# Patient Record
Sex: Male | Born: 1940
Health system: Southern US, Community
[De-identification: ages and names within clinical notes are randomized; demographics above are authoritative.]

## PROBLEM LIST (undated history)

## (undated) DIAGNOSIS — I10 Essential (primary) hypertension: Secondary | ICD-10-CM

## (undated) DIAGNOSIS — Z9889 Other specified postprocedural states: Secondary | ICD-10-CM

## (undated) DIAGNOSIS — IMO0001 Reserved for inherently not codable concepts without codable children: Secondary | ICD-10-CM

## (undated) DIAGNOSIS — E785 Hyperlipidemia, unspecified: Secondary | ICD-10-CM

## (undated) DIAGNOSIS — J449 Chronic obstructive pulmonary disease, unspecified: Secondary | ICD-10-CM

## (undated) HISTORY — DX: Essential (primary) hypertension: I10

## (undated) HISTORY — DX: Other specified postprocedural states: Z98.890

## (undated) HISTORY — DX: Hyperlipidemia, unspecified: E78.5

## (undated) HISTORY — DX: Reserved for inherently not codable concepts without codable children: IMO0001

---

## 2007-10-20 ENCOUNTER — Ambulatory Visit: Payer: Self-pay | Admitting: Family Medicine

## 2007-10-20 ENCOUNTER — Encounter: Admission: RE | Admit: 2007-10-20 | Discharge: 2007-10-20 | Payer: Self-pay | Admitting: Family Medicine

## 2007-10-20 DIAGNOSIS — H9190 Unspecified hearing loss, unspecified ear: Secondary | ICD-10-CM | POA: Insufficient documentation

## 2007-10-20 DIAGNOSIS — J441 Chronic obstructive pulmonary disease with (acute) exacerbation: Secondary | ICD-10-CM

## 2007-10-20 DIAGNOSIS — I10 Essential (primary) hypertension: Secondary | ICD-10-CM

## 2007-10-24 ENCOUNTER — Encounter: Payer: Self-pay | Admitting: Family Medicine

## 2007-10-24 LAB — CONVERTED CEMR LAB
Alkaline Phosphatase: 88 units/L (ref 39–117)
BUN: 13 mg/dL (ref 6–23)
Glucose, Bld: 90 mg/dL (ref 70–99)
HDL goal, serum: 40 mg/dL
HDL: 71 mg/dL (ref >39)
LDL Cholesterol: 119 mg/dL — ABNORMAL HIGH (ref 0–99)
Total Bilirubin: 0.4 mg/dL (ref 0.3–1.2)
Total CHOL/HDL Ratio: 3.1
Triglycerides: 140 mg/dL (ref <150)
VLDL: 28 mg/dL (ref 0–40)

## 2007-11-06 ENCOUNTER — Ambulatory Visit: Payer: Self-pay | Admitting: Family Medicine

## 2007-12-26 ENCOUNTER — Ambulatory Visit: Payer: Self-pay | Admitting: Family Medicine

## 2007-12-26 DIAGNOSIS — F17200 Nicotine dependence, unspecified, uncomplicated: Secondary | ICD-10-CM

## 2008-01-29 ENCOUNTER — Ambulatory Visit: Payer: Self-pay | Admitting: Family Medicine

## 2008-04-11 ENCOUNTER — Ambulatory Visit: Payer: Self-pay | Admitting: Family Medicine

## 2008-05-30 ENCOUNTER — Ambulatory Visit: Payer: Self-pay | Admitting: Family Medicine

## 2008-05-31 ENCOUNTER — Encounter: Payer: Self-pay | Admitting: Family Medicine

## 2008-05-31 LAB — CONVERTED CEMR LAB
AST: 16 units/L (ref 0–37)
BUN: 12 mg/dL (ref 6–23)
Basophils Relative: 1 % (ref 0–1)
Calcium: 8.9 mg/dL (ref 8.4–10.5)
Chloride: 99 meq/L (ref 96–112)
Creatinine, Ser: 0.8 mg/dL (ref 0.40–1.50)
Hemoglobin: 15 g/dL (ref 13.0–17.0)
MCHC: 32.3 g/dL (ref 30.0–36.0)
MCV: 90.8 fL (ref 78.0–100.0)
Monocytes Absolute: 0.8 10*3/uL (ref 0.1–1.0)
Monocytes Relative: 14 % — ABNORMAL HIGH (ref 3–12)
Neutro Abs: 2.7 10*3/uL (ref 1.7–7.7)
RBC: 5.11 M/uL (ref 4.22–5.81)

## 2008-07-04 ENCOUNTER — Ambulatory Visit: Payer: Self-pay | Admitting: Family Medicine

## 2008-08-05 ENCOUNTER — Ambulatory Visit: Payer: Self-pay | Admitting: Family Medicine

## 2008-11-19 ENCOUNTER — Telehealth: Payer: Self-pay | Admitting: Family Medicine

## 2008-12-05 ENCOUNTER — Ambulatory Visit: Payer: Self-pay | Admitting: Family Medicine

## 2008-12-05 DIAGNOSIS — R413 Other amnesia: Secondary | ICD-10-CM | POA: Insufficient documentation

## 2008-12-06 LAB — CONVERTED CEMR LAB
CO2: 24 meq/L (ref 19–32)
Chloride: 104 meq/L (ref 96–112)
Creatinine, Ser: 0.91 mg/dL (ref 0.40–1.50)
HDL: 71 mg/dL (ref >39)
LDL Cholesterol: 114 mg/dL — ABNORMAL HIGH (ref 0–99)
Potassium: 4.8 meq/L (ref 3.5–5.3)
Sodium: 141 meq/L (ref 135–145)
Total CHOL/HDL Ratio: 2.9
VLDL: 20 mg/dL (ref 0–40)

## 2009-01-31 ENCOUNTER — Telehealth (INDEPENDENT_AMBULATORY_CARE_PROVIDER_SITE_OTHER): Payer: Self-pay | Admitting: *Deleted

## 2009-02-18 ENCOUNTER — Telehealth (INDEPENDENT_AMBULATORY_CARE_PROVIDER_SITE_OTHER): Payer: Self-pay | Admitting: *Deleted

## 2009-04-02 ENCOUNTER — Ambulatory Visit: Payer: Self-pay | Admitting: Family Medicine

## 2009-04-08 ENCOUNTER — Encounter: Admission: RE | Admit: 2009-04-08 | Discharge: 2009-04-08 | Payer: Self-pay | Admitting: Family Medicine

## 2009-04-08 ENCOUNTER — Encounter: Payer: Self-pay | Admitting: Family Medicine

## 2009-06-01 ENCOUNTER — Encounter: Payer: Self-pay | Admitting: Family Medicine

## 2009-06-04 ENCOUNTER — Encounter: Payer: Self-pay | Admitting: Family Medicine

## 2009-06-09 ENCOUNTER — Ambulatory Visit: Payer: Self-pay | Admitting: Family Medicine

## 2009-10-09 ENCOUNTER — Encounter: Payer: Self-pay | Admitting: Family Medicine

## 2009-10-23 ENCOUNTER — Ambulatory Visit: Payer: Self-pay | Admitting: Family Medicine

## 2009-12-31 ENCOUNTER — Ambulatory Visit: Payer: Self-pay | Admitting: Family Medicine

## 2010-01-01 LAB — CONVERTED CEMR LAB
ALT: 10 units/L (ref 0–53)
AST: 19 units/L (ref 0–37)
CO2: 27 meq/L (ref 19–32)
Calcium: 9.1 mg/dL (ref 8.4–10.5)
Chloride: 98 meq/L (ref 96–112)
Magnesium: 2.1 mg/dL (ref 1.5–2.5)
Platelets: 159 10*3/uL (ref 150–400)
Potassium: 4.7 meq/L (ref 3.5–5.3)
Sodium: 135 meq/L (ref 135–145)
Total Protein: 6.9 g/dL (ref 6.0–8.3)
WBC: 5.3 10*3/uL (ref 4.0–10.5)

## 2010-03-12 ENCOUNTER — Telehealth (INDEPENDENT_AMBULATORY_CARE_PROVIDER_SITE_OTHER): Payer: Self-pay | Admitting: *Deleted

## 2010-04-15 ENCOUNTER — Ambulatory Visit: Payer: Self-pay | Admitting: Family Medicine

## 2010-04-15 ENCOUNTER — Telehealth (INDEPENDENT_AMBULATORY_CARE_PROVIDER_SITE_OTHER): Payer: Self-pay | Admitting: *Deleted

## 2010-04-15 DIAGNOSIS — E785 Hyperlipidemia, unspecified: Secondary | ICD-10-CM | POA: Insufficient documentation

## 2010-07-15 ENCOUNTER — Ambulatory Visit: Payer: Self-pay | Admitting: Family Medicine

## 2010-07-15 DIAGNOSIS — J309 Allergic rhinitis, unspecified: Secondary | ICD-10-CM | POA: Insufficient documentation

## 2010-07-17 ENCOUNTER — Encounter: Payer: Self-pay | Admitting: Family Medicine

## 2010-07-28 ENCOUNTER — Ambulatory Visit: Payer: Self-pay | Admitting: Family Medicine

## 2010-09-29 NOTE — Assessment & Plan Note (Signed)
Summary: COPD, hypertension   Vital Signs:  Patient profile:   70 year old male Height:      71.5 inches Weight:      136 pounds BMI:     18.77 Pulse rate:   67 / minute BP sitting:   159 / 83  (right arm) Cuff size:   regular  Vitals Entered By: Avon Gully CMA, Duncan Dull) (July 28, 2010 10:35 AM) CC: Hosp f/u   Primary Care Provider:  Linford Arnold, C  CC:  Hosp f/u.  History of Present Illness: is 70 year old male he went to emergency department for shortness of breath.  He was diagnosed with an acute COPD exacerbation.  He was told he likely had early bronchitis.  He was started on Levaquin and a steroid.  His regular COPD medications were continued.  Other medications were adjusted.  His blood pressure with a little elevated there as well.  He says overall he is feeling much better.  He still feels his allergies are a problem.  He is not nearly as shortness of breath.  He wonders if he would be a candidate for lung transplant.  Current Medications (verified): 1)  Albuterol Sulfate (2.5 Mg/68ml) 0.083%  Nebu (Albuterol Sulfate) .... Use in Nebulizer Machine Once A Day 2)  Lisinopril 20 Mg Tabs (Lisinopril) .... Take 1 Tablet By Mouth Two Times A Day 3)  Proair Hfa 108 (90 Base) Mcg/act  Aers (Albuterol Sulfate) .... 2 Puffs Inhaled Two Times A Day As Needed 4)  Advair Diskus 250-50 Mcg/dose Misc (Fluticasone-Salmeterol) .Marland Kitchen.. 1 Puff Inhaled Two Times A Day 5)  Spiriva Handihaler 18 Mcg  Caps (Tiotropium Bromide Monohydrate) .... Inhale One Puff Daily 6)  Metoprolol Tartrate 50 Mg Tabs (Metoprolol Tartrate) .... Take 1 Tablet By Mouth Two Times A Day  Allergies (verified): No Known Drug Allergies  Comments:  Nurse/Medical Assistant: The patient's medications and allergies were reviewed with the patient and were updated in the Medication and Allergy Lists. Avon Gully CMA, Duncan Dull) (July 28, 2010 10:36 AM)  Physical Exam  General:  Well-developed,well-nourished,in no  acute distress; alert,appropriate and cooperative throughout examination Head:  Normocephalic and atraumatic without obvious abnormalities. No apparent alopecia or balding. Eyes:  No corneal or conjunctival inflammation noted. EOMI. Perrla.  Ears:  External ear exam shows no significant lesions or deformities.  Otoscopic examination reveals clear canals, tympanic membranes are intact bilaterally without bulging, retraction, inflammation or discharge. Hearing is grossly normal bilaterally. Nose:  External nasal examination shows no deformity or inflammation.  Mouth:  Oral mucosa and oropharynx without lesions or exudates.  Teeth in good repair. Lungs:  coarse breath sounds diffusely.  No wheezing today. Heart:  Normal rate and regular rhythm. S1 and S2 normal without gallop, murmur, click, rub or other extra sounds. Skin:  no rashes.   Cervical Nodes:  No lymphadenopathy noted Psych:  Cognition and judgment appear intact. Alert and cooperative with normal attention span and concentration. No apparent delusions, illusions, hallucinations   Impression & Recommendations:  Problem # 1:  COPD (ICD-496) I think he is improving from his exacerbation.  His lung exam really is at baseline today.  Continue antibiotics and complete his steroid regimen.  Continue his Advair and sprays as well as his p.r.n. albuterol.  Also continue his allergy medications.  He is due for some blood work but says he is not fasting today and would like to do some other time.I did review his ED visit summary. His updated medication list for this  problem includes:    Albuterol Sulfate (2.5 Mg/43ml) 0.083% Nebu (Albuterol sulfate) ..... Use in nebulizer machine once a day    Proair Hfa 108 (90 Base) Mcg/act Aers (Albuterol sulfate) .Marland Kitchen... 2 puffs inhaled two times a day as needed    Advair Diskus 250-50 Mcg/dose Misc (Fluticasone-salmeterol) .Marland Kitchen... 1 puff inhaled two times a day    Spiriva Handihaler 18 Mcg Caps (Tiotropium bromide  monohydrate) ..... Inhale one puff daily  Problem # 2:  HYPERTENSION, BENIGN (ICD-401.1) his blood pressure is not well-controlled today.  I will a low dose of hydralazine to his current regimen.  Have him follow-up in one month to recheck his blood pressure.  We can likely due his blood work at that time. His updated medication list for this problem includes:    Lisinopril 20 Mg Tabs (Lisinopril) .Marland Kitchen... Take 1 tablet by mouth two times a day    Metoprolol Tartrate 50 Mg Tabs (Metoprolol tartrate) .Marland Kitchen... Take 1 tablet by mouth two times a day    Hydralazine Hcl 25 Mg Tabs (Hydralazine hcl) .Marland Kitchen... Take 1 tablet by mouth once a day  Complete Medication List: 1)  Albuterol Sulfate (2.5 Mg/25ml) 0.083% Nebu (Albuterol sulfate) .... Use in nebulizer machine once a day 2)  Lisinopril 20 Mg Tabs (Lisinopril) .... Take 1 tablet by mouth two times a day 3)  Proair Hfa 108 (90 Base) Mcg/act Aers (Albuterol sulfate) .... 2 puffs inhaled two times a day as needed 4)  Advair Diskus 250-50 Mcg/dose Misc (Fluticasone-salmeterol) .Marland Kitchen.. 1 puff inhaled two times a day 5)  Spiriva Handihaler 18 Mcg Caps (Tiotropium bromide monohydrate) .... Inhale one puff daily 6)  Metoprolol Tartrate 50 Mg Tabs (Metoprolol tartrate) .... Take 1 tablet by mouth two times a day 7)  Hydralazine Hcl 25 Mg Tabs (Hydralazine hcl) .... Take 1 tablet by mouth once a day  Patient Instructions: 1)  Please schedule a follow-up appointment in 1 month for blood pressure.  Prescriptions: HYDRALAZINE HCL 25 MG TABS (HYDRALAZINE HCL) Take 1 tablet by mouth once a day  #30 x 1   Entered and Authorized by:   Nani Gasser MD   Signed by:   Nani Gasser MD on 07/28/2010   Method used:   Electronically to        [facility] # [phone]* (retail)       [address]       [place], [place]  [address]       Ph: [phone]       Fax: [phone]   RxID:   [id number]    Orders Added: 1)  Est. Patient  Level IV [36644]

## 2010-09-29 NOTE — Progress Notes (Signed)
----   Converted from flag ---- ---- 04/15/2010 12:24 PM, Nani Gasser MD wrote: Call pt and let him know he is due for lab. I printed them off. Sorry I din't give them to him this AM. ------------------------------  8/17/111:18 called pt and left a message on vm

## 2010-09-29 NOTE — Assessment & Plan Note (Signed)
Summary: AR, HTN   Vital Signs:  Patient profile:   70 year old male Height:      71.5 inches Weight:      131 pounds Pulse rate:   80 / minute BP sitting:   140 / 82  (right arm) Cuff size:   regular  Vitals Entered By: Avon Gully CMA, Duncan Dull) (July 15, 2010 10:41 AM) CC: f/u BP and pneumo vaccine, runny nose and itchy nose   Primary Care Provider:  Linford Arnold, C  CC:  f/u BP and pneumo vaccine and runny nose and itchy nose.  History of Present Illness: f/u BP and pneumo vaccine, runny nose and itchy nose. Taking an OTC allergy medicine and helps some.  He is not sure of the name. Says it is a very small white pill.  Sxs x 2 weeks.  No fever.     Current Medications (verified): 1)  Albuterol Sulfate (2.5 Mg/85ml) 0.083%  Nebu (Albuterol Sulfate) .... Use in Nebulizer Machine Once A Day 2)  Lisinopril 20 Mg Tabs (Lisinopril) .... Take 1 Tablet By Mouth Two Times A Day 3)  Proair Hfa 108 (90 Base) Mcg/act  Aers (Albuterol Sulfate) .... 2 Puffs Inhaled Two Times A Day As Needed 4)  Advair Diskus 250-50 Mcg/dose Misc (Fluticasone-Salmeterol) .Marland Kitchen.. 1 Puff Inhaled Two Times A Day 5)  Spiriva Handihaler 18 Mcg  Caps (Tiotropium Bromide Monohydrate) .... Inhale One Puff Daily 6)  Metoprolol Tartrate 50 Mg Tabs (Metoprolol Tartrate) .... Take 1 Tablet By Mouth Two Times A Day  Allergies (verified): No Known Drug Allergies  Comments:  Nurse/Medical Assistant: The patient's medications and allergies were reviewed with the patient and were updated in the Medication and Allergy Lists. Avon Gully CMA, Duncan Dull) (July 15, 2010 10:42 AM)  Past History:  Social History: Last updated: 10/20/2007 REtired.  Completed high school.  Uses a wood burning stove.  Current Smoker Alcohol use-no Drug use-no Regular exercise-no  Physical Exam  General:  Well-developed,well-nourished,in no acute distress; alert,appropriate and cooperative throughout examination Lungs:  Normal  respiratory effort, chest expands symmetrically. Corse BS bilar. No wheezing or rhonchi.  Heart:  Normal rate and regular rhythm. S1 and S2 normal without gallop, murmur, click, rub or other extra sounds. Skin:  no rashes.   Cervical Nodes:  No lymphadenopathy noted Psych:  Cognition and judgment appear intact. Alert and cooperative with normal attention span and concentration. No apparent delusions, illusions, hallucinations   Impression & Recommendations:  Problem # 1:  ALLERGIC RHINITIS (ICD-477.9) Continue med from [employer] and given a sample of astepro. Start with once a day. Can inc to two times a day  Call if he has breathing difficuty.  Discussed use of allergy medications and environmental measures.   Problem # 2:  HYPERTENSION, BENIGN (ICD-401.1) AT goal based on his age.  His updated medication list for this problem includes:    Lisinopril 20 Mg Tabs (Lisinopril) .Marland Kitchen... Take 1 tablet by mouth two times a day    Metoprolol Tartrate 50 Mg Tabs (Metoprolol tartrate) .Marland Kitchen... Take 1 tablet by mouth two times a day  BP today: 140/82 Prior BP: 141/73 (04/15/2010)  Prior 10 Yr Risk Heart Disease: 33 % (04/15/2010)  Labs Reviewed: K+: 4.7 (12/31/2009) Creat: : 0.80 (12/31/2009)   Chol: 205 (12/05/2008)   HDL: 71 (12/05/2008)   LDL: 114 (12/05/2008)   TG: 99 (12/05/2008)  Complete Medication List: 1)  Albuterol Sulfate (2.5 Mg/33ml) 0.083% Nebu (Albuterol sulfate) .... Use in nebulizer machine once a day  2)  Lisinopril 20 Mg Tabs (Lisinopril) .... Take 1 tablet by mouth two times a day 3)  Proair Hfa 108 (90 Base) Mcg/act Aers (Albuterol sulfate) .... 2 puffs inhaled two times a day as needed 4)  Advair Diskus 250-50 Mcg/dose Misc (Fluticasone-salmeterol) .Marland Kitchen.. 1 puff inhaled two times a day 5)  Spiriva Handihaler 18 Mcg Caps (Tiotropium bromide monohydrate) .... Inhale one puff daily 6)  Metoprolol Tartrate 50 Mg Tabs (Metoprolol tartrate) .... Take 1 tablet by mouth two times a  day  Other Orders: Pneumococcal Vaccine (60454) Admin 1st Vaccine (09811)  Patient Instructions: 1)  Astepro : 1 spray in each nostril once a day. Keep taking the allergy pill from [facility] too.   2)  If not helping your allergies then let me know.   Contraindications/Deferment of Procedures/Staging:    Test/Procedure: FLU VAX    Reason for deferment: patient declined    Orders Added: 1)  Pneumococcal Vaccine [90732] 2)  Admin 1st Vaccine [90471] 3)  Est. Patient Level III [91478]   Immunizations Administered:  Pneumonia Vaccine:    Vaccine Type: Pneumovax (Medicare)    Site: left deltoid    Mfr: Merck    Dose: 0.5 ml    Route: IM    Given by: Sue Lush McCrimmon CMA, (AAMA)    Exp. Date: 11/09/2011    Lot #: [id number]    VIS given: 08/04/09 version given July 15, 2010.   Immunizations Administered:  Pneumonia Vaccine:    Vaccine Type: Pneumovax (Medicare)    Site: left deltoid    Mfr: Merck    Dose: 0.5 ml    Route: IM    Given by: Sue Lush McCrimmon CMA, (AAMA)    Exp. Date: 11/09/2011    Lot #: [id number]    VIS given: 08/04/09 version given July 15, 2010.  Appended Document: AR, HTN     Contraindications/Deferment of Procedures/Staging:    Test/Procedure: FLU VAX    Reason for deferment: patient declined

## 2010-09-29 NOTE — Letter (Signed)
Summary: [facility]  [facility]   Imported By: Maryln Gottron 08/06/2010 14:57:55  _____________________________________________________________________  External Attachment:    Type:   Image     Comment:   External Document

## 2010-09-29 NOTE — Assessment & Plan Note (Signed)
Summary: FU HTN, COPD   Vital Signs:  Patient profile:   70 year old male Height:      71.5 inches Weight:      130 pounds Pulse rate:   62 / minute BP sitting:   141 / 73  (left arm) Cuff size:   regular  Vitals Entered By: Avon Gully CMA, Duncan Dull) (April 15, 2010 9:55 AM) CC: f/u BP, Hypertension Management   Primary Care Provider:  Linford Arnold, C  CC:  f/u BP and Hypertension Management.  History of Present Illness: Says he thinks Adviar is causing loose stools but not sure. Says he read it on teh side effect profil. Says his COPD has been under control lately. Has had some flares with the heat this summer.  Hypertension History:      He denies headache, chest pain, palpitations, dyspnea with exertion, orthopnea, PND, peripheral edema, visual symptoms, neurologic problems, syncope, and side effects from treatment.  He notes no problems with any antihypertensive medication side effects.        Positive major cardiovascular risk factors include male age 56 years old or older, hyperlipidemia, hypertension, and current tobacco user.  Negative major cardiovascular risk factors include no history of diabetes and negative family history for ischemic heart disease.        Further assessment for target organ damage reveals no history of ASHD, stroke/TIA, or peripheral vascular disease.     Current Medications (verified): 1)  Albuterol Sulfate (2.5 Mg/25ml) 0.083%  Nebu (Albuterol Sulfate) .... Use in Nebulizer Machine Once A Day 2)  Lisinopril 20 Mg Tabs (Lisinopril) .... Take 1 Tablet By Mouth Two Times A Day 3)  Proair Hfa 108 (90 Base) Mcg/act  Aers (Albuterol Sulfate) .... 2 Puffs Inhaled Two Times A Day As Needed 4)  Advair Diskus 250-50 Mcg/dose Misc (Fluticasone-Salmeterol) .Marland Kitchen.. 1 Puff Inhaled Two Times A Day 5)  Spiriva Handihaler 18 Mcg  Caps (Tiotropium Bromide Monohydrate) .... Inhale One Puff Daily 6)  Metoprolol Tartrate 50 Mg Tabs (Metoprolol Tartrate) .... Take 1 Tablet  By Mouth Two Times A Day  Allergies (verified): No Known Drug Allergies  Comments:  Nurse/Medical Assistant: The patient's medications and allergies were reviewed with the patient and were updated in the Medication and Allergy Lists. Avon Gully CMA, Duncan Dull) (April 15, 2010 9:56 AM)  Physical Exam  General:  Well-developed,well-nourished,in no acute distress; alert,appropriate and cooperative throughout examination. Thin frame Lungs:  Normal respiratory effort, chest expands symmetrically. Lungs are clear to auscultation, no crackles or wheezes. Heart:  Normal rate and regular rhythm. S1 and S2 normal without gallop, murmur, click, rub or other extra sounds. Psych:  Cognition and judgment appear intact. Alert and cooperative with normal attention span and concentration. No apparent delusions, illusions, hallucinations   Impression & Recommendations:  Problem # 1:  HYPERTENSION, BENIGN (ICD-401.1)  IMproved ont eh higher dose of metoprolol  This BP is acceptable based on his age.  f/u in 3 months His updated medication list for this problem includes:    Lisinopril 20 Mg Tabs (Lisinopril) .Marland Kitchen... Take 1 tablet by mouth two times a day    Metoprolol Tartrate 50 Mg Tabs (Metoprolol tartrate) .Marland Kitchen... Take 1 tablet by mouth two times a day  BP today: 141/73 Prior BP: 152/69 (12/31/2009)  Prior 10 Yr Risk Heart Disease: 22 % (12/05/2008)  Labs Reviewed: K+: 4.7 (12/31/2009) Creat: : 0.80 (12/31/2009)   Chol: 205 (12/05/2008)   HDL: 71 (12/05/2008)   LDL: 114 (12/05/2008)   TG:  99 (12/05/2008)  Orders: T-Lipid Profile ([address]-60454) T-Comprehensive Metabolic Panel ([address]11-91478)  Problem # 2:  COPD (ICD-496) Sample of symbicort given to use instead of Adviar to see if helps his bowel situation. If not also consider other causes of his diarrhea. f/uin teh fall for flu vaccine.  His updated medication list for this problem includes:    Albuterol Sulfate (2.5 Mg/38ml) 0.083% Nebu  (Albuterol sulfate) ..... Use in nebulizer machine once a day    Proair Hfa 108 (90 Base) Mcg/act Aers (Albuterol sulfate) .Marland Kitchen... 2 puffs inhaled two times a day as needed    Advair Diskus 250-50 Mcg/dose Misc (Fluticasone-salmeterol) .Marland Kitchen... 1 puff inhaled two times a day    Spiriva Handihaler 18 Mcg Caps (Tiotropium bromide monohydrate) ..... Inhale one puff daily  Complete Medication List: 1)  Albuterol Sulfate (2.5 Mg/15ml) 0.083% Nebu (Albuterol sulfate) .... Use in nebulizer machine once a day 2)  Lisinopril 20 Mg Tabs (Lisinopril) .... Take 1 tablet by mouth two times a day 3)  Proair Hfa 108 (90 Base) Mcg/act Aers (Albuterol sulfate) .... 2 puffs inhaled two times a day as needed 4)  Advair Diskus 250-50 Mcg/dose Misc (Fluticasone-salmeterol) .Marland Kitchen.. 1 puff inhaled two times a day 5)  Spiriva Handihaler 18 Mcg Caps (Tiotropium bromide monohydrate) .... Inhale one puff daily 6)  Metoprolol Tartrate 50 Mg Tabs (Metoprolol tartrate) .... Take 1 tablet by mouth two times a day  Hypertension Assessment/Plan:      The patient's hypertensive risk group is category B: At least one risk factor (excluding diabetes) with no target organ damage.  His calculated 10 year risk of coronary heart disease is 33 %.  Today's blood pressure is 141/73.    Patient Instructions: 1)  Stop the the Advair and start the symbicort. 1 puff inhaled two times a day  2)  Return in october or November for the flu shot and to recheck your blood pressure.

## 2010-09-29 NOTE — Assessment & Plan Note (Signed)
Summary: Elevated BP last 3 weeks.    Vital Signs:  Patient profile:   70 year old male Height:      71.5 inches Weight:      137 pounds O2 Sat:      95 % on Room air Pulse rate:   85 / minute BP sitting:   152 / 69  (left arm) Cuff size:   regular  Vitals Entered By: Kathlene November (Dec 31, 2009 10:23 AM)  O2 Flow:  Room air CC: states BP has been elevated last 3 weeks   Primary Care Provider:  Linford Arnold, C  CC:  states BP has been elevated last 3 weeks.  History of Present Illness: states BP has been elevated last 3 weeks. Not sure why. No changes in his medication. Only change in his diet is has been drinking 2 Ensure a day. No recent change in his COPD.  NOrmally BP is well controlled. He wants to know the normal range for BP adn HR for a man his age.    Current Medications (verified): 1)  Albuterol Sulfate (2.5 Mg/3ml) 0.083%  Nebu (Albuterol Sulfate) .... Use in Nebulizer Machine Once A Day 2)  Lisinopril 20 Mg Tabs (Lisinopril) .... Take 1 Tablet By Mouth Two Times A Day 3)  Proair Hfa 108 (90 Base) Mcg/act  Aers (Albuterol Sulfate) .... 2 Puffs Inhaled Two Times A Day As Needed 4)  Advair Diskus 250-50 Mcg/dose Misc (Fluticasone-Salmeterol) .Marland Kitchen.. 1 Puff Inhaled Two Times A Day 5)  Spiriva Handihaler 18 Mcg  Caps (Tiotropium Bromide Monohydrate) .... Inhale One Puff Daily 6)  Metoprolol Tartrate 25 Mg Tabs (Metoprolol Tartrate) .... Take 1 Tablet By Mouth Two Times A Day  Allergies (verified): No Known Drug Allergies  Comments:  Nurse/Medical Assistant: The patient's medications and allergies were reviewed with the patient and were updated in the Medication and Allergy Lists. Kathlene November (Dec 31, 2009 10:24 AM)  Physical Exam  General:  Well-developed,well-nourished,in no acute distress; alert,appropriate and cooperative throughout examination Head:  Normocephalic and atraumatic without obvious abnormalities. No apparent alopecia or balding. Lungs:  Normal respiratory  effort, chest expands symmetrically. Lungs are clear to auscultation, no crackles or wheezes. Heart:  Normal rate and regular rhythm. S1 and S2 normal without gallop, murmur, click, rub or other extra sounds. No carotid or abdominal bruits.  Abdomen:  Bowel sounds positive,abdomen soft and non-tender without masses, organomegaly or hernias noted. Skin:  no rashes.   Psych:  Cognition and judgment appear intact. Alert and cooperative with normal attention span and concentration. No apparent delusions, illusions, hallucinations   Impression & Recommendations:  Problem # 1:  HYPERTENSION, BENIGN (ICD-401.1) After reviewed his chart , I did see that his HCT was stopped in the hospital so this is the likelyh cause of his elevated. It was stopped because he was midly dehydrated.  Will increase his metoprolol since his HR is in the 80-90s most of the time. F/U for BP check in one month.   EKG shows NSR with rate 93 bpm with rightward axis deviation. He denies any CP.   His updated medication list for this problem includes:    Lisinopril 20 Mg Tabs (Lisinopril) .Marland Kitchen... Take 1 tablet by mouth two times a day    Metoprolol Tartrate 50 Mg Tabs (Metoprolol tartrate) .Marland Kitchen... Take 1 tablet by mouth two times a day  Orders: Prescription Created Electronically [id number]) T-Comprehensive Metabolic Panel [phone]) T-TSH [id number]) EKG w/ Interpretation (93000)  Complete Medication List: 1)  Albuterol Sulfate (2.5 Mg/49ml) 0.083% Nebu (Albuterol sulfate) .... Use in nebulizer machine once a day 2)  Lisinopril 20 Mg Tabs (Lisinopril) .... Take 1 tablet by mouth two times a day 3)  Proair Hfa 108 (90 Base) Mcg/act Aers (Albuterol sulfate) .... 2 puffs inhaled two times a day as needed 4)  Advair Diskus 250-50 Mcg/dose Misc (Fluticasone-salmeterol) .Marland Kitchen.. 1 puff inhaled two times a day 5)  Spiriva Handihaler 18 Mcg Caps (Tiotropium bromide monohydrate) .... Inhale one puff daily 6)  Metoprolol Tartrate 50 Mg Tabs  (Metoprolol tartrate) .... Take 1 tablet by mouth two times a day  Other Orders: T-CBC No Diff ([id number]) T-Vitamin B12 ([id number]) T-Magnesium ([id number])  Patient Instructions: 1)  Normal blood pressure for a man your age is 120-140/70-90. 2)  Normal Heart rate is between 60-100 beats per minute. 3)  Normal breathing rate is 12-20 breaths per minutes.  4)  I sent over a new prescription for your metoprolol as I have increased the dose.  5)  Please schedule a follow-up appointment in 1 month to recheck your blood pressure. 6)  You are due for labs work. Has been one year. Please go today for that.   Prescriptions: METOPROLOL TARTRATE 50 MG TABS (METOPROLOL TARTRATE) Take 1 tablet by mouth two times a day  #60 x 4   Entered and Authorized by:   Nani Gasser MD   Signed by:   Nani Gasser MD on 12/31/2009   Method used:   Electronically to        [facility] # [phone]* (retail)       [address]       [place], [place]  [address]       Ph: [phone]       Fax: [phone]   RxID:   [id number]

## 2010-09-29 NOTE — Progress Notes (Signed)
Summary: refill request  Phone Note Refill Request Message from:  Patient on March 12, 2010 9:00 AM  Refills Requested: Medication #1:  PROAIR HFA 108 (90 BASE) MCG/ACT  AERS 2 puffs inhaled two times a day as needed   Dosage confirmed as above?Dosage Confirmed Please call in to [facility] in [facility] [address]  Initial call taken by: Michaelle Copas,  March 12, 2010 9:03 AM    Prescriptions: PROAIR HFA 108 (90 BASE) MCG/ACT  AERS (ALBUTEROL SULFATE) 2 puffs inhaled two times a day as needed  #2 x 2   Entered by:   Payton Spark CMA   Authorized by:   Nani Gasser MD   Signed by:   Payton Spark CMA on 03/12/2010   Method used:   Electronically to        [facility] # [phone]* (retail)       [address]       [place], [place]  [address]       Ph: [phone]       Fax: [phone]   RxID:   [id number]

## 2010-09-29 NOTE — Assessment & Plan Note (Signed)
Summary: FU FROM HOSPITAL   Vital Signs:  Patient profile:   70 year old male Height:      71.5 inches Weight:      137.04 pounds BMI:     18.91 O2 Sat:      100 % on Room air Temp:     97.1 degrees F oral Pulse rate:   94 / minute Pulse rhythm:   regular BP sitting:   129 / 75 Cuff size:   regular  Vitals Entered By: Kern Reap CMA Duncan Dull) (October 23, 2009 10:47 AM)  O2 Flow:  Room air CC: [facility] Is Patient Diabetic? No Pain Assessment Patient in pain? no      Comments patient is taking lisinopril 20 two times a day and has concerns with this rx   Primary Care Provider:  Linford Arnold, C  CC:  [facility].  History of Present Illness: Was in the hosptial for the flu in early FEb for the "flu".  Says he was admitted to [facility] for 3 days.  Says feels much better.  Was given new rx for lisinopril as the HCTZ was stoppped.   BPS have been really good on the increased Lisinopril to two times a day. Has quit smoking since October 2011. Donig well with this. Says he thinks he may live to 90. REviewed [facility] form [facility]> Was admitted for viral gastroenteritis and dehydration. HCTZ stopped secondary to hypnatremi and lisinopril increased.   Allergies (verified): No Known Drug Allergies  Physical Exam  General:  Well-developed,well-nourished,in no acute distress; alert,appropriate and cooperative throughout examination Head:  Normocephalic and atraumatic without obvious abnormalities. No apparent alopecia or balding. Lungs:  Normal respiratory effort, chest expands symmetrically. Lungs are clear to auscultation, no crackles or wheezes. Heart:  Normal rate and regular rhythm. S1 and S2 normal without gallop, murmur, click, rub or other extra sounds. Skin:  no rashes.   Cervical Nodes:  No lymphadenopathy noted Psych:  Cognition and judgment appear intact. Alert and cooperative with normal attention span and concentration. No apparent delusions,  illusions, hallucinations   Impression & Recommendations:  Problem # 1:  HYPERTENSION, BENIGN (ICD-401.1) BP looks great today. Will continue current regime and f/u in 4 months.  He seems to be back to baseline.  His updated medication list for this problem includes:    Lisinopril 20 Mg Tabs (Lisinopril) .Marland Kitchen... Take 1 tablet by mouth two times a day    Metoprolol Tartrate 25 Mg Tabs (Metoprolol tartrate) .Marland Kitchen... Take 1 tablet by mouth two times a day  Problem # 2:  COPD (ICD-496) At baseline and doing well. Samples of spiriva given.  His updated medication list for this problem includes:    Albuterol Sulfate (2.5 Mg/20ml) 0.083% Nebu (Albuterol sulfate) ..... Use in nebulizer machine once a day    Proair Hfa 108 (90 Base) Mcg/act Aers (Albuterol sulfate) .Marland Kitchen... 2 puffs inhaled two times a day as needed    Advair Diskus 250-50 Mcg/dose Misc (Fluticasone-salmeterol) .Marland Kitchen... 1 puff inhaled two times a day    Spiriva Handihaler 18 Mcg Caps (Tiotropium bromide monohydrate) ..... Inhale one puff daily  Complete Medication List: 1)  Albuterol Sulfate (2.5 Mg/48ml) 0.083% Nebu (Albuterol sulfate) .... Use in nebulizer machine once a day 2)  Lisinopril 20 Mg Tabs (Lisinopril) .... Take 1 tablet by mouth two times a day 3)  Proair Hfa 108 (90 Base) Mcg/act Aers (Albuterol sulfate) .... 2 puffs inhaled two times a day as needed 4)  Advair Diskus 250-50 Mcg/dose Misc (Fluticasone-salmeterol) .Marland Kitchen.. 1 puff inhaled two times a day 5)  Spiriva Handihaler 18 Mcg Caps (Tiotropium bromide monohydrate) .... Inhale one puff daily 6)  Metoprolol Tartrate 25 Mg Tabs (Metoprolol tartrate) .... Take 1 tablet by mouth two times a day

## 2010-09-29 NOTE — Letter (Signed)
Summary: [facility]   Imported By: Lanelle Bal 10/28/2009 13:05:05  _____________________________________________________________________  External Attachment:    Type:   Image     Comment:   External Document

## 2010-10-06 ENCOUNTER — Ambulatory Visit: Payer: Self-pay | Admitting: Family Medicine

## 2010-10-08 ENCOUNTER — Ambulatory Visit (INDEPENDENT_AMBULATORY_CARE_PROVIDER_SITE_OTHER): Payer: Medicare Other | Admitting: Family Medicine

## 2010-10-08 ENCOUNTER — Encounter: Payer: Self-pay | Admitting: Family Medicine

## 2010-10-08 DIAGNOSIS — F341 Dysthymic disorder: Secondary | ICD-10-CM

## 2010-10-08 DIAGNOSIS — I10 Essential (primary) hypertension: Secondary | ICD-10-CM

## 2010-10-15 NOTE — Assessment & Plan Note (Signed)
Summary: HTN, depression   Vital Signs:  Patient profile:   70 year old male Height:      71.5 inches Weight:      132 pounds Pulse rate:   68 / minute BP sitting:   151 / 84  (right arm) Cuff size:   regular  Vitals Entered By: Avon Gully CMA, Duncan Dull) (October 08, 2010 4:46 PM) CC: f/u BP   Primary Care Provider:  Linford Arnold, C  CC:  f/u BP.  History of Present Illness: Says he has felt more down and depressed this winter. Says he hopes it will be spring soon so his mood will be better.   Here to f/u BP  Says his COPD is stable. No recent flares.  He is exposed to wook burning stove.   Current Medications (verified): 1)  Albuterol Sulfate (2.5 Mg/84ml) 0.083%  Nebu (Albuterol Sulfate) .... Use in Nebulizer Machine Once A Day 2)  Lisinopril 20 Mg Tabs (Lisinopril) .... Take 1 Tablet By Mouth Two Times A Day 3)  Proair Hfa 108 (90 Base) Mcg/act  Aers (Albuterol Sulfate) .... 2 Puffs Inhaled Two Times A Day As Needed 4)  Advair Diskus 250-50 Mcg/dose Misc (Fluticasone-Salmeterol) .Marland Kitchen.. 1 Puff Inhaled Two Times A Day 5)  Spiriva Handihaler 18 Mcg  Caps (Tiotropium Bromide Monohydrate) .... Inhale One Puff Daily 6)  Metoprolol Tartrate 50 Mg Tabs (Metoprolol Tartrate) .... Take 1 Tablet By Mouth Two Times A Day 7)  Hydralazine Hcl 25 Mg Tabs (Hydralazine Hcl) .... Take 1 Tablet By Mouth Once A Day  Allergies (verified): No Known Drug Allergies  Comments:  Nurse/Medical Assistant: The patient's medications and allergies were reviewed with the patient and were updated in the Medication and Allergy Lists. Avon Gully CMA, Duncan Dull) (October 08, 2010 4:47 PM)  Past History:  Social History: Last updated: 10/20/2007 REtired.  Completed high school.  Uses a wood burning stove.  Current Smoker Alcohol use-no Drug use-no Regular exercise-no   Impression & Recommendations:  Problem # 1:  HYPERTENSION, BENIGN (ICD-401.1) Not quite at goal. Will try inc his  betablocker. Monitor for low HR an increased fatigued. F/u in one month for recheck.  His updated medication list for this problem includes:    Lisinopril 20 Mg Tabs (Lisinopril) .Marland Kitchen... Take 1 tablet by mouth two times a day    Metoprolol Tartrate 100 Mg Tabs (Metoprolol tartrate) .Marland Kitchen... Take 1 tablet by mouth two times a day    Hydralazine Hcl 25 Mg Tabs (Hydralazine hcl) .Marland Kitchen... Take 1 tablet by mouth once a day  Problem # 2:  DEPRESSION, SITUATIONAL, ACUTE (ICD-300.4) Pt prefers not to take medications or get counseling.  Likely has SADD. He wants to see how he feels over the next couple of month.   Complete Medication List: 1)  Albuterol Sulfate (2.5 Mg/58ml) 0.083% Nebu (Albuterol sulfate) .... Use in nebulizer machine once a day 2)  Lisinopril 20 Mg Tabs (Lisinopril) .... Take 1 tablet by mouth two times a day 3)  Proair Hfa 108 (90 Base) Mcg/act Aers (Albuterol sulfate) .... 2 puffs inhaled two times a day as needed 4)  Advair Diskus 250-50 Mcg/dose Misc (Fluticasone-salmeterol) .Marland Kitchen.. 1 puff inhaled two times a day 5)  Spiriva Handihaler 18 Mcg Caps (Tiotropium bromide monohydrate) .... Inhale one puff daily 6)  Metoprolol Tartrate 100 Mg Tabs (Metoprolol tartrate) .... Take 1 tablet by mouth two times a day 7)  Hydralazine Hcl 25 Mg Tabs (Hydralazine hcl) .... Take 1 tablet by  mouth once a day  Patient Instructions: 1)  Please schedule a follow-up appointment in 1 month to recheck your blood pressure.  Prescriptions: ALBUTEROL SULFATE (2.5 MG/3ML) 0.083%  NEBU (ALBUTEROL SULFATE) Use in nebulizer machine once a day  #100 pack x 11   Entered and Authorized by:   Nani Gasser MD   Signed by:   Nani Gasser MD on 10/08/2010   Method used:   Electronically to        [facility] # [phone]* (retail)       [address]       [place], [place]  [address]       Ph: [phone]       Fax: [phone]   RxID:   [id number] METOPROLOL TARTRATE 100 MG  TABS (METOPROLOL TARTRATE) Take 1 tablet by mouth two times a day  #60 x 2   Entered and Authorized by:   Nani Gasser MD   Signed by:   Nani Gasser MD on 10/08/2010   Method used:   Electronically to        [facility] # [phone]* (retail)       [address]       [place], [place]  [address]       Ph: [phone]       Fax: [phone]   RxID:   [id number]    Orders Added: 1)  Est. Patient Level III [30160]

## 2010-11-10 ENCOUNTER — Encounter: Payer: Self-pay | Admitting: Family Medicine

## 2010-11-10 ENCOUNTER — Ambulatory Visit (INDEPENDENT_AMBULATORY_CARE_PROVIDER_SITE_OTHER): Payer: Medicare Other | Admitting: Family Medicine

## 2010-11-10 DIAGNOSIS — I10 Essential (primary) hypertension: Secondary | ICD-10-CM

## 2010-11-17 NOTE — Assessment & Plan Note (Signed)
Summary: f/u HTN   Vital Signs:  Patient profile:   70 year old male Height:      71.5 inches Weight:      134 pounds Pulse rate:   58 / minute BP sitting:   136 / 84  (right arm) Cuff size:   regular  Vitals Entered By: Avon Gully CMA, Duncan Dull) (November 10, 2010 11:21 AM) CC: f/u BP   Primary Care Provider:  Linford Arnold, C  CC:  f/u BP.  History of Present Illness: COPD is stable. No recent exacerbations.  Just start the metoprolol 100mg  tab about 4 days ago.  No SE. tplerating well.   Current Medications (verified): 1)  Albuterol Sulfate (2.5 Mg/7ml) 0.083%  Nebu (Albuterol Sulfate) .... Use in Nebulizer Machine Once A Day 2)  Lisinopril 20 Mg Tabs (Lisinopril) .... Take 1 Tablet By Mouth Two Times A Day 3)  Proair Hfa 108 (90 Base) Mcg/act  Aers (Albuterol Sulfate) .... 2 Puffs Inhaled Two Times A Day As Needed 4)  Advair Diskus 250-50 Mcg/dose Misc (Fluticasone-Salmeterol) .Marland Kitchen.. 1 Puff Inhaled Two Times A Day 5)  Spiriva Handihaler 18 Mcg  Caps (Tiotropium Bromide Monohydrate) .... Inhale One Puff Daily 6)  Metoprolol Tartrate 50 Mg Tabs (Metoprolol Tartrate) .... Take One Tablet By Mouth Two Times A Day 7)  Hydralazine Hcl 25 Mg Tabs (Hydralazine Hcl) .... Take 1 Tablet By Mouth Once A Day  Allergies (verified): No Known Drug Allergies  Comments:  Nurse/Medical Assistant: The patient's medications and allergies were reviewed with the patient and were updated in the Medication and Allergy Lists. Avon Gully CMA, Duncan Dull) (November 10, 2010 11:22 AM)  Physical Exam  General:  Well-developed,well-nourished,in no acute distress; alert,appropriate and cooperative throughout examination Lungs:  Poor air movement. NO wheezing.  Heart:  Normal rate and regular rhythm. S1 and S2 normal without gallop, murmur, click, rub or other extra sounds.   Impression & Recommendations:  Problem # 1:  HYPERTENSION, BENIGN (ICD-401.1) Improved today. just started the  higher dose 4  days and tolerating well. His COPD is stable and this has not caused any exacerbations. If has problmes consider changing to a Calcium channel blockier.  His updated medication list for this problem includes:    Lisinopril 20 Mg Tabs (Lisinopril) .Marland Kitchen... Take 1 tablet by mouth two times a day    Metoprolol Tartrate 100 Mg Tabs (Metoprolol tartrate) .Marland Kitchen... Take 1 tablet by mouth two times a day    Hydralazine Hcl 25 Mg Tabs (Hydralazine hcl) .Marland Kitchen... Take 1 tablet by mouth once a day  Complete Medication List: 1)  Albuterol Sulfate (2.5 Mg/70ml) 0.083% Nebu (Albuterol sulfate) .... Use in nebulizer machine once a day 2)  Lisinopril 20 Mg Tabs (Lisinopril) .... Take 1 tablet by mouth two times a day 3)  Proair Hfa 108 (90 Base) Mcg/act Aers (Albuterol sulfate) .... 2 puffs inhaled two times a day as needed 4)  Advair Diskus 250-50 Mcg/dose Misc (Fluticasone-salmeterol) .Marland Kitchen.. 1 puff inhaled two times a day 5)  Spiriva Handihaler 18 Mcg Caps (Tiotropium bromide monohydrate) .... Inhale one puff daily 6)  Metoprolol Tartrate 100 Mg Tabs (Metoprolol tartrate) .... Take 1 tablet by mouth two times a day 7)  Hydralazine Hcl 25 Mg Tabs (Hydralazine hcl) .... Take 1 tablet by mouth once a day  Patient Instructions: 1)  REstart your allergy medication.   2)  Blood puressure looks great.  3)  Follow up in 3 months.    Orders Added: 1)  Est.  Patient Level II [78469]

## 2010-11-29 ENCOUNTER — Other Ambulatory Visit: Payer: Self-pay | Admitting: Family Medicine

## 2011-01-28 ENCOUNTER — Other Ambulatory Visit: Payer: Self-pay | Admitting: Family Medicine

## 2011-02-26 ENCOUNTER — Other Ambulatory Visit: Payer: Self-pay | Admitting: Family Medicine

## 2011-03-31 ENCOUNTER — Other Ambulatory Visit: Payer: Self-pay | Admitting: Family Medicine

## 2011-04-21 ENCOUNTER — Encounter: Payer: Self-pay | Admitting: Family Medicine

## 2011-04-27 ENCOUNTER — Ambulatory Visit (INDEPENDENT_AMBULATORY_CARE_PROVIDER_SITE_OTHER): Payer: Medicare Other | Admitting: Family Medicine

## 2011-04-27 ENCOUNTER — Encounter: Payer: Self-pay | Admitting: Family Medicine

## 2011-04-27 DIAGNOSIS — J449 Chronic obstructive pulmonary disease, unspecified: Secondary | ICD-10-CM

## 2011-04-27 DIAGNOSIS — I10 Essential (primary) hypertension: Secondary | ICD-10-CM

## 2011-04-27 MED ORDER — ALBUTEROL SULFATE (2.5 MG/3ML) 0.083% IN NEBU: 2.5000 mg | INHALATION_SOLUTION | Freq: Four times a day (QID) | RESPIRATORY_TRACT | Status: DC | PRN

## 2011-04-27 MED ORDER — TIOTROPIUM BROMIDE MONOHYDRATE 18 MCG IN CAPS: 18.0000 ug | ORAL_CAPSULE | Freq: Every day | RESPIRATORY_TRACT | Status: DC

## 2011-04-27 MED ORDER — LISINOPRIL 20 MG PO TABS: 20.0000 mg | ORAL_TABLET | Freq: Every day | ORAL | Status: DC

## 2011-04-27 MED ORDER — HYDRALAZINE HCL 25 MG PO TABS: 25.0000 mg | ORAL_TABLET | Freq: Every day | ORAL | Status: DC

## 2011-04-27 MED ORDER — ALBUTEROL SULFATE HFA 108 (90 BASE) MCG/ACT IN AERS: 2.0000 | INHALATION_SPRAY | Freq: Four times a day (QID) | RESPIRATORY_TRACT | Status: DC | PRN

## 2011-04-27 MED ORDER — METOPROLOL TARTRATE 100 MG PO TABS: 100.0000 mg | ORAL_TABLET | Freq: Two times a day (BID) | ORAL | Status: DC

## 2011-04-27 MED ORDER — FLUTICASONE-SALMETEROL 250-50 MCG/DOSE IN AEPB: 1.0000 | INHALATION_SPRAY | Freq: Every day | RESPIRATORY_TRACT | Status: DC

## 2011-04-27 NOTE — Progress Notes (Signed)
  Subjective:    Patient ID: Jared Lee, male    DOB: 1941/04/14, 70 y.o.   MRN: [id number]  HPI Here for f/u for BP. Has a home cuff and has been checking it. Has been getting under 140 most days. Just a couple of high BP's thought. No SP or SOB.  He is compliant with his medications. He denies any side effects.  COPD - Stable. Struggling some with the hot days this summer. He wants to know if would qualifty for oxygen.  He says he takes his meds regularly. He was in the hospt in May for his COPD exacerbation at [facility].    Review of Systems     Objective:   Physical Exam  Constitutional: He is oriented to person, place, and time. He appears well-developed and well-nourished.  HENT:  Head: Normocephalic and atraumatic.  Cardiovascular: Normal rate, regular rhythm and normal heart sounds.        No carotid bruits.   Pulmonary/Chest: Effort normal.       Prolonged expiration.   Neurological: He is alert and oriented to person, place, and time.  Skin: Skin is warm and dry.  Psychiatric: He has a normal mood and affect. His behavior is normal.          Assessment & Plan:

## 2011-04-27 NOTE — Assessment & Plan Note (Signed)
Overall he's been doing well since May. Then on how well he does this when her we could consider adding Daliresp, but experienced may be a factor. I did refill his medications today. We did have him walk and he only dropped to 90% on room air while walking. This is not a candidate for oxygen therapy.

## 2011-04-27 NOTE — Assessment & Plan Note (Signed)
His blood pressure is elevated today but it seems like his blood pressures have been well-controlled at home. We will continue his current regimen. Followup in 6 months. Medication refills percent to his pharmacy. Note he did decline a flu shot today.

## 2011-09-21 DIAGNOSIS — R0602 Shortness of breath: Secondary | ICD-10-CM | POA: Diagnosis not present

## 2011-09-21 DIAGNOSIS — J449 Chronic obstructive pulmonary disease, unspecified: Secondary | ICD-10-CM | POA: Diagnosis not present

## 2011-09-21 DIAGNOSIS — Z79899 Other long term (current) drug therapy: Secondary | ICD-10-CM | POA: Diagnosis not present

## 2011-09-21 DIAGNOSIS — R0609 Other forms of dyspnea: Secondary | ICD-10-CM | POA: Diagnosis not present

## 2011-09-21 DIAGNOSIS — R05 Cough: Secondary | ICD-10-CM | POA: Diagnosis not present

## 2011-09-21 DIAGNOSIS — J209 Acute bronchitis, unspecified: Secondary | ICD-10-CM | POA: Diagnosis not present

## 2011-09-21 DIAGNOSIS — R0989 Other specified symptoms and signs involving the circulatory and respiratory systems: Secondary | ICD-10-CM | POA: Diagnosis not present

## 2011-09-21 DIAGNOSIS — D696 Thrombocytopenia, unspecified: Secondary | ICD-10-CM | POA: Diagnosis not present

## 2011-09-21 DIAGNOSIS — J40 Bronchitis, not specified as acute or chronic: Secondary | ICD-10-CM | POA: Diagnosis not present

## 2011-10-11 ENCOUNTER — Ambulatory Visit (INDEPENDENT_AMBULATORY_CARE_PROVIDER_SITE_OTHER): Payer: Medicare Other | Admitting: Family Medicine

## 2011-10-11 ENCOUNTER — Encounter: Payer: Self-pay | Admitting: Family Medicine

## 2011-10-11 VITALS — BP 140/79 | HR 64 | Temp 97.9°F | Wt 136.0 lb

## 2011-10-11 DIAGNOSIS — H903 Sensorineural hearing loss, bilateral: Secondary | ICD-10-CM | POA: Insufficient documentation

## 2011-10-11 DIAGNOSIS — R7309 Other abnormal glucose: Secondary | ICD-10-CM | POA: Diagnosis not present

## 2011-10-11 DIAGNOSIS — I10 Essential (primary) hypertension: Secondary | ICD-10-CM

## 2011-10-11 DIAGNOSIS — J449 Chronic obstructive pulmonary disease, unspecified: Secondary | ICD-10-CM

## 2011-10-11 DIAGNOSIS — Z125 Encounter for screening for malignant neoplasm of prostate: Secondary | ICD-10-CM | POA: Diagnosis not present

## 2011-10-11 DIAGNOSIS — R636 Underweight: Secondary | ICD-10-CM | POA: Insufficient documentation

## 2011-10-11 DIAGNOSIS — H9190 Unspecified hearing loss, unspecified ear: Secondary | ICD-10-CM | POA: Insufficient documentation

## 2011-10-11 MED ORDER — ALBUTEROL SULFATE (2.5 MG/3ML) 0.083% IN NEBU
2.5000 mg | INHALATION_SOLUTION | Freq: Four times a day (QID) | RESPIRATORY_TRACT | Status: DC | PRN
  Administered 2011-10-11: 2.5 mg via RESPIRATORY_TRACT

## 2011-10-11 NOTE — Patient Instructions (Signed)
Please drop off your stool cards once you get the samples  We will call you with your lab results. If you don't here from [place] in about a week then please give [place] a call at [phone].  Smoking Cessation, Tips for Success YOU CAN QUIT SMOKING If you are ready to quit smoking, congratulations! You have chosen to help yourself be healthier. Cigarettes bring nicotine, tar, carbon monoxide, and other irritants into your body. Your lungs, heart, and blood vessels will be able to work better without these poisons. There are many different ways to quit smoking. Nicotine gum, nicotine patches, a nicotine inhaler, or nicotine nasal spray can help with physical craving. Hypnosis, support groups, and medicines help break the habit of smoking. Here are some tips to help you quit for good.  Throw away all cigarettes.     Clean and remove all ashtrays from your home, work, and car.     On a card, write down your reasons for quitting. Carry the card with you and read it when you get the urge to smoke.     Cleanse your body of nicotine. Drink enough water and fluids to keep your urine clear or pale yellow. Do this after quitting to flush the nicotine from your body.     Learn to predict your moods. Do not let a bad situation be your excuse to have a cigarette. Some situations in your life might tempt you into wanting a cigarette.     Never have "just one" cigarette. It leads to wanting another and another. Remind yourself of your decision to quit.     Change habits associated with smoking. If you smoked while driving or when feeling stressed, try other activities to replace smoking. Stand up when drinking your coffee. Brush your teeth after eating. Sit in a different chair when you read the paper. Avoid alcohol while trying to quit, and try to drink fewer caffeinated beverages. Alcohol and caffeine may urge you to smoke.     Avoid foods and drinks that can trigger a desire to smoke, such as sugary or spicy foods  and alcohol.     Ask people who smoke not to smoke around you.     Have something planned to do right after eating or having a cup of coffee. Take a walk or exercise to perk you up. This will help to keep you from overeating.     Try a relaxation exercise to calm you down and decrease your stress. Remember, you may be tense and nervous for the first 2 weeks after you quit, but this will pass.     Find new activities to keep your hands busy. Play with a pen, coin, or rubber band. Doodle or draw things on paper.     Brush your teeth right after eating. This will help cut down on the craving for the taste of tobacco after meals. You can try mouthwash, too.     Use oral substitutes, such as lemon drops, carrots, a cinnamon stick, or chewing gum, in place of cigarettes. Keep them handy so they are available when you have the urge to smoke.     When you have the urge to smoke, try deep breathing.     Designate your home as a nonsmoking area.     If you are a heavy smoker, ask your caregiver about a prescription for nicotine chewing gum. It can ease your withdrawal from nicotine.     Reward yourself. Set aside the  cigarette money you save and buy yourself something nice.     Look for support from others. Join a support group or smoking cessation program. Ask someone at home or at work to help you with your plan to quit smoking.     Always ask yourself, "Do I need this cigarette or is this just a reflex?" Tell yourself, "Today, I choose not to smoke," or "I do not want to smoke." You are reminding yourself of your decision to quit, even if you do smoke a cigarette.  HOW WILL I FEEL WHEN I QUIT SMOKING?  The benefits of not smoking start within days of quitting.     You may have symptoms of withdrawal because your body is used to nicotine (the addictive substance in cigarettes). You may crave cigarettes, be irritable, feel very hungry, cough often, get headaches, or have difficulty concentrating.      The withdrawal symptoms are only temporary. They are strongest when you first quit but will go away within 10 to 14 days.     When withdrawal symptoms occur, stay in control. Think about your reasons for quitting. Remind yourself that these are signs that your body is healing and getting used to being without cigarettes.     Remember that withdrawal symptoms are easier to treat than the major diseases that smoking can cause.     Even after the withdrawal is over, expect periodic urges to smoke. However, these cravings are generally short-lived and will go away whether you smoke or not. Do not smoke!     If you relapse and smoke again, do not lose hope. Most smokers quit 3 times before they are successful.     If you relapse, do not give up! Plan ahead and think about what you will do the next time you get the urge to smoke.  LIFE AS A NONSMOKER: MAKE IT FOR A MONTH, MAKE IT FOR LIFE Day 1: Hang this page where you will see it every day. Day 2: Get rid of all ashtrays, matches, and lighters. Day 3: Drink water. Breathe deeply between sips. Day 4: Avoid places with smoke-filled air, such as bars, clubs, or the smoking section of restaurants. Day 5: Keep track of how much money you save by not smoking. Day 6: Avoid boredom. Keep a good book with you or go to the movies. Day 7: Reward yourself! One week without smoking! Day 8: Make a dental appointment to get your teeth cleaned. Day 9: Decide how you will turn down a cigarette before it is offered to you. Day 10: Review your reasons for quitting. Day 11: Distract yourself. Stay active to keep your mind off smoking and to relieve tension. Take a walk, exercise, read a book, do a crossword puzzle, or try a new hobby. Day 12: Exercise. Get off the bus before your stop or use stairs instead of escalators. Day 13: Call on friends for support and encouragement. Day 14: Reward yourself! Two weeks without smoking! Day 15: Practice deep breathing  exercises. Day 16: Bet a friend that you can stay a nonsmoker. Day 17: Ask to sit in nonsmoking sections of restaurants. Day 18: Hang up "No Smoking" signs. Day 19: Think of yourself as a nonsmoker. Day 20: Each morning, tell yourself you will not smoke. Day 21: Reward yourself! Three weeks without smoking! Day 22: Think of smoking in negative ways. Remember how it stains your teeth, gives you bad breath, and leaves you short of breath. Day 23:  Eat a nutritious breakfast. Day 24:Do not relive your days as a smoker. Day 25: Hold a pencil in your hand when talking on the telephone. Day 26: Tell all your friends you do not smoke. Day 27: Think about how much better food tastes. Day 28: Remember, one cigarette is one too many. Day 29: Take up a hobby that will keep your hands busy. Day 30: Congratulations! One month without smoking! Give yourself a big reward. Your caregiver can direct you to community resources or hospitals for support, which may include:  Group support.     Education.    Hypnosis.    Subliminal therapy.  Document Released: 05/14/2004 Document Revised: 04/28/2011 Document Reviewed: 06/02/2009 [facility] Patient Information 2012 [place], [place].

## 2011-10-11 NOTE — Progress Notes (Signed)
  Subjective:    Patient ID: Jared Lee, male    DOB: 07/15/1941, 71 y.o.   MRN: [id number]  Hypertension This is a chronic problem. The current episode started more than 1 year ago. The problem is unchanged. The problem is controlled. Associated symptoms include shortness of breath. Pertinent negatives include no chest pain. There are no associated agents to hypertension. Risk factors for coronary artery disease include no known risk factors. Past treatments include ACE inhibitors. There are no compliance problems.     COPD - Went to ED 1/22 for COPD exacerbation and bronchitis. Had URI sxs and a fever.  Says having hard time breathing today. Was treated with zpack and prednisone. URI sxs and fever have resolved.  Says this type of weather tends to aggrevated his sxs.    Review of Systems  Respiratory: Positive for shortness of breath.   Cardiovascular: Negative for chest pain.       Objective:   Physical Exam  Constitutional: He is oriented to person, place, and time. He appears well-developed and well-nourished.  HENT:  Head: Normocephalic and atraumatic.  Cardiovascular: Normal rate, regular rhythm and normal heart sounds.   Pulmonary/Chest: Effort normal.       Dec BS bilaterally. No wheezing.   Neurological: He is alert and oriented to person, place, and time.  Skin: Skin is warm and dry.  Psychiatric: He has a normal mood and affect. His behavior is normal.          Assessment & Plan:  HTN - Borderline. I would like to increase his ACE but I really don't have an UTD renal function on him. Will get labs and have him f/u in 1 mo. Labs lip given for CMP, lipids.    COPD - Recent exacerbation. Didn't require an O/N stay. NEB given here today.  Encouraged smoking cessation. WAlk test today only dropped to 90%.  Call if getting more SOB and consider repeat course of steorids.   Colon Ca screening. Given stool cards today.

## 2011-10-12 LAB — COMPLETE METABOLIC PANEL WITH GFR
Albumin: 4.3 g/dL (ref 3.5–5.2)
Alkaline Phosphatase: 73 U/L (ref 39–117)
BUN: 11 mg/dL (ref 6–23)
CO2: 29 mEq/L (ref 19–32)
GFR, Est African American: >89 mL/min
GFR, Est Non African American: >89 mL/min
Glucose, Bld: 101 mg/dL — ABNORMAL HIGH (ref 70–99)
Potassium: 4.8 mEq/L (ref 3.5–5.3)
Total Bilirubin: 0.5 mg/dL (ref 0.3–1.2)

## 2011-10-12 LAB — LIPID PANEL
Cholesterol: 215 mg/dL — ABNORMAL HIGH (ref 0–200)
Total CHOL/HDL Ratio: 3.7 Ratio

## 2011-10-12 LAB — HEMOGLOBIN A1C: Hgb A1c MFr Bld: 6.2 % — ABNORMAL HIGH (ref <5.7)

## 2011-10-12 LAB — PSA: PSA: 1 ng/mL (ref <=4.00)

## 2011-10-15 DIAGNOSIS — J438 Other emphysema: Secondary | ICD-10-CM | POA: Diagnosis not present

## 2011-10-15 DIAGNOSIS — I10 Essential (primary) hypertension: Secondary | ICD-10-CM | POA: Diagnosis not present

## 2011-10-15 DIAGNOSIS — E785 Hyperlipidemia, unspecified: Secondary | ICD-10-CM | POA: Diagnosis not present

## 2011-10-15 DIAGNOSIS — J449 Chronic obstructive pulmonary disease, unspecified: Secondary | ICD-10-CM | POA: Diagnosis not present

## 2011-10-15 DIAGNOSIS — J982 Interstitial emphysema: Secondary | ICD-10-CM | POA: Diagnosis not present

## 2011-10-15 DIAGNOSIS — R0602 Shortness of breath: Secondary | ICD-10-CM | POA: Diagnosis not present

## 2011-10-15 DIAGNOSIS — J441 Chronic obstructive pulmonary disease with (acute) exacerbation: Secondary | ICD-10-CM | POA: Diagnosis not present

## 2011-10-16 ENCOUNTER — Other Ambulatory Visit: Payer: Self-pay | Admitting: Family Medicine

## 2011-10-16 DIAGNOSIS — J441 Chronic obstructive pulmonary disease with (acute) exacerbation: Secondary | ICD-10-CM | POA: Diagnosis not present

## 2011-10-16 DIAGNOSIS — E785 Hyperlipidemia, unspecified: Secondary | ICD-10-CM | POA: Diagnosis not present

## 2011-10-16 DIAGNOSIS — I10 Essential (primary) hypertension: Secondary | ICD-10-CM | POA: Diagnosis not present

## 2011-10-16 MED ORDER — PRAVASTATIN SODIUM 20 MG PO TABS: 20.0000 mg | ORAL_TABLET | Freq: Every day | ORAL | Status: DC

## 2011-10-17 DIAGNOSIS — J4489 Other specified chronic obstructive pulmonary disease: Secondary | ICD-10-CM | POA: Diagnosis not present

## 2011-10-17 DIAGNOSIS — J449 Chronic obstructive pulmonary disease, unspecified: Secondary | ICD-10-CM | POA: Diagnosis not present

## 2011-10-17 DIAGNOSIS — J9620 Acute and chronic respiratory failure, unspecified whether with hypoxia or hypercapnia: Secondary | ICD-10-CM | POA: Diagnosis present

## 2011-10-17 DIAGNOSIS — J984 Other disorders of lung: Secondary | ICD-10-CM | POA: Diagnosis not present

## 2011-10-17 DIAGNOSIS — E871 Hypo-osmolality and hyponatremia: Secondary | ICD-10-CM | POA: Diagnosis present

## 2011-10-17 DIAGNOSIS — J9819 Other pulmonary collapse: Secondary | ICD-10-CM | POA: Diagnosis not present

## 2011-10-17 DIAGNOSIS — E785 Hyperlipidemia, unspecified: Secondary | ICD-10-CM | POA: Diagnosis not present

## 2011-10-17 DIAGNOSIS — R112 Nausea with vomiting, unspecified: Secondary | ICD-10-CM | POA: Diagnosis not present

## 2011-10-17 DIAGNOSIS — R918 Other nonspecific abnormal finding of lung field: Secondary | ICD-10-CM | POA: Diagnosis not present

## 2011-10-17 DIAGNOSIS — F17200 Nicotine dependence, unspecified, uncomplicated: Secondary | ICD-10-CM | POA: Diagnosis present

## 2011-10-17 DIAGNOSIS — I10 Essential (primary) hypertension: Secondary | ICD-10-CM | POA: Diagnosis not present

## 2011-10-17 DIAGNOSIS — R05 Cough: Secondary | ICD-10-CM | POA: Diagnosis not present

## 2011-10-17 DIAGNOSIS — R0602 Shortness of breath: Secondary | ICD-10-CM | POA: Diagnosis not present

## 2011-10-17 DIAGNOSIS — J440 Chronic obstructive pulmonary disease with acute lower respiratory infection: Secondary | ICD-10-CM | POA: Diagnosis not present

## 2011-10-17 DIAGNOSIS — J438 Other emphysema: Secondary | ICD-10-CM | POA: Diagnosis not present

## 2011-10-17 DIAGNOSIS — J441 Chronic obstructive pulmonary disease with (acute) exacerbation: Secondary | ICD-10-CM | POA: Diagnosis not present

## 2011-10-17 DIAGNOSIS — R059 Cough, unspecified: Secondary | ICD-10-CM | POA: Diagnosis not present

## 2011-10-17 DIAGNOSIS — Z79899 Other long term (current) drug therapy: Secondary | ICD-10-CM | POA: Diagnosis not present

## 2011-10-17 DIAGNOSIS — R197 Diarrhea, unspecified: Secondary | ICD-10-CM | POA: Diagnosis not present

## 2011-10-17 DIAGNOSIS — R0789 Other chest pain: Secondary | ICD-10-CM | POA: Diagnosis present

## 2011-10-20 ENCOUNTER — Other Ambulatory Visit: Payer: Self-pay | Admitting: *Deleted

## 2011-10-20 DIAGNOSIS — J441 Chronic obstructive pulmonary disease with (acute) exacerbation: Secondary | ICD-10-CM | POA: Diagnosis not present

## 2011-10-20 DIAGNOSIS — A499 Bacterial infection, unspecified: Secondary | ICD-10-CM | POA: Diagnosis not present

## 2011-10-20 DIAGNOSIS — Z9981 Dependence on supplemental oxygen: Secondary | ICD-10-CM | POA: Diagnosis not present

## 2011-10-20 DIAGNOSIS — N390 Urinary tract infection, site not specified: Secondary | ICD-10-CM | POA: Diagnosis not present

## 2011-10-20 DIAGNOSIS — I10 Essential (primary) hypertension: Secondary | ICD-10-CM | POA: Diagnosis not present

## 2011-10-20 MED ORDER — PRAVASTATIN SODIUM 20 MG PO TABS: 20.0000 mg | ORAL_TABLET | Freq: Every day | ORAL | Status: DC

## 2011-10-21 DIAGNOSIS — I10 Essential (primary) hypertension: Secondary | ICD-10-CM | POA: Diagnosis not present

## 2011-10-21 DIAGNOSIS — A499 Bacterial infection, unspecified: Secondary | ICD-10-CM | POA: Diagnosis not present

## 2011-10-21 DIAGNOSIS — Z9981 Dependence on supplemental oxygen: Secondary | ICD-10-CM | POA: Diagnosis not present

## 2011-10-21 DIAGNOSIS — J441 Chronic obstructive pulmonary disease with (acute) exacerbation: Secondary | ICD-10-CM | POA: Diagnosis not present

## 2011-10-21 DIAGNOSIS — N390 Urinary tract infection, site not specified: Secondary | ICD-10-CM | POA: Diagnosis not present

## 2011-10-22 DIAGNOSIS — Z9981 Dependence on supplemental oxygen: Secondary | ICD-10-CM | POA: Diagnosis not present

## 2011-10-22 DIAGNOSIS — J441 Chronic obstructive pulmonary disease with (acute) exacerbation: Secondary | ICD-10-CM | POA: Diagnosis not present

## 2011-10-22 DIAGNOSIS — A499 Bacterial infection, unspecified: Secondary | ICD-10-CM | POA: Diagnosis not present

## 2011-10-22 DIAGNOSIS — I10 Essential (primary) hypertension: Secondary | ICD-10-CM | POA: Diagnosis not present

## 2011-10-22 DIAGNOSIS — N390 Urinary tract infection, site not specified: Secondary | ICD-10-CM | POA: Diagnosis not present

## 2011-10-25 DIAGNOSIS — J441 Chronic obstructive pulmonary disease with (acute) exacerbation: Secondary | ICD-10-CM | POA: Diagnosis not present

## 2011-10-25 DIAGNOSIS — I10 Essential (primary) hypertension: Secondary | ICD-10-CM | POA: Diagnosis not present

## 2011-10-25 DIAGNOSIS — A499 Bacterial infection, unspecified: Secondary | ICD-10-CM | POA: Diagnosis not present

## 2011-10-25 DIAGNOSIS — N390 Urinary tract infection, site not specified: Secondary | ICD-10-CM | POA: Diagnosis not present

## 2011-10-25 DIAGNOSIS — Z9981 Dependence on supplemental oxygen: Secondary | ICD-10-CM | POA: Diagnosis not present

## 2011-10-26 DIAGNOSIS — Z9981 Dependence on supplemental oxygen: Secondary | ICD-10-CM | POA: Diagnosis not present

## 2011-10-26 DIAGNOSIS — J441 Chronic obstructive pulmonary disease with (acute) exacerbation: Secondary | ICD-10-CM | POA: Diagnosis not present

## 2011-10-26 DIAGNOSIS — I10 Essential (primary) hypertension: Secondary | ICD-10-CM | POA: Diagnosis not present

## 2011-10-26 DIAGNOSIS — A499 Bacterial infection, unspecified: Secondary | ICD-10-CM | POA: Diagnosis not present

## 2011-10-26 DIAGNOSIS — N390 Urinary tract infection, site not specified: Secondary | ICD-10-CM | POA: Diagnosis not present

## 2011-10-29 DIAGNOSIS — I10 Essential (primary) hypertension: Secondary | ICD-10-CM | POA: Diagnosis not present

## 2011-10-29 DIAGNOSIS — J441 Chronic obstructive pulmonary disease with (acute) exacerbation: Secondary | ICD-10-CM | POA: Diagnosis not present

## 2011-10-29 DIAGNOSIS — Z9981 Dependence on supplemental oxygen: Secondary | ICD-10-CM | POA: Diagnosis not present

## 2011-10-29 DIAGNOSIS — A499 Bacterial infection, unspecified: Secondary | ICD-10-CM | POA: Diagnosis not present

## 2011-10-29 DIAGNOSIS — N390 Urinary tract infection, site not specified: Secondary | ICD-10-CM | POA: Diagnosis not present

## 2011-11-01 ENCOUNTER — Encounter: Payer: Self-pay | Admitting: *Deleted

## 2011-11-01 ENCOUNTER — Ambulatory Visit (INDEPENDENT_AMBULATORY_CARE_PROVIDER_SITE_OTHER): Payer: Medicare Other | Admitting: Family Medicine

## 2011-11-01 ENCOUNTER — Encounter: Payer: Self-pay | Admitting: Family Medicine

## 2011-11-01 DIAGNOSIS — J441 Chronic obstructive pulmonary disease with (acute) exacerbation: Secondary | ICD-10-CM | POA: Diagnosis not present

## 2011-11-01 DIAGNOSIS — I10 Essential (primary) hypertension: Secondary | ICD-10-CM | POA: Diagnosis not present

## 2011-11-01 MED ORDER — LISINOPRIL 40 MG PO TABS: 40.0000 mg | ORAL_TABLET | Freq: Every day | ORAL | Status: DC

## 2011-11-01 NOTE — Progress Notes (Signed)
  Subjective:    Patient ID: Jared Lee, male    DOB: 1941/08/16, 71 y.o.   MRN: [id number]  HPI [facility]  For COPD exacerbation. Out for about 2 weeks.  He is now on 2 liters of oxygen.  Completed his antibiotics. Cough is much better.  No fever.  Still some SOB. Says he feels much better on oxygen therapy. He is currently getting his oxygen and supplies through [facility].  Will get records to review.   Review of Systems     Objective:   Physical Exam  Constitutional: He is oriented to person, place, and time. He appears well-developed and well-nourished.  HENT:  Head: Normocephalic and atraumatic.  Right Ear: External ear normal.  Left Ear: External ear normal.  Nose: Nose normal.  Mouth/Throat: Oropharynx is clear and moist.       TMs and canals are clear.   Eyes: Conjunctivae and EOM are normal. Pupils are equal, round, and reactive to light.  Neck: Neck supple. No thyromegaly present.  Cardiovascular: Normal rate and normal heart sounds.   Pulmonary/Chest: Effort normal and breath sounds normal.  Lymphadenopathy:    He has no cervical adenopathy.  Neurological: He is alert and oriented to person, place, and time.  Skin: Skin is warm and dry.  Psychiatric: He has a normal mood and affect.          Assessment & Plan:  COPD Exacerbation  - Back to baseline. He has completed his antibiotics and his steroids. He says he feels like he is doing much better on the oxygen therapy. We have tried doing some office in the past and he was just borderline. I'll see him back in 6 weeks to evaluate how he's doing.  Hypertension-his blood pressure is up a little today. We will increase his lisinopril to 40 mg. Followup in 6 weeks.

## 2011-11-02 ENCOUNTER — Ambulatory Visit (INDEPENDENT_AMBULATORY_CARE_PROVIDER_SITE_OTHER): Payer: Medicare Other | Admitting: Family Medicine

## 2011-11-02 ENCOUNTER — Encounter: Payer: Self-pay | Admitting: Family Medicine

## 2011-11-02 VITALS — BP 159/80 | HR 97 | Ht 71.5 in | Wt 141.0 lb

## 2011-11-02 DIAGNOSIS — Z7409 Other reduced mobility: Secondary | ICD-10-CM

## 2011-11-02 DIAGNOSIS — Z1331 Encounter for screening for depression: Secondary | ICD-10-CM

## 2011-11-02 DIAGNOSIS — R29898 Other symptoms and signs involving the musculoskeletal system: Secondary | ICD-10-CM

## 2011-11-02 DIAGNOSIS — Z9181 History of falling: Secondary | ICD-10-CM | POA: Diagnosis not present

## 2011-11-02 DIAGNOSIS — J449 Chronic obstructive pulmonary disease, unspecified: Secondary | ICD-10-CM

## 2011-11-02 NOTE — Progress Notes (Signed)
Subjective:    Patient ID: Jared Lee, male    DOB: [birth year], 71 y.o.   MRN: [id number]  HPI Evaluation for Power WheelChair.  He has hx of Tob abuse, COPD, underweight.  He describes feeling SOB and fatigued after about 50-60 feet.  Doesn't fix  His own meals. Feeds himself OK. No problems with showers or bathroom.  Grooming is OK. He does get short of breath sometimes walking from room to room.   He uses a can at home, straight cane. Drives sometimes.   89% on room air.   He is recently on oxygen after having a recent COPD exacerbation, resulting in hospitalization.  Here today we did a walk test and he Desat to 88% with walking here in the office today.  Would have difficulty with manual wheelchiar for movement of the handle bar and better ofr home.He denies any extremity weakness       Review of Systems  BP 159/80  Pulse 97  Ht 5' 11.5" (1.816 m)  Wt 141 lb (63.957 kg)  BMI 19.39 kg/m2  SpO2 92%    No Known Allergies  No past medical history on file.  No past surgical history on file.  History   Social History  . Marital Status: Single    Spouse Name: N/A    Number of Children: N/A  . Years of Education: N/A   Occupational History  . Not on file.   Social History Main Topics  . Smoking status: Current Everyday Smoker -- 0.3 packs/day    Types: Cigarettes  . Smokeless tobacco: Not on file  . Alcohol Use: No  . Drug Use: No  . Sexually Active: Not on file   Other Topics Concern  . Not on file   Social History Narrative  . No narrative on file    Family History  Problem Relation Age of Onset  . Hypertension Mother   . Heart disease Father   . Hypertension Father     Current outpatient prescriptions:albuterol (PROAIR HFA) 108 (90 BASE) MCG/ACT inhaler, Inhale 2 puffs into the lungs every 6 (six) hours as needed for wheezing., Disp: 18 g, Rfl: 5;  albuterol (PROVENTIL) (2.5 MG/3ML) 0.083% nebulizer solution, Take 2.5 mg by nebulization every 6 (six) hours as  needed., Disp: 75 mL, Rfl: 5;  Fluticasone-Salmeterol (ADVAIR DISKUS) 250-50 MCG/DOSE AEPB, Inhale 1 puff into the lungs daily., Disp: 60 each, Rfl: 11 hydrALAZINE (APRESOLINE) 25 MG tablet, Take 1 tablet (25 mg total) by mouth daily., Disp: 30 tablet, Rfl: 11;  lisinopril (PRINIVIL,ZESTRIL) 40 MG tablet, Take 1 tablet (40 mg total) by mouth daily., Disp: 30 tablet, Rfl: 11;  metoprolol (LOPRESSOR) 100 MG tablet, Take 1 tablet (100 mg total) by mouth 2 (two) times daily., Disp: 60 tablet, Rfl: 11 pravastatin (PRAVACHOL) 20 MG tablet, Take 1 tablet (20 mg total) by mouth daily., Disp: 30 tablet, Rfl: 11;  tiotropium (SPIRIVA HANDIHALER) 18 MCG inhalation capsule, Place 1 capsule (18 mcg total) into inhaler and inhale daily., Disp: 30 capsule, Rfl: 11 Current facility-administered medications:albuterol (PROVENTIL) (2.5 MG/3ML) 0.083% nebulizer solution 2.5 mg, 2.5 mg, Nebulization, Q6H PRN, Nani Gasser, MD, 2.5 mg at 10/11/11 1324      Objective:   Physical Exam  Constitutional: He is oriented to person, place, and time. He appears well-developed and well-nourished.       Thin appearing male who is underweight.  HENT:  Head: Normocephalic and atraumatic.  Cardiovascular: Normal rate, regular rhythm and normal heart sounds.  Pulmonary/Chest: Effort normal.       Coarse BS bilaterally   Musculoskeletal: He exhibits no edema.       Neck with NROM, Shoulder, hips, and knees andn ankles with NROM. Strength in is enlarged in T. his is symmetric and 5 over 5 bilaterally. Hand grip is normal and symmetric. He does get more short of breath with trying to do the resistance tests.  Neurological: He is alert and oriented to person, place, and time. He has normal reflexes. He displays normal reflexes. Coordination normal.       Normal gait. He is able to stand on his tiptoes.  Skin: Skin is warm and dry.  Psychiatric: He has a normal mood and affect. His behavior is normal.          Assessment &  Plan:  Power Wheelchair Assessment:  Patient does have a diagnosis of severe COPD requiring oxygen. He does get extremely short of breath with activities. He actually got short of breath during the resistance tests on his exam today. He also desaturates to 88% while walking. He is currently on 2 L of oxygen daily. On exam he does have normal strength his upper and lower extremities but fatigues easily. I do think he would be a good candidate for a power wheelchair. With his shortness of breath and easy fatigability he would have difficulty propelling a manual wheelchair in addition to using the T-bar to control a scooter. Also because of accessibility in his home he would be better suited to have a power wheelchair which has better manuverability. Patient is mentally sound and safe to drive a power wheelchair. 30 min spent face to face for mobility evaluation. Form completed and returned to [facility].   Fall assessment score of 2 based on age.    Depression Screen - PHQ- 9 score 1. Neg for depression

## 2011-11-08 ENCOUNTER — Ambulatory Visit: Payer: Medicare Other | Admitting: Family Medicine

## 2011-11-27 ENCOUNTER — Other Ambulatory Visit: Payer: Self-pay | Admitting: Family Medicine

## 2011-11-30 ENCOUNTER — Other Ambulatory Visit: Payer: Self-pay | Admitting: *Deleted

## 2011-11-30 MED ORDER — TIOTROPIUM BROMIDE MONOHYDRATE 18 MCG IN CAPS: 18.0000 ug | ORAL_CAPSULE | Freq: Every day | RESPIRATORY_TRACT | Status: DC

## 2011-12-09 DIAGNOSIS — N390 Urinary tract infection, site not specified: Secondary | ICD-10-CM | POA: Diagnosis not present

## 2011-12-09 DIAGNOSIS — J441 Chronic obstructive pulmonary disease with (acute) exacerbation: Secondary | ICD-10-CM

## 2011-12-09 DIAGNOSIS — I10 Essential (primary) hypertension: Secondary | ICD-10-CM | POA: Diagnosis not present

## 2011-12-09 DIAGNOSIS — A499 Bacterial infection, unspecified: Secondary | ICD-10-CM | POA: Diagnosis not present

## 2011-12-09 DIAGNOSIS — Z9981 Dependence on supplemental oxygen: Secondary | ICD-10-CM

## 2011-12-10 ENCOUNTER — Encounter: Payer: Self-pay | Admitting: *Deleted

## 2011-12-13 ENCOUNTER — Ambulatory Visit (INDEPENDENT_AMBULATORY_CARE_PROVIDER_SITE_OTHER): Payer: Medicare Other | Admitting: Family Medicine

## 2011-12-13 ENCOUNTER — Encounter: Payer: Self-pay | Admitting: Family Medicine

## 2011-12-13 VITALS — BP 133/75 | HR 68 | Wt 134.0 lb

## 2011-12-13 DIAGNOSIS — I10 Essential (primary) hypertension: Secondary | ICD-10-CM | POA: Diagnosis not present

## 2011-12-13 DIAGNOSIS — J449 Chronic obstructive pulmonary disease, unspecified: Secondary | ICD-10-CM | POA: Diagnosis not present

## 2011-12-13 DIAGNOSIS — Z9181 History of falling: Secondary | ICD-10-CM

## 2011-12-13 MED ORDER — ROFLUMILAST 500 MCG PO TABS: 500.0000 ug | ORAL_TABLET | Freq: Every day | ORAL | Status: DC

## 2011-12-13 NOTE — Progress Notes (Signed)
  Subjective:    Patient ID: Jared Lee, male    DOB: November 30, 1940, 71 y.o.   MRN: [id number]  HPI COPD followup-overall he is doing well. He has not been hospitalized recently. He quit smoking 60 days ago. He has been using his inhalers consistently. He said he does not need any refills right now.  He is not wearing his oxygen today. He says he left the home.  Hypertension-no chest pain or short of breath. He is taking his medications regularly.   Review of Systems     Objective:   Physical Exam  Constitutional: He is oriented to person, place, and time. He appears well-developed and well-nourished.  HENT:  Head: Normocephalic and atraumatic.  Cardiovascular: Normal rate, regular rhythm and normal heart sounds.   Pulmonary/Chest: Effort normal and breath sounds normal.       Coarse BS bilaterally  Neurological: He is alert and oriented to person, place, and time.  Skin: Skin is warm and dry.  Psychiatric: He has a normal mood and affect. His behavior is normal.          Assessment & Plan:  COPD-he has had multiple exacerbations in the last year. I would like for him to try Daliresp. I gave him one month's worth of samples and gave him a coupon part for 1 three-month period that we can try for at least 2 months. The entire process appears insurance to see if it covered or not. Therefore this would keep him out of the hospital. F/U in 2 monts. Also congratulated him on smoking cessation.  Hypertension-well controlled today. At goal.  Colon cancer screening-unfortunately he did drop off some stool cards previously that they were done incorrectly. We gave him a new set of cards and explained to him today how to use them.  Fall assessment-score of 2. Low risk for falls.

## 2011-12-28 ENCOUNTER — Telehealth: Payer: Self-pay | Admitting: *Deleted

## 2011-12-28 NOTE — Telephone Encounter (Signed)
Pt dropped off stool cards however they still were not done right; pt also left a note stating daliresp didn't work for him. He felt sick on the medication and had diarrhea and a HA

## 2011-12-28 NOTE — Telephone Encounter (Signed)
OK. Will add to intolerance list.

## 2011-12-30 ENCOUNTER — Other Ambulatory Visit: Payer: Self-pay | Admitting: Family Medicine

## 2012-01-26 ENCOUNTER — Other Ambulatory Visit: Payer: Self-pay | Admitting: Family Medicine

## 2012-01-31 DIAGNOSIS — R197 Diarrhea, unspecified: Secondary | ICD-10-CM | POA: Diagnosis not present

## 2012-01-31 DIAGNOSIS — J441 Chronic obstructive pulmonary disease with (acute) exacerbation: Secondary | ICD-10-CM | POA: Diagnosis not present

## 2012-01-31 DIAGNOSIS — E785 Hyperlipidemia, unspecified: Secondary | ICD-10-CM | POA: Diagnosis not present

## 2012-01-31 DIAGNOSIS — K5730 Diverticulosis of large intestine without perforation or abscess without bleeding: Secondary | ICD-10-CM | POA: Diagnosis present

## 2012-01-31 DIAGNOSIS — F17200 Nicotine dependence, unspecified, uncomplicated: Secondary | ICD-10-CM | POA: Diagnosis present

## 2012-01-31 DIAGNOSIS — K2990 Gastroduodenitis, unspecified, without bleeding: Secondary | ICD-10-CM | POA: Diagnosis present

## 2012-01-31 DIAGNOSIS — J4489 Other specified chronic obstructive pulmonary disease: Secondary | ICD-10-CM | POA: Diagnosis not present

## 2012-01-31 DIAGNOSIS — R933 Abnormal findings on diagnostic imaging of other parts of digestive tract: Secondary | ICD-10-CM | POA: Diagnosis not present

## 2012-01-31 DIAGNOSIS — D371 Neoplasm of uncertain behavior of stomach: Secondary | ICD-10-CM | POA: Diagnosis not present

## 2012-01-31 DIAGNOSIS — D378 Neoplasm of uncertain behavior of other specified digestive organs: Secondary | ICD-10-CM | POA: Diagnosis not present

## 2012-01-31 DIAGNOSIS — K921 Melena: Secondary | ICD-10-CM | POA: Diagnosis not present

## 2012-01-31 DIAGNOSIS — K5010 Crohn's disease of large intestine without complications: Secondary | ICD-10-CM | POA: Diagnosis not present

## 2012-01-31 DIAGNOSIS — Z9981 Dependence on supplemental oxygen: Secondary | ICD-10-CM | POA: Diagnosis not present

## 2012-01-31 DIAGNOSIS — J449 Chronic obstructive pulmonary disease, unspecified: Secondary | ICD-10-CM | POA: Diagnosis not present

## 2012-01-31 DIAGNOSIS — R109 Unspecified abdominal pain: Secondary | ICD-10-CM | POA: Diagnosis not present

## 2012-01-31 DIAGNOSIS — D126 Benign neoplasm of colon, unspecified: Secondary | ICD-10-CM | POA: Diagnosis not present

## 2012-01-31 DIAGNOSIS — D509 Iron deficiency anemia, unspecified: Secondary | ICD-10-CM | POA: Diagnosis present

## 2012-01-31 DIAGNOSIS — R1033 Periumbilical pain: Secondary | ICD-10-CM | POA: Diagnosis not present

## 2012-01-31 DIAGNOSIS — Z79899 Other long term (current) drug therapy: Secondary | ICD-10-CM | POA: Diagnosis not present

## 2012-01-31 DIAGNOSIS — K920 Hematemesis: Secondary | ICD-10-CM | POA: Diagnosis not present

## 2012-01-31 DIAGNOSIS — K922 Gastrointestinal hemorrhage, unspecified: Secondary | ICD-10-CM | POA: Diagnosis not present

## 2012-01-31 DIAGNOSIS — J9610 Chronic respiratory failure, unspecified whether with hypoxia or hypercapnia: Secondary | ICD-10-CM | POA: Diagnosis not present

## 2012-01-31 DIAGNOSIS — I10 Essential (primary) hypertension: Secondary | ICD-10-CM | POA: Diagnosis not present

## 2012-01-31 DIAGNOSIS — D500 Iron deficiency anemia secondary to blood loss (chronic): Secondary | ICD-10-CM | POA: Diagnosis not present

## 2012-01-31 DIAGNOSIS — K319 Disease of stomach and duodenum, unspecified: Secondary | ICD-10-CM | POA: Diagnosis not present

## 2012-02-02 LAB — LIPID PANEL
Cholesterol: 152 mg/dL (ref 0–200)
HDL: 72 mg/dL — AB (ref 35–70)
Triglycerides: 48 mg/dL (ref 40–160)

## 2012-02-03 DIAGNOSIS — Z9889 Other specified postprocedural states: Secondary | ICD-10-CM

## 2012-02-03 HISTORY — DX: Other specified postprocedural states: Z98.890

## 2012-02-14 ENCOUNTER — Encounter: Payer: Self-pay | Admitting: Family Medicine

## 2012-02-14 ENCOUNTER — Ambulatory Visit (INDEPENDENT_AMBULATORY_CARE_PROVIDER_SITE_OTHER): Payer: Medicare Other | Admitting: Family Medicine

## 2012-02-14 VITALS — BP 128/78 | HR 61 | Ht 71.5 in | Wt 134.0 lb

## 2012-02-14 DIAGNOSIS — K649 Unspecified hemorrhoids: Secondary | ICD-10-CM | POA: Diagnosis not present

## 2012-02-14 DIAGNOSIS — J449 Chronic obstructive pulmonary disease, unspecified: Secondary | ICD-10-CM | POA: Diagnosis not present

## 2012-02-14 MED ORDER — AMBULATORY NON FORMULARY MEDICATION: Status: DC

## 2012-02-14 NOTE — Progress Notes (Signed)
  Subjective:    Patient ID: Jared Lee, male    DOB: [birth year], 71 y.o.   MRN: [id number]  HPI Was having blood in his stool.  Went to ED told had hemorrhoids. He was admitted overnight to [facility]. He has not had any more blood in the stool.  Stopped the dalirespt bc of HA, diarrhea ansd stomach cramps.    COPD - Needs a new nebulizer. One has if 4 years od and not working well. He is wearing his protable oxygen today.  He hasn't had any recent flares.  He says he plans on going to [facility] or due to consider getting a possible lung transplant. He is no longer smoking.   Review of Systems     Objective:   Physical Exam  Constitutional: He is oriented to person, place, and time. He appears well-developed and well-nourished.  HENT:  Head: Normocephalic and atraumatic.  Cardiovascular: Normal rate, regular rhythm and normal heart sounds.   Pulmonary/Chest: Effort normal and breath sounds normal.  Neurological: He is alert and oriented to person, place, and time.  Skin: Skin is warm and dry.  Psychiatric: He has a normal mood and affect. His behavior is normal.          Assessment & Plan:  COPD- Added daliresp to intolerance list.  Says needs a new rx for nebulizer sent to [facility]. No longer smoking.  He is interested in a lung transplant.  I asked him if he needs referral but he said he would try to arrange this himself. I did give him some samples of Spiriva today.  Hemorrhoids-evidently the blood in the stools resolved. I would like to call at regional get a copy of his admission for further review.  I did receive his notes from [facility]. He was discharged home on Advair Diskus, Levaquin and prednisone. This was in addition to his Spiriva. Thus he was treated for COPD exacerbation in addition to hematochezia they did perform an EGD. The EGD was benign. They did do some biopsies. They also performed a colonoscopy. Some polyps were removed and  sent for pathology. His final report concluded mildly nodular gastritis with colonic diverticulitis. Internal hemorrhoids. He was started on protonic 40 mg.

## 2012-02-16 ENCOUNTER — Encounter: Payer: Self-pay | Admitting: *Deleted

## 2012-02-18 ENCOUNTER — Emergency Department (INDEPENDENT_AMBULATORY_CARE_PROVIDER_SITE_OTHER)
Admission: EM | Admit: 2012-02-18 | Discharge: 2012-02-18 | Disposition: A | Discharge status: Home / Self Care | Payer: Medicare Other | Source: Home / Self Care | Attending: Family Medicine | Admitting: Family Medicine

## 2012-02-18 ENCOUNTER — Encounter: Payer: Self-pay | Admitting: *Deleted

## 2012-02-18 DIAGNOSIS — S30860A Insect bite (nonvenomous) of lower back and pelvis, initial encounter: Secondary | ICD-10-CM | POA: Diagnosis not present

## 2012-02-18 DIAGNOSIS — S30861A Insect bite (nonvenomous) of abdominal wall, initial encounter: Secondary | ICD-10-CM

## 2012-02-18 DIAGNOSIS — W57XXXA Bitten or stung by nonvenomous insect and other nonvenomous arthropods, initial encounter: Secondary | ICD-10-CM | POA: Diagnosis not present

## 2012-02-18 HISTORY — DX: Chronic obstructive pulmonary disease, unspecified: J44.9

## 2012-02-18 MED ORDER — DOXYCYCLINE HYCLATE 100 MG PO TABS: 100.0000 mg | ORAL_TABLET | Freq: Two times a day (BID) | ORAL | Status: AC

## 2012-02-18 NOTE — Discharge Instructions (Signed)
Wood Tick Bite Ticks are insects that attach themselves to the skin. Most tick bites are harmless, but sometimes ticks carry diseases that can make a person quite ill. The chance of getting ill depends on:  The kind of tick that bites you.   Time of year.   How long the tick is attached.   Geographic location.  Wood ticks are also called dog ticks. They are generally black. They can have white markings. They live in shrubs and grassy areas. They are larger than deer ticks. Wood ticks are about the size of a watermelon seed. They have a hard body. The most common places for ticks to attach themselves are the scalp, neck, armpits, waist, and groin. Wood ticks may stay attached for up to 2 weeks. TICKS MUST BE REMOVED AS SOON AS POSSIBLE TO HELP PREVENT DISEASES CAUSED BY TICK BITES.  TO REMOVE A TICK: 1. If available, put on latex gloves before trying to remove a tick.  2. Grasp the tick as close to the skin as possible, with curved forceps, fine tweezers or a special tick removal tool.  3. Pull gently with steady pressure until the tick lets go. Do not twist the tick or jerk it suddenly. This may break off the tick's head or mouth parts.  4. Do not crush the tick's body. This could force disease-carrying fluids from the tick into your body.  5. After the tick is removed, wash the bite area and your hands with soap and water or other disinfectant.  6. Apply a small amount of antiseptic cream or ointment to the bite site.  7. Wash and disinfect any instruments that were used.  8. Save the tick in a jar or plastic bag for later identification. Preserve the tick with a bit of alcohol or put it in the freezer.  9. Do not apply a hot match, petroleum jelly, or fingernail polish to the tick. This does not work and may increase the chances of disease from the tick bite.  YOU MAY NEED TO SEE YOUR CAREGIVER FOR A TETANUS SHOT NOW IF:  You have no idea when you had the last one.   You have never had a  tetanus shot before.  If you need a tetanus shot, and you decide not to get one, there is a rare chance of getting tetanus. Sickness from tetanus can be serious. If you get a tetanus shot, your arm may swell, get red and warm to the touch at the shot site. This is common and not a problem. PREVENTION  Wear protective clothing. Long sleeves and pants are best.   Wear white clothes to see ticks more easily   Tuck your pant legs into your socks.   If walking on trail, stay in the middle of the trail to avoid brushing against bushes.   Put insect repellent on all exposed skin and along boot tops, pant legs and sleeve cuffs   Check clothing, hair and skin repeatedly and before coming inside.   Brush off any ticks that are not attached.  SEEK MEDICAL CARE IF:   You cannot remove a tick or part of the tick that is left in the skin.   Unexplained fever.   Redness and swelling in the area of the tick bite.   Tender, swollen lymph glands.   Diarrhea.   Weight loss.   Cough.   Fatigue.   Muscle, joint or bone pain.   Belly pain.   Headache.   Rash.    SEEK IMMEDIATE MEDICAL CARE IF:   You develop an oral temperature above 102 F (38.9 C).   You are having trouble walking or moving your legs.   Numbness in the legs.   Shortness of breath.   Confusion.   Repeated vomiting.  Document Released: 08/13/2000 Document Revised: 08/05/2011 Document Reviewed: 07/22/2008 [facility] Patient Information 2012 [place], [place].  Lyme Disease You may have been bitten by a tick and are to watch for the development of Lyme Disease. Lyme Disease is an infection that is caused by a bacteria The bacteria causing this disease is named Borreilia burgdorferi. If a tick is infected with this bacteria and then bites you, then Lyme Disease may occur. These ticks are carried by deer and rodents such as rabbits and mice and infest grassy as well as forested areas. Fortunately most tick bites do not  cause Lyme Disease.  Lyme Disease is easier to prevent than to treat. First, covering your legs with clothing when walking in areas where ticks are possibly abundant will prevent their attachment because ticks tend to stay within inches of the ground. Second, using insecticides containing DEET can be applied on skin or clothing. Last, because it takes about 12 to 24 hours for the tick to transmit the disease after attachment to the human host, you should inspect your body for ticks twice a day when you are in areas where Lyme Disease is common. You must look thoroughly when searching for ticks. The Ixodes tick that carries Lyme Disease is very small. It is around the size of a sesame seed (picture of tick is not actual size). Removal is best done by grasping the tick by the head and pulling it out. Do not to squeeze the body of the tick. This could inject the infecting bacteria into the bite site. Wash the area of the bite with an antiseptic solution after removal.  Lyme Disease is a disease that may affect many body systems. Because of the small size of the biting tick, most people do not notice being bitten. The first sign of an infection is usually a round red rash that extends out from the center of the tick bite. The center of the lesion may be blood colored (hemorrhagic) or have tiny blisters (vesicular). Most lesions have bright red outer borders and partial central clearing. This rash may extend out many inches in diameter, and multiple lesions may be present. Other symptoms such as fatigue, headaches, chills and fever, general achiness and swelling of lymph glands may also occur. If this first stage of the disease is left untreated, these symptoms may gradually resolve by themselves, or progressive symptoms may occur because of spread of infection to other areas of the body.  Follow up with your caregiver to have testing and treatment if you have a tick bite and you develop any of the above complaints.  Your caregiver may recommend preventative (prophylactic) medications which kill bacteria (antibiotics). Once a diagnosis of Lyme Disease is made, antibiotic treatment is highly likely to cure the disease. Effective treatment of late stage Lyme Disease may require longer courses of antibiotic therapy.  MAKE SURE YOU:   Understand these instructions.   Will watch your condition.   Will get help right away if you are not doing well or get worse.  Document Released: 11/22/2000 Document Revised: 08/05/2011 Document Reviewed: 01/24/2009 [facility] Patient Information 2012 [place], [place].Lyme Disease You may have been bitten by a tick and are to watch for the development of Lyme  Disease. Lyme Disease is an infection that is caused by a bacteria The bacteria causing this disease is named Borreilia burgdorferi. If a tick is infected with this bacteria and then bites you, then Lyme Disease may occur. These ticks are carried by deer and rodents such as rabbits and mice and infest grassy as well as forested areas. Fortunately most tick bites do not cause Lyme Disease.  Lyme Disease is easier to prevent than to treat. First, covering your legs with clothing when walking in areas where ticks are possibly abundant will prevent their attachment because ticks tend to stay within inches of the ground. Second, using insecticides containing DEET can be applied on skin or clothing. Last, because it takes about 12 to 24 hours for the tick to transmit the disease after attachment to the human host, you should inspect your body for ticks twice a day when you are in areas where Lyme Disease is common. You must look thoroughly when searching for ticks. The Ixodes tick that carries Lyme Disease is very small. It is around the size of a sesame seed (picture of tick is not actual size). Removal is best done by grasping the tick by the head and pulling it out. Do not to squeeze the body of the tick. This could inject the infecting  bacteria into the bite site. Wash the area of the bite with an antiseptic solution after removal.  Lyme Disease is a disease that may affect many body systems. Because of the small size of the biting tick, most people do not notice being bitten. The first sign of an infection is usually a round red rash that extends out from the center of the tick bite. The center of the lesion may be blood colored (hemorrhagic) or have tiny blisters (vesicular). Most lesions have bright red outer borders and partial central clearing. This rash may extend out many inches in diameter, and multiple lesions may be present. Other symptoms such as fatigue, headaches, chills and fever, general achiness and swelling of lymph glands may also occur. If this first stage of the disease is left untreated, these symptoms may gradually resolve by themselves, or progressive symptoms may occur because of spread of infection to other areas of the body.  Follow up with your caregiver to have testing and treatment if you have a tick bite and you develop any of the above complaints. Your caregiver may recommend preventative (prophylactic) medications which kill bacteria (antibiotics). Once a diagnosis of Lyme Disease is made, antibiotic treatment is highly likely to cure the disease. Effective treatment of late stage Lyme Disease may require longer courses of antibiotic therapy.  MAKE SURE YOU:   Understand these instructions.   Will watch your condition.   Will get help right away if you are not doing well or get worse.  Document Released: 11/22/2000 Document Revised: 08/05/2011 Document Reviewed: 01/24/2009 [facility] Patient Information 2012 [place], [place].

## 2012-02-18 NOTE — ED Provider Notes (Signed)
History     CSN: [id number]  Arrival date & time 02/18/12  1238   First MD Initiated Contact with Patient 02/18/12 1303      Chief Complaint  Patient presents with  . Insect Bite    tick     HPI Comments: Patient discovered an embedded tick on his right lower abdomen today.  The tick was about 3/16 inch diameter and engorged.  He does not know how long the tick had been embedded.  He had felt well prior to discovering the tick, but had some mild anxiety symptoms afterwards, now resolved.  He has not observed a rash, and presently has no other symptoms.  The history is provided by the patient.    Past Medical History  Diagnosis Date  . Hypertension   . H/O colonoscopy 02/03/12    EGD as well. Mildy nodular gastritis.    Marland Kitchen COPD (chronic obstructive pulmonary disease)     History reviewed. No pertinent past surgical history.  Family History  Problem Relation Age of Onset  . Hypertension Mother   . Heart disease Father   . Hypertension Father     History  Substance Use Topics  . Smoking status: Former Smoker    Types: Cigarettes    Quit date: 08/31/2011  . Smokeless tobacco: Not on file  . Alcohol Use: No      Review of Systems  Constitutional: Negative.   HENT: Negative.   Eyes: Negative.   Respiratory: Negative.   Cardiovascular: Negative.   Gastrointestinal: Negative.   Genitourinary: Negative.   Musculoskeletal: Negative.   Skin: Negative for rash.       Tick bite abdomen  Neurological: Negative for headaches.    Allergies  Daliresp  Home Medications   Current Outpatient Rx  Name Route Sig Dispense Refill  . ALBUTEROL SULFATE (2.5 MG/3ML) 0.083% IN NEBU  USE ONE VIAL IN NEBULIZER EVERY 6 HOURS AS NEEDED 75 mL 4  . AMBULATORY NON FORMULARY MEDICATION  Medication Name: Nebulizer machine. Dx COPD. [facility]. 1 vial 0  . DOXYCYCLINE HYCLATE 100 MG PO TABS Oral Take 1 tablet (100 mg total) by mouth 2 (two) times daily. 20 tablet 0  .  FLUTICASONE-SALMETEROL 250-50 MCG/DOSE IN AEPB Inhalation Inhale 1 puff into the lungs daily. 60 each 11  . HYDRALAZINE HCL 25 MG PO TABS Oral Take 1 tablet (25 mg total) by mouth daily. 30 tablet 11  . LISINOPRIL 40 MG PO TABS Oral Take 1 tablet (40 mg total) by mouth daily. 30 tablet 11  . METOPROLOL TARTRATE 100 MG PO TABS Oral Take 1 tablet (100 mg total) by mouth 2 (two) times daily. 60 tablet 11  . PANTOPRAZOLE SODIUM 40 MG PO TBEC Oral Take 40 mg by mouth daily.    Marland Kitchen PRAVASTATIN SODIUM 20 MG PO TABS Oral Take 1 tablet (20 mg total) by mouth daily. 30 tablet 11  . PROAIR HFA 108 (90 BASE) MCG/ACT IN AERS  INHALE TWO PUFFS EVERY 6 HOURS AS NEEDED FOR WHEEZING 18 g 4  . TIOTROPIUM BROMIDE MONOHYDRATE 18 MCG IN CAPS Inhalation Place 1 capsule (18 mcg total) into inhaler and inhale daily. 30 capsule 3    BP 110/67  Pulse 60  Temp 97.7 F (36.5 C) (Oral)  Resp 20  Wt 135 lb (61.236 kg)  SpO2 93%  Physical Exam Nursing notes and Vital Signs reviewed. Appearance:  Patient appears stated age, and in no acute distress.  He has poor hearing. Eyes:  Pupils are equal, round, and reactive to light and accomodation.  Extraocular movement is intact.  Conjunctivae are not inflamed   Pharynx:  Normal Neck:  Supple.  No adenopathy Lungs:  Clear to auscultation.  Breath sounds are equal.  Heart:  Regular rate and rhythm without murmurs, rubs, or gallops.  Abdomen:  Nontender without masses or hepatosplenomegaly.  Bowel sounds are present.  No CVA or flank tenderness.  Extremities:  No edema.  Skin:  No rash noted; on the right lower evidence is evidence of a recent tick bite without evidence of infection  ED Course  Procedures  none   Labs Reviewed  ROCKY MTN SPOTTED FVR AB, IGM-BLOOD pending      1. Tick bite of abdominal wall       MDM  Since tick was engorged, and patient not sure how long it had been embedded, will begin doxycycline.         Lattie Haw, MD 02/20/12  1248

## 2012-02-18 NOTE — ED Notes (Signed)
Patient was bitten by a tick this am on his lower abdomen. Earlier today he had sweats and c/o dizziness, both have resolved. He removed the tick himself.

## 2012-02-29 DIAGNOSIS — R0609 Other forms of dyspnea: Secondary | ICD-10-CM | POA: Diagnosis not present

## 2012-02-29 DIAGNOSIS — Z79899 Other long term (current) drug therapy: Secondary | ICD-10-CM | POA: Diagnosis not present

## 2012-02-29 DIAGNOSIS — J449 Chronic obstructive pulmonary disease, unspecified: Secondary | ICD-10-CM | POA: Diagnosis not present

## 2012-02-29 DIAGNOSIS — R0602 Shortness of breath: Secondary | ICD-10-CM | POA: Diagnosis not present

## 2012-02-29 DIAGNOSIS — Z9981 Dependence on supplemental oxygen: Secondary | ICD-10-CM | POA: Diagnosis not present

## 2012-02-29 DIAGNOSIS — J209 Acute bronchitis, unspecified: Secondary | ICD-10-CM | POA: Diagnosis not present

## 2012-02-29 DIAGNOSIS — J989 Respiratory disorder, unspecified: Secondary | ICD-10-CM | POA: Diagnosis not present

## 2012-02-29 DIAGNOSIS — I10 Essential (primary) hypertension: Secondary | ICD-10-CM | POA: Diagnosis not present

## 2012-02-29 DIAGNOSIS — R0989 Other specified symptoms and signs involving the circulatory and respiratory systems: Secondary | ICD-10-CM | POA: Diagnosis not present

## 2012-03-13 ENCOUNTER — Other Ambulatory Visit: Payer: Self-pay | Admitting: Physician Assistant

## 2012-04-11 DIAGNOSIS — R0789 Other chest pain: Secondary | ICD-10-CM | POA: Diagnosis present

## 2012-04-11 DIAGNOSIS — E785 Hyperlipidemia, unspecified: Secondary | ICD-10-CM | POA: Diagnosis not present

## 2012-04-11 DIAGNOSIS — I10 Essential (primary) hypertension: Secondary | ICD-10-CM | POA: Diagnosis not present

## 2012-04-11 DIAGNOSIS — Z7982 Long term (current) use of aspirin: Secondary | ICD-10-CM | POA: Diagnosis not present

## 2012-04-11 DIAGNOSIS — R9439 Abnormal result of other cardiovascular function study: Secondary | ICD-10-CM | POA: Diagnosis present

## 2012-04-11 DIAGNOSIS — R05 Cough: Secondary | ICD-10-CM | POA: Diagnosis not present

## 2012-04-11 DIAGNOSIS — M129 Arthropathy, unspecified: Secondary | ICD-10-CM | POA: Diagnosis present

## 2012-04-11 DIAGNOSIS — H9190 Unspecified hearing loss, unspecified ear: Secondary | ICD-10-CM | POA: Diagnosis present

## 2012-04-11 DIAGNOSIS — R0602 Shortness of breath: Secondary | ICD-10-CM | POA: Diagnosis not present

## 2012-04-11 DIAGNOSIS — J9610 Chronic respiratory failure, unspecified whether with hypoxia or hypercapnia: Secondary | ICD-10-CM | POA: Diagnosis not present

## 2012-04-11 DIAGNOSIS — K219 Gastro-esophageal reflux disease without esophagitis: Secondary | ICD-10-CM | POA: Diagnosis present

## 2012-04-11 DIAGNOSIS — I369 Nonrheumatic tricuspid valve disorder, unspecified: Secondary | ICD-10-CM | POA: Diagnosis not present

## 2012-04-11 DIAGNOSIS — F17200 Nicotine dependence, unspecified, uncomplicated: Secondary | ICD-10-CM | POA: Diagnosis present

## 2012-04-11 DIAGNOSIS — J441 Chronic obstructive pulmonary disease with (acute) exacerbation: Secondary | ICD-10-CM | POA: Diagnosis not present

## 2012-04-11 DIAGNOSIS — R9430 Abnormal result of cardiovascular function study, unspecified: Secondary | ICD-10-CM | POA: Diagnosis not present

## 2012-04-11 DIAGNOSIS — R079 Chest pain, unspecified: Secondary | ICD-10-CM | POA: Diagnosis not present

## 2012-04-12 LAB — HEPATIC FUNCTION PANEL
AST: 19 U/L (ref 14–40)
Alkaline Phosphatase: 72 U/L (ref 25–125)
Bilirubin, Total: 0.3 mg/dL

## 2012-04-12 LAB — BASIC METABOLIC PANEL
BUN: 10 mg/dL (ref 4–21)
Sodium: 139 mmol/L (ref 137–147)

## 2012-04-12 LAB — CBC AND DIFFERENTIAL: HCT: 43 % (ref 41–53)

## 2012-04-14 DIAGNOSIS — IMO0001 Reserved for inherently not codable concepts without codable children: Secondary | ICD-10-CM

## 2012-04-14 HISTORY — DX: Reserved for inherently not codable concepts without codable children: IMO0001

## 2012-04-19 ENCOUNTER — Ambulatory Visit: Payer: Medicare Other | Admitting: Family Medicine

## 2012-04-24 ENCOUNTER — Ambulatory Visit (INDEPENDENT_AMBULATORY_CARE_PROVIDER_SITE_OTHER): Payer: Medicare Other | Admitting: Family Medicine

## 2012-04-24 ENCOUNTER — Encounter: Payer: Self-pay | Admitting: Family Medicine

## 2012-04-24 VITALS — BP 110/60 | HR 64 | Wt 135.0 lb

## 2012-04-24 DIAGNOSIS — J449 Chronic obstructive pulmonary disease, unspecified: Secondary | ICD-10-CM | POA: Diagnosis not present

## 2012-04-24 DIAGNOSIS — J4489 Other specified chronic obstructive pulmonary disease: Secondary | ICD-10-CM

## 2012-04-24 DIAGNOSIS — R0789 Other chest pain: Secondary | ICD-10-CM | POA: Diagnosis not present

## 2012-04-24 NOTE — Patient Instructions (Addendum)
Make sure to keep her followup with cardiology. You can use the Symbicort 160/4.5,  2 puffs twice a day instead of Advair for a few days to see if you like it better.

## 2012-04-24 NOTE — Progress Notes (Addendum)
  Subjective:    Patient ID: Jared Lee, male    DOB: 1941-04-01, 71 y.o.   MRN: [id number]  HPI Was admitted to [facility] for CP and SOB.    Jared Lee to the ED and told his heart was beating too fast. No pain. No MI.  He is wearing his oxygen for his COPD.  He saw symbicort on TV and wants to know if it would help him.  He is currently on cefdinir abx as well. He was admitted for 4 days.     Review of Systems     Objective:   Physical Exam  Constitutional: He is oriented to person, place, and time. He appears well-developed and well-nourished.  HENT:  Head: Normocephalic and atraumatic.  Right Ear: External ear normal.  Left Ear: External ear normal.  Nose: Nose normal.  Mouth/Throat: Oropharynx is clear and moist.       TMs and canals are clear.   Eyes: Conjunctivae and EOM are normal. Pupils are equal, round, and reactive to light.  Neck: Neck supple. No thyromegaly present.  Cardiovascular: Normal rate and normal heart sounds.   Pulmonary/Chest: Effort normal and breath sounds normal.  Lymphadenopathy:    He has no cervical adenopathy.  Neurological: He is alert and oriented to person, place, and time.  Skin: Skin is warm and dry.  Psychiatric: He has a normal mood and affect.          Assessment & Plan:  Atypical Chest Pain - Discussed that he is doing better. Will need d/c summary from [facility]. Though I explained to him that these are fairly equivalent drugs. He can finish the Cefdinir antibiotic as well. Keep appt with cardiology.    COPD - Uncontrolled.  Continue home  Oxygen 3 liters.  I. did give him a sample of Symbicort 160/4.5 to try in place of his Advair for a few days to see if he likes it better. Though I explained to him that these are fairly equivalent drugs. F/U in 2 months.   Addendum : I finally received d/c paperwork from hosptial on 04/26/12. He was admitted for Acute COPD exacerbation and chest pain. Had a neg stress test.   Admiteed on 8/14 and d/c home on 04/15/12. D/c home on Omnicef.  Note, had allergy to levaquin while in hospital.

## 2012-04-25 ENCOUNTER — Encounter: Payer: Self-pay | Admitting: Family Medicine

## 2012-04-26 ENCOUNTER — Encounter: Payer: Self-pay | Admitting: Family Medicine

## 2012-04-29 ENCOUNTER — Other Ambulatory Visit: Payer: Self-pay | Admitting: Family Medicine

## 2012-05-07 ENCOUNTER — Other Ambulatory Visit: Payer: Self-pay | Admitting: Family Medicine

## 2012-05-08 ENCOUNTER — Other Ambulatory Visit: Payer: Self-pay | Admitting: Family Medicine

## 2012-05-08 MED ORDER — ALBUTEROL SULFATE (2.5 MG/3ML) 0.083% IN NEBU: 2.5000 mg | INHALATION_SOLUTION | Freq: Four times a day (QID) | RESPIRATORY_TRACT | Status: DC | PRN

## 2012-05-12 ENCOUNTER — Other Ambulatory Visit: Payer: Self-pay | Admitting: *Deleted

## 2012-05-12 MED ORDER — ALBUTEROL SULFATE (2.5 MG/3ML) 0.083% IN NEBU: 2.5000 mg | INHALATION_SOLUTION | Freq: Four times a day (QID) | RESPIRATORY_TRACT | Status: DC | PRN

## 2012-05-16 ENCOUNTER — Other Ambulatory Visit: Payer: Self-pay | Admitting: *Deleted

## 2012-05-16 MED ORDER — METOPROLOL TARTRATE 100 MG PO TABS: 100.0000 mg | ORAL_TABLET | Freq: Two times a day (BID) | ORAL | Status: DC

## 2012-05-18 DIAGNOSIS — R0789 Other chest pain: Secondary | ICD-10-CM | POA: Diagnosis not present

## 2012-05-18 DIAGNOSIS — J449 Chronic obstructive pulmonary disease, unspecified: Secondary | ICD-10-CM | POA: Diagnosis not present

## 2012-05-18 DIAGNOSIS — I10 Essential (primary) hypertension: Secondary | ICD-10-CM | POA: Diagnosis not present

## 2012-05-21 DIAGNOSIS — E785 Hyperlipidemia, unspecified: Secondary | ICD-10-CM | POA: Diagnosis not present

## 2012-05-21 DIAGNOSIS — J441 Chronic obstructive pulmonary disease with (acute) exacerbation: Secondary | ICD-10-CM | POA: Diagnosis not present

## 2012-05-21 DIAGNOSIS — Z79899 Other long term (current) drug therapy: Secondary | ICD-10-CM | POA: Diagnosis not present

## 2012-05-21 DIAGNOSIS — Z9981 Dependence on supplemental oxygen: Secondary | ICD-10-CM | POA: Diagnosis not present

## 2012-05-21 DIAGNOSIS — K219 Gastro-esophageal reflux disease without esophagitis: Secondary | ICD-10-CM | POA: Diagnosis not present

## 2012-05-21 DIAGNOSIS — J449 Chronic obstructive pulmonary disease, unspecified: Secondary | ICD-10-CM | POA: Diagnosis not present

## 2012-05-21 DIAGNOSIS — R05 Cough: Secondary | ICD-10-CM | POA: Diagnosis not present

## 2012-05-21 DIAGNOSIS — R0602 Shortness of breath: Secondary | ICD-10-CM | POA: Diagnosis not present

## 2012-05-21 DIAGNOSIS — H9190 Unspecified hearing loss, unspecified ear: Secondary | ICD-10-CM | POA: Diagnosis not present

## 2012-05-21 DIAGNOSIS — M129 Arthropathy, unspecified: Secondary | ICD-10-CM | POA: Diagnosis not present

## 2012-05-21 DIAGNOSIS — I10 Essential (primary) hypertension: Secondary | ICD-10-CM | POA: Diagnosis not present

## 2012-05-21 DIAGNOSIS — Z23 Encounter for immunization: Secondary | ICD-10-CM | POA: Diagnosis not present

## 2012-05-22 ENCOUNTER — Encounter: Payer: Self-pay | Admitting: *Deleted

## 2012-05-22 DIAGNOSIS — I10 Essential (primary) hypertension: Secondary | ICD-10-CM | POA: Diagnosis not present

## 2012-05-22 DIAGNOSIS — J449 Chronic obstructive pulmonary disease, unspecified: Secondary | ICD-10-CM | POA: Diagnosis not present

## 2012-05-22 DIAGNOSIS — M129 Arthropathy, unspecified: Secondary | ICD-10-CM | POA: Diagnosis not present

## 2012-05-22 DIAGNOSIS — J441 Chronic obstructive pulmonary disease with (acute) exacerbation: Secondary | ICD-10-CM | POA: Diagnosis not present

## 2012-05-22 DIAGNOSIS — E785 Hyperlipidemia, unspecified: Secondary | ICD-10-CM | POA: Diagnosis not present

## 2012-05-23 DIAGNOSIS — M129 Arthropathy, unspecified: Secondary | ICD-10-CM | POA: Diagnosis not present

## 2012-05-23 DIAGNOSIS — I10 Essential (primary) hypertension: Secondary | ICD-10-CM | POA: Diagnosis not present

## 2012-05-23 DIAGNOSIS — J441 Chronic obstructive pulmonary disease with (acute) exacerbation: Secondary | ICD-10-CM | POA: Diagnosis not present

## 2012-05-23 DIAGNOSIS — E785 Hyperlipidemia, unspecified: Secondary | ICD-10-CM | POA: Diagnosis not present

## 2012-05-26 ENCOUNTER — Ambulatory Visit (INDEPENDENT_AMBULATORY_CARE_PROVIDER_SITE_OTHER): Payer: Medicare Other | Admitting: Family Medicine

## 2012-05-26 ENCOUNTER — Encounter: Payer: Self-pay | Admitting: Family Medicine

## 2012-05-26 VITALS — BP 131/77 | HR 90 | Wt 135.0 lb

## 2012-05-26 DIAGNOSIS — J441 Chronic obstructive pulmonary disease with (acute) exacerbation: Secondary | ICD-10-CM

## 2012-05-26 MED ORDER — LISINOPRIL 40 MG PO TABS: 40.0000 mg | ORAL_TABLET | Freq: Every day | ORAL | Status: DC

## 2012-05-26 NOTE — Progress Notes (Signed)
  Subjective:    Patient ID: Jared Lee, male    DOB: 04-01-1941, 71 y.o.   MRN: [id number]  HPI [facility] to [facility].  Went to ED on Sunday and released on Tuesday.  Is on an antibiotic. Feels better.  Sounds like he may have had   COPD exacerbation.  He is interested in a lung transplant. Says he feels better when it is cool outside.  Got his flu shot there.    Review of Systems     Objective:   Physical Exam  Constitutional: He is oriented to person, place, and time. He appears well-developed and well-nourished.  HENT:  Head: Normocephalic and atraumatic.  Cardiovascular: Normal rate, regular rhythm and normal heart sounds.   Pulmonary/Chest: Effort normal. No respiratory distress. He has no wheezes.       Poor air movment.   Neurological: He is alert and oriented to person, place, and time.  Skin: Skin is warm and dry.  Psychiatric: He has a normal mood and affect. His behavior is normal.          Assessment & Plan:  COPD - much improved since his hospitalization earlier this week. With frequenct exacerbation. Will refer to [facility] for further evaluation.  The heat makes him worse.  I think he would be a great candidate for pulm rehab. Complete the antibiotic.  On 3 L.  He is back to baseline.  Awaiting records from [facility].

## 2012-06-13 ENCOUNTER — Ambulatory Visit (INDEPENDENT_AMBULATORY_CARE_PROVIDER_SITE_OTHER): Payer: Medicare Other | Admitting: Pulmonary Disease

## 2012-06-13 ENCOUNTER — Encounter: Payer: Self-pay | Admitting: Pulmonary Disease

## 2012-06-13 ENCOUNTER — Telehealth: Payer: Self-pay | Admitting: *Deleted

## 2012-06-13 VITALS — BP 114/66 | HR 76 | Temp 97.8°F | Ht 71.0 in | Wt 144.0 lb

## 2012-06-13 DIAGNOSIS — J449 Chronic obstructive pulmonary disease, unspecified: Secondary | ICD-10-CM | POA: Diagnosis not present

## 2012-06-13 NOTE — Progress Notes (Signed)
Subjective:    Patient ID: Jared Lee, male    DOB: April 03, 1941, 71 y.o.   MRN: [id number]  HPI 71 y.o smoker quit 2/13 referred for evaluation of COPD With frequent exacerbation. He is very hard of hearing & accompanied by his caregiver/stepson today. He was hospitalised in 8/13 , then again in 9/13 to [facility] for COPD exacerbations. Treated with steroids & antibiotics. Of note, he had a low risk stress test with a fixed inferior defect. CT angiogram was neg for PE in 6/13. ABG was 7.40/42/86 on 3 L [place] with Co2 of 30 on BMET. He has been maintained on home O2 [facility] ) since feb'12. He is compliant with this during sleep & prn daytime & this helps.  He is much improved since his  hospitalization .  He is able to walk about 1/4 mile daily. Has cough with clear sputum esp am, but denies wheezing. CXR 8/13 is reported as hyperinflated,  clear without infx.  He wonders if he is a candidate for lung transplant. He smoked > 45 Pyrs before quitting in feb '13   Spirometry showed FEv1 27% (0.94), FVC 41%  Of note, daliresp caused headache & diarrhea, levaquin is listed as allergy.  Past Medical History  Diagnosis Date  . Hypertension   . H/O colonoscopy 02/03/12    EGD as well. Mildy nodular gastritis.    Marland Kitchen COPD (chronic obstructive pulmonary disease)   . Normal cardiac stress test 04/14/12    [facility]  . Hyperlipemia    No past surgical history on file.  Allergies  Allergen Reactions  . Daliresp (Roflumilast) Other (See Comments)    HA, diarrhea   . Levaquin (Levofloxacin)    History   Social History  . Marital Status: Single    Spouse Name: N/A    Number of Children: N/A  . Years of Education: N/A   Occupational History  . Not on file.   Social History Main Topics  . Smoking status: Former Smoker -- 1.0 packs/day for 66 years    Types: Cigarettes    Quit date: 08/31/2011  . Smokeless tobacco: Never Used  . Alcohol Use: No  . Drug Use: No  . Sexually Active: Not on  file   Other Topics Concern  . Not on file   Social History Narrative  . No narrative on file     Review of Systems  Constitutional: Negative for fever and unexpected weight change.  HENT: Positive for sinus pressure. Negative for ear pain, nosebleeds, congestion, sore throat, rhinorrhea, sneezing, trouble swallowing, dental problem and postnasal drip.   Eyes: Negative for redness and itching.  Respiratory: Positive for cough and shortness of breath. Negative for chest tightness and wheezing.   Cardiovascular: Positive for leg swelling. Negative for palpitations.  Gastrointestinal: Negative for nausea and vomiting.  Genitourinary: Negative for dysuria.  Musculoskeletal: Negative for joint swelling.  Skin: Negative for rash.  Neurological: Negative for headaches.  Hematological: Bruises/bleeds easily.  Psychiatric/Behavioral: Negative for dysphoric mood. The patient is not nervous/anxious.        Objective:   Physical Exam  Gen. Pleasant, thin man, in no distress, normal affect ENT - no lesions, no post nasal drip Neck: No JVD, no thyromegaly, no carotid bruits Lungs: no use of accessory muscles, no dullness to percussion, clear without rales or rhonchi  Cardiovascular: Rhythm regular, heart sounds  normal, no murmurs or gallops, no peripheral edema Abdomen: soft and non-tender, no hepatosplenomegaly, BS normal. Musculoskeletal: No  deformities, no cyanosis or clubbing Neuro:  alert, non focal       Assessment & Plan:

## 2012-06-13 NOTE — Assessment & Plan Note (Signed)
Gold D fev1 27% 10/13   Stay on advair & spiriva He would benefit from a lung rehab (exercise) program but I could not talk him into it. We discussed early signs of a COPD flare - such as wheezing or yellow-green phlegm - Call us early if this happens,maybe we can give you meds to prevent a hospital visit Not a candidate for lung transplant

## 2012-06-13 NOTE — Telephone Encounter (Signed)
Dr. Vassie Loll let pt go.  He needs a f/u visit scheduled with TP in 4 wks in [place].  LMOMTCB to schedule this.

## 2012-06-13 NOTE — Patient Instructions (Addendum)
Stay on advair & spiriva You will benefit from a lung rehab (exercise) program We discussed early signs of a COPD flare - such as wheezing or yellow-green phlegm - Call us early if this happens,maybe we can give you meds to prevent a hospital visit

## 2012-06-14 NOTE — Telephone Encounter (Signed)
Appt. Made w/ TP on 11/19 @ 2:00pm

## 2012-06-14 NOTE — Telephone Encounter (Signed)
[email or web address]  

## 2012-06-16 ENCOUNTER — Other Ambulatory Visit: Payer: Self-pay | Admitting: *Deleted

## 2012-06-16 ENCOUNTER — Other Ambulatory Visit: Payer: Self-pay | Admitting: Family Medicine

## 2012-06-16 MED ORDER — ALBUTEROL SULFATE HFA 108 (90 BASE) MCG/ACT IN AERS: 2.0000 | INHALATION_SPRAY | Freq: Four times a day (QID) | RESPIRATORY_TRACT | Status: DC | PRN

## 2012-06-19 ENCOUNTER — Other Ambulatory Visit: Payer: Self-pay | Admitting: *Deleted

## 2012-06-20 ENCOUNTER — Other Ambulatory Visit: Payer: Self-pay | Admitting: *Deleted

## 2012-06-20 MED ORDER — OMEPRAZOLE 40 MG PO CPDR: 40.0000 mg | DELAYED_RELEASE_CAPSULE | Freq: Every day | ORAL | Status: DC

## 2012-06-21 ENCOUNTER — Telehealth: Payer: Self-pay | Admitting: *Deleted

## 2012-06-22 ENCOUNTER — Other Ambulatory Visit: Payer: Self-pay | Admitting: *Deleted

## 2012-07-08 DIAGNOSIS — R1084 Generalized abdominal pain: Secondary | ICD-10-CM | POA: Diagnosis not present

## 2012-07-08 DIAGNOSIS — R079 Chest pain, unspecified: Secondary | ICD-10-CM | POA: Diagnosis not present

## 2012-07-08 DIAGNOSIS — R112 Nausea with vomiting, unspecified: Secondary | ICD-10-CM | POA: Diagnosis not present

## 2012-07-08 DIAGNOSIS — Z87891 Personal history of nicotine dependence: Secondary | ICD-10-CM | POA: Diagnosis not present

## 2012-07-08 DIAGNOSIS — Z7982 Long term (current) use of aspirin: Secondary | ICD-10-CM | POA: Diagnosis not present

## 2012-07-08 DIAGNOSIS — Z9981 Dependence on supplemental oxygen: Secondary | ICD-10-CM | POA: Diagnosis not present

## 2012-07-08 DIAGNOSIS — I10 Essential (primary) hypertension: Secondary | ICD-10-CM | POA: Diagnosis not present

## 2012-07-08 DIAGNOSIS — M1990 Unspecified osteoarthritis, unspecified site: Secondary | ICD-10-CM | POA: Diagnosis not present

## 2012-07-08 DIAGNOSIS — R0989 Other specified symptoms and signs involving the circulatory and respiratory systems: Secondary | ICD-10-CM | POA: Diagnosis not present

## 2012-07-08 DIAGNOSIS — M159 Polyosteoarthritis, unspecified: Secondary | ICD-10-CM | POA: Diagnosis not present

## 2012-07-08 DIAGNOSIS — K219 Gastro-esophageal reflux disease without esophagitis: Secondary | ICD-10-CM | POA: Diagnosis not present

## 2012-07-08 DIAGNOSIS — J441 Chronic obstructive pulmonary disease with (acute) exacerbation: Secondary | ICD-10-CM | POA: Diagnosis not present

## 2012-07-08 DIAGNOSIS — E785 Hyperlipidemia, unspecified: Secondary | ICD-10-CM | POA: Diagnosis present

## 2012-07-08 DIAGNOSIS — R0609 Other forms of dyspnea: Secondary | ICD-10-CM | POA: Diagnosis not present

## 2012-07-08 DIAGNOSIS — H9190 Unspecified hearing loss, unspecified ear: Secondary | ICD-10-CM | POA: Diagnosis not present

## 2012-07-08 DIAGNOSIS — Z79899 Other long term (current) drug therapy: Secondary | ICD-10-CM | POA: Diagnosis not present

## 2012-07-12 NOTE — Telephone Encounter (Signed)
Error  See other note

## 2012-07-14 ENCOUNTER — Other Ambulatory Visit: Payer: Self-pay | Admitting: *Deleted

## 2012-07-14 MED ORDER — TIOTROPIUM BROMIDE MONOHYDRATE 18 MCG IN CAPS: 18.0000 ug | ORAL_CAPSULE | Freq: Every day | RESPIRATORY_TRACT | Status: DC

## 2012-07-14 MED ORDER — FLUTICASONE-SALMETEROL 250-50 MCG/DOSE IN AEPB: 1.0000 | INHALATION_SPRAY | Freq: Two times a day (BID) | RESPIRATORY_TRACT | Status: DC

## 2012-07-17 ENCOUNTER — Ambulatory Visit (INDEPENDENT_AMBULATORY_CARE_PROVIDER_SITE_OTHER): Payer: Medicare Other | Admitting: Family Medicine

## 2012-07-17 ENCOUNTER — Encounter: Payer: Self-pay | Admitting: Family Medicine

## 2012-07-17 VITALS — BP 115/64 | HR 76 | Ht 71.0 in | Wt 144.0 lb

## 2012-07-17 DIAGNOSIS — J441 Chronic obstructive pulmonary disease with (acute) exacerbation: Secondary | ICD-10-CM | POA: Diagnosis not present

## 2012-07-17 NOTE — Progress Notes (Addendum)
  Subjective:    Patient ID: Jared Lee, male    DOB: July 21, 1941, 71 y.o.   MRN: [id number]  HPI Recenlty admitted to [facility] for COPD exacerbation on 07/08/12 by Dr. Erlene Senters.   f he was given IV steroids and IV ceftriaxone. Here for f/u of his COPD.  Says does better in th ewinter. No fever. Says sputum production 8is better. He is too old for a lung transplant.  He is on 3 liters of oxygen.  He is taking his advair and spiriva regularly.     Review of Systems     Objective:   Physical Exam  Constitutional: He is oriented to person, place, and time. He appears well-developed and well-nourished.  HENT:  Head: Normocephalic and atraumatic.  Cardiovascular: Normal rate, regular rhythm and normal heart sounds.   Pulmonary/Chest: Effort normal and breath sounds normal.       Diffuse coarse BS  Neurological: He is alert and oriented to person, place, and time.  Skin: Skin is warm and dry.  Psychiatric: He has a normal mood and affect. His behavior is normal.          Assessment & Plan:  COPD exacerbation - he's doing as best as he can have current regimen. Continue Spiriva and Advair. Complete the rest of the steroid taper. He has about 2 days left. Due home oxygen level of 3 L. His medications haven't changed normal times and I think for now this is one of his best regimens. Unfortunately he could not take Daliresp. Continue using flutter valve to help move the mucous.. have low threshold to refill his steroids if needed.  I explained to him that sometimes we need to wean steroids slowly so that he is less likely to have a repeat exacerbation of his COPD.

## 2012-07-18 ENCOUNTER — Ambulatory Visit: Payer: Medicare Other | Admitting: Adult Health

## 2012-08-18 ENCOUNTER — Telehealth: Payer: Self-pay | Admitting: *Deleted

## 2012-08-18 NOTE — Telephone Encounter (Signed)
Megan with [facility] calls and states this patients stepson Jose told her he has an upcoming appt with you and she wanted you to document about his O2 for recertification

## 2012-08-21 ENCOUNTER — Telehealth: Payer: Self-pay | Admitting: Family Medicine

## 2012-08-21 NOTE — Telephone Encounter (Signed)
[facility] called and wanted me to remind you to document this patient's oxygen use when he comes back to see you on 09-18-12 so they can get a recirt for Medicare. Thanks, [employer]

## 2012-08-25 ENCOUNTER — Ambulatory Visit: Payer: Medicare Other | Admitting: Family Medicine

## 2012-08-26 DIAGNOSIS — R059 Cough, unspecified: Secondary | ICD-10-CM | POA: Diagnosis not present

## 2012-08-26 DIAGNOSIS — J3489 Other specified disorders of nose and nasal sinuses: Secondary | ICD-10-CM | POA: Diagnosis not present

## 2012-08-26 DIAGNOSIS — R0602 Shortness of breath: Secondary | ICD-10-CM | POA: Diagnosis not present

## 2012-08-26 DIAGNOSIS — R062 Wheezing: Secondary | ICD-10-CM | POA: Diagnosis not present

## 2012-08-26 DIAGNOSIS — J4489 Other specified chronic obstructive pulmonary disease: Secondary | ICD-10-CM | POA: Diagnosis not present

## 2012-08-26 DIAGNOSIS — R0609 Other forms of dyspnea: Secondary | ICD-10-CM | POA: Diagnosis not present

## 2012-08-26 DIAGNOSIS — J449 Chronic obstructive pulmonary disease, unspecified: Secondary | ICD-10-CM | POA: Diagnosis not present

## 2012-08-26 DIAGNOSIS — R05 Cough: Secondary | ICD-10-CM | POA: Diagnosis not present

## 2012-09-12 ENCOUNTER — Other Ambulatory Visit: Payer: Self-pay | Admitting: Family Medicine

## 2012-09-13 ENCOUNTER — Other Ambulatory Visit: Payer: Self-pay | Admitting: *Deleted

## 2012-09-13 MED ORDER — ALBUTEROL SULFATE (2.5 MG/3ML) 0.083% IN NEBU: 2.5000 mg | INHALATION_SOLUTION | Freq: Four times a day (QID) | RESPIRATORY_TRACT | Status: DC | PRN

## 2012-09-18 ENCOUNTER — Ambulatory Visit (INDEPENDENT_AMBULATORY_CARE_PROVIDER_SITE_OTHER): Payer: Medicare Other | Admitting: Family Medicine

## 2012-09-18 ENCOUNTER — Other Ambulatory Visit: Payer: Self-pay

## 2012-09-18 ENCOUNTER — Encounter: Payer: Self-pay | Admitting: Family Medicine

## 2012-09-18 VITALS — BP 143/76 | HR 76 | Resp 18 | Wt 148.0 lb

## 2012-09-18 DIAGNOSIS — J441 Chronic obstructive pulmonary disease with (acute) exacerbation: Secondary | ICD-10-CM | POA: Diagnosis not present

## 2012-09-18 DIAGNOSIS — J449 Chronic obstructive pulmonary disease, unspecified: Secondary | ICD-10-CM

## 2012-09-18 MED ORDER — ALBUTEROL SULFATE HFA 108 (90 BASE) MCG/ACT IN AERS: 2.0000 | INHALATION_SPRAY | Freq: Four times a day (QID) | RESPIRATORY_TRACT | Status: DC | PRN

## 2012-09-18 MED ORDER — METHYLPREDNISOLONE SODIUM SUCC 125 MG IJ SOLR
125.0000 mg | Freq: Once | INTRAMUSCULAR | Status: AC
  Administered 2012-09-18: 125 mg via INTRAMUSCULAR

## 2012-09-18 MED ORDER — ALBUTEROL SULFATE (5 MG/ML) 0.5% IN NEBU
2.5000 mg | INHALATION_SOLUTION | Freq: Once | RESPIRATORY_TRACT | Status: AC
  Administered 2012-09-18: 2.5 mg via RESPIRATORY_TRACT

## 2012-09-18 MED ORDER — IPRATROPIUM BROMIDE 0.02 % IN SOLN
0.5000 mg | Freq: Once | RESPIRATORY_TRACT | Status: AC
  Administered 2012-09-18: 0.25 mg via RESPIRATORY_TRACT

## 2012-09-18 NOTE — Progress Notes (Signed)
  Subjective:    Patient ID: Jared Lee, male    DOB: 1941/02/02, 72 y.o.   MRN: [id number]  HPI COPD - Using his oxygen 18-20 hours a day. He is home bound.  He is benefiting from his oxygen.  He does have a portable system and is using it in his home and when he leaves for doctors appointments, pharmacy trips, shopping, etc.  Usually has it on 2.5 liters.  Using his breathing tx eveyr 5 hours. No fever or URI sxs. No change in cough.  No change in sputum production. He has noticed he's had a little bit more difficulty with breathing over the cold winter. His been a couple months it's he was in the hospital for his COPD. He did wear his oxygen into the office today it has it set at 2.5 L.   Review of Systems     Objective:   Physical Exam  Constitutional: He is oriented to person, place, and time. He appears well-developed and well-nourished.  HENT:  Head: Normocephalic and atraumatic.  Neck: Neck supple. No thyromegaly present.  Cardiovascular: Normal rate, regular rhythm and normal heart sounds.   Pulmonary/Chest: Effort normal. No respiratory distress. He has wheezes.       He has some wheezing on exam today. No crackles. Mild rhonchi. He seems to be working a little harder to breathe in usual.  Lymphadenopathy:    He has no cervical adenopathy.  Neurological: He is alert and oriented to person, place, and time.  Skin: Skin is warm and dry.  Psychiatric: He has a normal mood and affect. His behavior is normal.          Assessment & Plan:  Severe COPD-he's really on maximal regimen for all of his medications. He is doing well on his home oxygen. He does use a portal oxygen and in his home. He has it set at 2.5 L and is benefiting greatly as he does have severe COPD and is oxygen dependent. Followup in one month. Sent refill on his proair which is him is that of.  COPD exacerbation - given nebulizer treatment of albuterol and Atrovent here in the office. Patient tolerated it well.  He felt he could breathe better afterwards. Also given 125 mg of Solu-Medrol. He is to call in one week to let me know if he feels he is getting worse again. I did not put him on antibiotics because he denied any change in cough or sputum production. Certainly if he gets more short of breath overnight or on the weekend and presented to the urgent care or emergency room.

## 2012-09-30 DIAGNOSIS — J4489 Other specified chronic obstructive pulmonary disease: Secondary | ICD-10-CM | POA: Diagnosis not present

## 2012-09-30 DIAGNOSIS — Z881 Allergy status to other antibiotic agents status: Secondary | ICD-10-CM | POA: Diagnosis not present

## 2012-09-30 DIAGNOSIS — R0789 Other chest pain: Secondary | ICD-10-CM | POA: Diagnosis not present

## 2012-09-30 DIAGNOSIS — R0609 Other forms of dyspnea: Secondary | ICD-10-CM | POA: Diagnosis not present

## 2012-09-30 DIAGNOSIS — Z9981 Dependence on supplemental oxygen: Secondary | ICD-10-CM | POA: Diagnosis not present

## 2012-09-30 DIAGNOSIS — J449 Chronic obstructive pulmonary disease, unspecified: Secondary | ICD-10-CM | POA: Diagnosis not present

## 2012-09-30 DIAGNOSIS — E785 Hyperlipidemia, unspecified: Secondary | ICD-10-CM | POA: Diagnosis not present

## 2012-09-30 DIAGNOSIS — Z888 Allergy status to other drugs, medicaments and biological substances status: Secondary | ICD-10-CM | POA: Diagnosis not present

## 2012-09-30 DIAGNOSIS — Z87891 Personal history of nicotine dependence: Secondary | ICD-10-CM | POA: Diagnosis not present

## 2012-09-30 DIAGNOSIS — R0902 Hypoxemia: Secondary | ICD-10-CM | POA: Diagnosis not present

## 2012-09-30 DIAGNOSIS — J441 Chronic obstructive pulmonary disease with (acute) exacerbation: Secondary | ICD-10-CM | POA: Diagnosis not present

## 2012-09-30 DIAGNOSIS — K219 Gastro-esophageal reflux disease without esophagitis: Secondary | ICD-10-CM | POA: Diagnosis not present

## 2012-09-30 DIAGNOSIS — I10 Essential (primary) hypertension: Secondary | ICD-10-CM | POA: Diagnosis not present

## 2012-09-30 DIAGNOSIS — R0989 Other specified symptoms and signs involving the circulatory and respiratory systems: Secondary | ICD-10-CM | POA: Diagnosis not present

## 2012-10-10 ENCOUNTER — Ambulatory Visit: Payer: Medicare Other | Admitting: Family Medicine

## 2012-10-10 DIAGNOSIS — I10 Essential (primary) hypertension: Secondary | ICD-10-CM | POA: Diagnosis not present

## 2012-10-10 DIAGNOSIS — R0602 Shortness of breath: Secondary | ICD-10-CM | POA: Diagnosis not present

## 2012-10-10 DIAGNOSIS — J449 Chronic obstructive pulmonary disease, unspecified: Secondary | ICD-10-CM | POA: Diagnosis not present

## 2012-10-10 DIAGNOSIS — Z13820 Encounter for screening for osteoporosis: Secondary | ICD-10-CM | POA: Diagnosis not present

## 2012-10-10 DIAGNOSIS — Z79899 Other long term (current) drug therapy: Secondary | ICD-10-CM | POA: Diagnosis not present

## 2012-10-15 ENCOUNTER — Other Ambulatory Visit: Payer: Self-pay | Admitting: Family Medicine

## 2012-10-16 ENCOUNTER — Encounter: Payer: Self-pay | Admitting: Family Medicine

## 2012-10-16 ENCOUNTER — Ambulatory Visit (INDEPENDENT_AMBULATORY_CARE_PROVIDER_SITE_OTHER): Payer: Medicare Other | Admitting: Family Medicine

## 2012-10-16 VITALS — BP 131/65 | HR 75 | Wt 145.0 lb

## 2012-10-16 DIAGNOSIS — J441 Chronic obstructive pulmonary disease with (acute) exacerbation: Secondary | ICD-10-CM

## 2012-10-16 NOTE — Progress Notes (Signed)
  Subjective:    Patient ID: Jared Lee, male    DOB: 19-Jun-1941, 72 y.o.   MRN: [id number]  HPI He was seen for COPD exacerbation at [facility] for 09/30/12.  D/C on 10/04/11.  He is feeing better. He is on antibiotics.  Using neb every 5-6 hours.  He is on prednisone again as well. Thinks a cold triggered his sxs.  On oxygen 2L. Had fever initially but that has resolved.  He was d/c home on zpack.  Had a normal CXR.   Hosp records reviewed from [facility]   Review of Systems     Objective:   Physical Exam  Constitutional: He is oriented to person, place, and time. He appears well-developed and well-nourished.  HENT:  Head: Normocephalic and atraumatic.  Right Ear: External ear normal.  Left Ear: External ear normal.  Nose: Nose normal.  Mouth/Throat: Oropharynx is clear and moist.  TMs and canals are clear.   Eyes: Conjunctivae and EOM are normal. Pupils are equal, round, and reactive to light.  Neck: Neck supple. No thyromegaly present.  Cardiovascular: Normal rate, regular rhythm and normal heart sounds.   Pulmonary/Chest: Effort normal and breath sounds normal.  Prolonged expiration.   Lymphadenopathy:    He has no cervical adenopathy.  Neurological: He is alert and oriented to person, place, and time.  Skin: Skin is warm and dry.  Psychiatric: He has a normal mood and affect. His behavior is normal.          Assessment & Plan:  COPD exacerbation- BAck to baseline.  He is doing nebs every 5-6 hours.  No fevers.  Sputum production is minimal.  CXR was normal. Lung exam is at baseline today with no crackles or rhonchi. He told me he is on [facility] but d/c summary said Advair. As long as he is on one in addition to his spiriva.  F/U in 6-8 weeks.

## 2012-10-18 ENCOUNTER — Telehealth: Payer: Self-pay | Admitting: Family Medicine

## 2012-10-18 ENCOUNTER — Other Ambulatory Visit: Payer: Self-pay | Admitting: Family Medicine

## 2012-10-18 NOTE — Telephone Encounter (Signed)
Please call patient: I got a notification from his pharmacy that he has failed Prilosec which is omeprazole and interpersonal. Both of these medications basically do the same thing. I recommend that he take 1 in stay was just that one. The omeprazole would probably be less expensive overall.

## 2012-10-18 NOTE — Telephone Encounter (Signed)
Left detailed message.   

## 2012-11-03 DIAGNOSIS — Z9981 Dependence on supplemental oxygen: Secondary | ICD-10-CM | POA: Diagnosis not present

## 2012-11-03 DIAGNOSIS — R059 Cough, unspecified: Secondary | ICD-10-CM | POA: Diagnosis not present

## 2012-11-03 DIAGNOSIS — J4489 Other specified chronic obstructive pulmonary disease: Secondary | ICD-10-CM | POA: Diagnosis not present

## 2012-11-03 DIAGNOSIS — R05 Cough: Secondary | ICD-10-CM | POA: Diagnosis not present

## 2012-11-03 DIAGNOSIS — J209 Acute bronchitis, unspecified: Secondary | ICD-10-CM | POA: Diagnosis not present

## 2012-11-03 DIAGNOSIS — Z881 Allergy status to other antibiotic agents status: Secondary | ICD-10-CM | POA: Diagnosis not present

## 2012-11-03 DIAGNOSIS — R0609 Other forms of dyspnea: Secondary | ICD-10-CM | POA: Diagnosis not present

## 2012-11-03 DIAGNOSIS — J440 Chronic obstructive pulmonary disease with acute lower respiratory infection: Secondary | ICD-10-CM | POA: Diagnosis not present

## 2012-11-03 DIAGNOSIS — K219 Gastro-esophageal reflux disease without esophagitis: Secondary | ICD-10-CM | POA: Diagnosis not present

## 2012-11-03 DIAGNOSIS — J9610 Chronic respiratory failure, unspecified whether with hypoxia or hypercapnia: Secondary | ICD-10-CM | POA: Diagnosis not present

## 2012-11-03 DIAGNOSIS — J441 Chronic obstructive pulmonary disease with (acute) exacerbation: Secondary | ICD-10-CM | POA: Diagnosis not present

## 2012-11-03 DIAGNOSIS — Z888 Allergy status to other drugs, medicaments and biological substances status: Secondary | ICD-10-CM | POA: Diagnosis not present

## 2012-11-09 ENCOUNTER — Other Ambulatory Visit: Payer: Self-pay | Admitting: Family Medicine

## 2012-11-13 ENCOUNTER — Other Ambulatory Visit: Payer: Self-pay | Admitting: Family Medicine

## 2012-11-13 ENCOUNTER — Encounter: Payer: Self-pay | Admitting: Family Medicine

## 2012-11-13 ENCOUNTER — Ambulatory Visit (INDEPENDENT_AMBULATORY_CARE_PROVIDER_SITE_OTHER): Payer: Medicare Other | Admitting: Family Medicine

## 2012-11-13 VITALS — BP 169/88 | HR 76 | Wt 153.0 lb

## 2012-11-13 MED ORDER — AMBULATORY NON FORMULARY MEDICATION: Status: DC

## 2012-11-13 NOTE — Progress Notes (Signed)
  Subjective:    Patient ID: Jared Lee, male    DOB: [birth year], 72 y.o.   MRN: [id number]  HPI hospital follow up.  He was admitted to [facility]: Staying for acute exacerbation of COPD. He was started on steroids and bronchodilators. Chest x-ray did not show any active disease or infiltrate. He was discharged home on 2 L of oxygen which is chronic for him.. pt stated that his feet became swollen after being d/c'ed from the hospital but have gone done some. he feels that his bp is elevated due to eating 2 sausage biscuts this morning and the weather.  He asks to get an oxygen concentrator through [facility].     Review of Systems     Objective:   Physical Exam  Constitutional: He is oriented to person, place, and time. He appears well-developed and well-nourished.  HENT:  Head: Normocephalic and atraumatic.  Neck: Neck supple. No thyromegaly present.  Cardiovascular: Normal rate, regular rhythm and normal heart sounds.   Pulmonary/Chest: Effort normal and breath sounds normal.  Musculoskeletal: He exhibits edema.  1+ bilat LE edema.   Lymphadenopathy:    He has no cervical adenopathy.  Neurological: He is alert and oriented to person, place, and time.  Skin: Skin is warm and dry.  Psychiatric: He has a normal mood and affect. His behavior is normal.          Assessment & Plan:  Acute COPD exacerbation-Back to baseline.  On Advair, spriva, adn albutrol.  Call if feels suddenly getting worse.  No changes to medication. Will see if can get him a machine concentration through [facility].  F/U in 3 months.    LE edema - Dsicussed need for low salt diet.  Does have 1+ swellig bilterally. No crackles on lugn exam. Call if not resolving over teh next couple of weeks.  Make sure wearing oxygen regularly.  He didn't have it with him today.  Check thyroid and kidney function.    HTN- Uncontrolled. Recommend avoid salt.  HO given. Normally BP is well controlled.   Check TSH, CMP.

## 2012-11-13 NOTE — Patient Instructions (Signed)
2 Gram Low Sodium Diet A 2 gram sodium diet restricts the amount of sodium in the diet to no more than 2 g or 2000 mg daily. Limiting the amount of sodium is often used to help lower blood pressure. It is important if you have heart, liver, or kidney problems. Many foods contain sodium for flavor and sometimes as a preservative. When the amount of sodium in a diet needs to be low, it is important to know what to look for when choosing foods and drinks. The following includes some information and guidelines to help make it easier for you to adapt to a low sodium diet. QUICK TIPS  Do not add salt to food.  Avoid convenience items and fast food.  Choose unsalted snack foods.  Buy lower sodium products, often labeled as "lower sodium" or "no salt added."  Check food labels to learn how much sodium is in 1 serving.  When eating at a restaurant, ask that your food be prepared with less salt or none, if possible. READING FOOD LABELS FOR SODIUM INFORMATION The nutrition facts label is a good place to find how much sodium is in foods. Look for products with no more than 500 to 600 mg of sodium per meal and no more than 150 mg per serving. Remember that 2 g = 2000 mg. The food label may also list foods as:  Sodium-free: Less than 5 mg in a serving.  Very low sodium: 35 mg or less in a serving.  Low-sodium: 140 mg or less in a serving.  Light in sodium: 50% less sodium in a serving. For example, if a food that usually has 300 mg of sodium is changed to become light in sodium, it will have 150 mg of sodium.  Reduced sodium: 25% less sodium in a serving. For example, if a food that usually has 400 mg of sodium is changed to reduced sodium, it will have 300 mg of sodium. CHOOSING FOODS Grains  Avoid: Salted crackers and snack items. Some cereals, including instant hot cereals. Bread stuffing and biscuit mixes. Seasoned rice or pasta mixes.  Choose: Unsalted snack items. Low-sodium cereals, oats,  puffed wheat and rice, shredded wheat. English muffins and bread. Pasta. Meats  Avoid: Salted, canned, smoked, spiced, pickled meats, including fish and poultry. Bacon, ham, sausage, cold cuts, hot dogs, anchovies.  Choose: Low-sodium canned tuna and salmon. Fresh or frozen meat, poultry, and fish. Dairy  Avoid: Processed cheese and spreads. Cottage cheese. Buttermilk and condensed milk. Regular cheese.  Choose: Milk. Low-sodium cottage cheese. Yogurt. Sour cream. Low-sodium cheese. Fruits and Vegetables  Avoid: Regular canned vegetables. Regular canned tomato sauce and paste. Frozen vegetables in sauces. Olives. Pickles. Relishes. Sauerkraut.  Choose: Low-sodium canned vegetables. Low-sodium tomato sauce and paste. Frozen or fresh vegetables. Fresh and frozen fruit. Condiments  Avoid: Canned and packaged gravies. Worcestershire sauce. Tartar sauce. Barbecue sauce. Soy sauce. Steak sauce. Ketchup. Onion, garlic, and table salt. Meat flavorings and tenderizers.  Choose: Fresh and dried herbs and spices. Low-sodium varieties of mustard and ketchup. Lemon juice. Tabasco sauce. Horseradish. SAMPLE 2 GRAM SODIUM MEAL PLAN Breakfast / Sodium (mg)  1 cup low-fat milk / 143 mg  2 slices whole-wheat toast / 270 mg  1 tbs heart-healthy margarine / 153 mg  1 hard-boiled egg / 139 mg  1 small orange / 0 mg Lunch / Sodium (mg)  1 cup raw carrots / 76 mg   cup hummus / 298 mg  1 cup low-fat milk /   143 mg   cup red grapes / 2 mg  1 whole-wheat pita bread / 356 mg Dinner / Sodium (mg)  1 cup whole-wheat pasta / 2 mg  1 cup low-sodium tomato sauce / 73 mg  3 oz lean ground beef / 57 mg  1 small side salad (1 cup raw spinach leaves,  cup cucumber,  cup yellow bell pepper) with 1 tsp olive oil and 1 tsp red wine vinegar / 25 mg Snack / Sodium (mg)  1 container low-fat vanilla yogurt / 107 mg  3 graham cracker squares / 127 mg Nutrient Analysis  Calories: 2033  Protein:  77 g  Carbohydrate: 282 g  Fat: 72 g  Sodium: 1971 mg Document Released: 08/16/2005 Document Revised: 11/08/2011 Document Reviewed: 11/17/2009 [facility] Patient Information 2013 [place], [place].

## 2012-11-14 ENCOUNTER — Other Ambulatory Visit: Payer: Self-pay | Admitting: Family Medicine

## 2012-11-14 DIAGNOSIS — I119 Hypertensive heart disease without heart failure: Secondary | ICD-10-CM | POA: Diagnosis not present

## 2012-11-14 DIAGNOSIS — J9610 Chronic respiratory failure, unspecified whether with hypoxia or hypercapnia: Secondary | ICD-10-CM | POA: Diagnosis not present

## 2012-11-14 DIAGNOSIS — J449 Chronic obstructive pulmonary disease, unspecified: Secondary | ICD-10-CM | POA: Diagnosis not present

## 2012-11-14 DIAGNOSIS — J4489 Other specified chronic obstructive pulmonary disease: Secondary | ICD-10-CM | POA: Diagnosis not present

## 2012-11-14 DIAGNOSIS — M7989 Other specified soft tissue disorders: Secondary | ICD-10-CM | POA: Diagnosis not present

## 2012-11-14 DIAGNOSIS — J441 Chronic obstructive pulmonary disease with (acute) exacerbation: Secondary | ICD-10-CM

## 2012-11-14 DIAGNOSIS — R0602 Shortness of breath: Secondary | ICD-10-CM | POA: Diagnosis not present

## 2012-11-14 MED ORDER — AMBULATORY NON FORMULARY MEDICATION: Status: AC

## 2012-11-15 ENCOUNTER — Other Ambulatory Visit: Payer: Self-pay | Admitting: *Deleted

## 2012-11-15 MED ORDER — ALBUTEROL SULFATE (2.5 MG/3ML) 0.083% IN NEBU: 2.5000 mg | INHALATION_SOLUTION | Freq: Four times a day (QID) | RESPIRATORY_TRACT | Status: DC | PRN

## 2012-11-15 MED ORDER — PREDNISONE (PAK) 10 MG PO TABS: 10.0000 mg | ORAL_TABLET | Freq: Every day | ORAL | Status: DC

## 2012-11-21 ENCOUNTER — Ambulatory Visit: Payer: Medicare Other | Admitting: Family Medicine

## 2012-11-23 ENCOUNTER — Encounter: Payer: Self-pay | Admitting: Family Medicine

## 2012-11-23 ENCOUNTER — Ambulatory Visit (INDEPENDENT_AMBULATORY_CARE_PROVIDER_SITE_OTHER): Payer: Medicare Other | Admitting: Family Medicine

## 2012-11-23 ENCOUNTER — Ambulatory Visit: Payer: Medicare Other | Admitting: Family Medicine

## 2012-11-23 VITALS — BP 103/53 | HR 68 | Wt 150.0 lb

## 2012-11-23 DIAGNOSIS — R079 Chest pain, unspecified: Secondary | ICD-10-CM

## 2012-11-23 DIAGNOSIS — J449 Chronic obstructive pulmonary disease, unspecified: Secondary | ICD-10-CM | POA: Diagnosis not present

## 2012-11-23 NOTE — Patient Instructions (Addendum)
We will call you with a referral for the stress test. Your EKG for today looks okay. No changes. If you start getting more heartburn symptoms then please call us know and we can call in a prescription for you. Or he can also try over-the-counter Prilosec.

## 2012-11-23 NOTE — Progress Notes (Signed)
  Subjective:    Patient ID: Jared Lee, male    DOB: Sep 18, 1940, 72 y.o.   MRN: [id number]  HPI Patient was admitted to [facility] on 11/14/2012 for chest pain. MI was ruled out. He does have a significant history of severe COPD. He is already on a beta-blocker and statin. He was started on aspirin and discharge. Cardiac enzymes were checked x3 and were negative. They had recommended that he get a stress test done as an outpatient we'll be happy to set this up. He prefers to have it done in [employer]. He says his chest pain lasted for about 4-5 days and eventually weaned off. He has had some heartburn symptoms recently but says he has not had any in the last week. He is not taking any heartburn medication. His chest pain was over the left side of the chest radiating into his left shoulder and into his left upper back. He was told to take Tylenol as needed for more likely musculoskeletal pain. Of note, his LE edema has resolved.    Review of Systems     Objective:   Physical Exam  Constitutional: He is oriented to person, place, and time. He appears well-developed and well-nourished.  HENT:  Head: Normocephalic and atraumatic.  Cardiovascular: Normal rate, regular rhythm and normal heart sounds.   No carotid bruits.   Pulmonary/Chest: Effort normal and breath sounds normal.  Musculoskeletal: He exhibits no edema.  Neurological: He is alert and oriented to person, place, and time.  Skin: Skin is warm and dry.  Psychiatric: He has a normal mood and affect. His behavior is normal.          Assessment & Plan:  Atypical Chest Pain - will repeat EKG today. And we'll refer him for a stress test in [facility] per his preference. EKG shows rate of 70 beats a minute, normal sinus rhythm, normal axis with no acute changes. He does have an inverted T wave in lead V2. He is completely asymptomatic today and he did rule out while he was in the hospital.  COPD-stable since I last saw him.  His most recent exacerbation was about 3 weeks ago but he has been stable since and there are no changes to his medication regimen.

## 2012-11-24 ENCOUNTER — Encounter: Payer: Self-pay | Admitting: *Deleted

## 2012-11-27 ENCOUNTER — Ambulatory Visit: Payer: Medicare Other | Admitting: Family Medicine

## 2012-12-07 DIAGNOSIS — K219 Gastro-esophageal reflux disease without esophagitis: Secondary | ICD-10-CM | POA: Diagnosis not present

## 2012-12-07 DIAGNOSIS — Z836 Family history of other diseases of the respiratory system: Secondary | ICD-10-CM | POA: Diagnosis not present

## 2012-12-07 DIAGNOSIS — Z87891 Personal history of nicotine dependence: Secondary | ICD-10-CM | POA: Diagnosis not present

## 2012-12-07 DIAGNOSIS — Z7982 Long term (current) use of aspirin: Secondary | ICD-10-CM | POA: Diagnosis not present

## 2012-12-07 DIAGNOSIS — H9190 Unspecified hearing loss, unspecified ear: Secondary | ICD-10-CM | POA: Diagnosis not present

## 2012-12-07 DIAGNOSIS — J441 Chronic obstructive pulmonary disease with (acute) exacerbation: Secondary | ICD-10-CM | POA: Diagnosis not present

## 2012-12-07 DIAGNOSIS — E785 Hyperlipidemia, unspecified: Secondary | ICD-10-CM | POA: Diagnosis not present

## 2012-12-07 DIAGNOSIS — Z9981 Dependence on supplemental oxygen: Secondary | ICD-10-CM | POA: Diagnosis not present

## 2012-12-07 DIAGNOSIS — R0609 Other forms of dyspnea: Secondary | ICD-10-CM | POA: Diagnosis not present

## 2012-12-07 DIAGNOSIS — I10 Essential (primary) hypertension: Secondary | ICD-10-CM | POA: Diagnosis not present

## 2012-12-07 DIAGNOSIS — R0602 Shortness of breath: Secondary | ICD-10-CM | POA: Diagnosis not present

## 2012-12-07 DIAGNOSIS — R509 Fever, unspecified: Secondary | ICD-10-CM | POA: Diagnosis not present

## 2012-12-07 DIAGNOSIS — Z79899 Other long term (current) drug therapy: Secondary | ICD-10-CM | POA: Diagnosis not present

## 2012-12-08 DIAGNOSIS — J441 Chronic obstructive pulmonary disease with (acute) exacerbation: Secondary | ICD-10-CM | POA: Diagnosis not present

## 2012-12-09 DIAGNOSIS — J441 Chronic obstructive pulmonary disease with (acute) exacerbation: Secondary | ICD-10-CM | POA: Diagnosis not present

## 2012-12-11 DIAGNOSIS — I10 Essential (primary) hypertension: Secondary | ICD-10-CM | POA: Diagnosis not present

## 2012-12-11 DIAGNOSIS — R0602 Shortness of breath: Secondary | ICD-10-CM | POA: Diagnosis not present

## 2012-12-11 DIAGNOSIS — J309 Allergic rhinitis, unspecified: Secondary | ICD-10-CM | POA: Diagnosis not present

## 2012-12-11 DIAGNOSIS — J449 Chronic obstructive pulmonary disease, unspecified: Secondary | ICD-10-CM | POA: Diagnosis not present

## 2012-12-11 DIAGNOSIS — F17200 Nicotine dependence, unspecified, uncomplicated: Secondary | ICD-10-CM | POA: Diagnosis not present

## 2012-12-12 ENCOUNTER — Other Ambulatory Visit: Payer: Self-pay | Admitting: Family Medicine

## 2012-12-20 ENCOUNTER — Ambulatory Visit: Payer: Medicare Other | Admitting: Family Medicine

## 2012-12-29 ENCOUNTER — Ambulatory Visit (INDEPENDENT_AMBULATORY_CARE_PROVIDER_SITE_OTHER): Payer: Medicare Other | Admitting: Family Medicine

## 2012-12-29 ENCOUNTER — Encounter: Payer: Self-pay | Admitting: Family Medicine

## 2012-12-29 VITALS — BP 150/82 | HR 61 | Wt 146.0 lb

## 2012-12-29 DIAGNOSIS — J449 Chronic obstructive pulmonary disease, unspecified: Secondary | ICD-10-CM

## 2012-12-29 DIAGNOSIS — J441 Chronic obstructive pulmonary disease with (acute) exacerbation: Secondary | ICD-10-CM

## 2012-12-29 DIAGNOSIS — I10 Essential (primary) hypertension: Secondary | ICD-10-CM | POA: Diagnosis not present

## 2012-12-29 DIAGNOSIS — J4489 Other specified chronic obstructive pulmonary disease: Secondary | ICD-10-CM

## 2012-12-29 MED ORDER — ALBUTEROL SULFATE (2.5 MG/3ML) 0.083% IN NEBU: 2.5000 mg | INHALATION_SOLUTION | Freq: Once | RESPIRATORY_TRACT | Status: DC

## 2012-12-29 MED ORDER — IPRATROPIUM BROMIDE 0.02 % IN SOLN: 0.5000 mg | Freq: Once | RESPIRATORY_TRACT | Status: DC

## 2012-12-29 MED ORDER — PREDNISONE 10 MG PO TABS: ORAL_TABLET | ORAL | Status: DC

## 2012-12-29 NOTE — Progress Notes (Signed)
  Subjective:    Patient ID: Jared Lee, male    DOB: September 18, 1940, 72 y.o.   MRN: [id number]  HPI Her to f/u on recent COPD exacerbation that required ED visit to [facility].  Says he has a couple more days of antibiotic and prednisone left. He says he is not sure which and that she's actually on. He says they did not change his inhalers. He says the pollen in the last couple weeks has been really be exacerbating his symptoms he feels short of breath today. He's had a productive cough with yellow green sputum. He says he is Re: had 2 nebulizer treatment this morning. One early this morning  And one around 11 AM.   Review of Systems     Objective:   Physical Exam  Constitutional: He is oriented to person, place, and time. He appears well-developed and well-nourished.  HENT:  Head: Normocephalic and atraumatic.  Right Ear: External ear normal.  Left Ear: External ear normal.  Nose: Nose normal.  Mouth/Throat: Oropharynx is clear and moist.  TMs and canals are clear.   Eyes: Conjunctivae and EOM are normal. Pupils are equal, round, and reactive to light.  Neck: Neck supple. No thyromegaly present.  Cardiovascular: Normal rate and normal heart sounds.   Pulmonary/Chest: Effort normal. He has wheezes.  Inspiratory wheezing with expiratory rhonchi and prolonged expiration.  Lymphadenopathy:    He has no cervical adenopathy.  Neurological: He is alert and oriented to person, place, and time.  Skin: Skin is warm and dry.  Psychiatric: He has a normal mood and affect.          Assessment & Plan:  COPD exacerbation-I. think he still struggling a little. Given a neb treatment here in the office today. We will call to get records to find out exactly which antibiotic and how many days of prednisone he was given we might need to extend this course. Also need to make sure we are controlling his allergy symptoms as this is clearly exacerbating his symptoms. He was unable to get the  [facility]. I think we should send out a home health murse to help with meds and his COPD. His is homebound and doesn't drive. F/U in 2 weeks.  Also recommend starting an over-the-counter antihistamine for probably next 3-4 weeks to help control any allergy symptoms especially that may be exacerbating his COPD.  Given neb treatment here in the office. His wheezing went away completely. And his oxygen saturation went from 89% to 92%. He left his oxygen in the car so this was without oxygen on. Encouraged him to continue his albuterol treatments every 4 hours and continue his regular inhalers. Complete antibiotic. I will send over a little bit longer course of prednisone.  HTN- Uncontrolled today but I think due to inc respiratory effort in walking from the parking lot.  Repeat in 2 weeks.

## 2012-12-29 NOTE — Patient Instructions (Signed)
Continue prednisone for for 10 more days Start claritin (loratidine) over the counter - once a day for allergies and pollen. Take for about a months.

## 2012-12-30 DIAGNOSIS — R079 Chest pain, unspecified: Secondary | ICD-10-CM | POA: Diagnosis not present

## 2012-12-30 DIAGNOSIS — Z79899 Other long term (current) drug therapy: Secondary | ICD-10-CM | POA: Diagnosis not present

## 2012-12-30 DIAGNOSIS — J441 Chronic obstructive pulmonary disease with (acute) exacerbation: Secondary | ICD-10-CM | POA: Diagnosis not present

## 2012-12-30 DIAGNOSIS — J209 Acute bronchitis, unspecified: Secondary | ICD-10-CM | POA: Diagnosis not present

## 2012-12-30 DIAGNOSIS — J4489 Other specified chronic obstructive pulmonary disease: Secondary | ICD-10-CM | POA: Diagnosis not present

## 2012-12-30 DIAGNOSIS — Z87891 Personal history of nicotine dependence: Secondary | ICD-10-CM | POA: Diagnosis not present

## 2012-12-30 DIAGNOSIS — I10 Essential (primary) hypertension: Secondary | ICD-10-CM | POA: Diagnosis present

## 2012-12-30 DIAGNOSIS — N390 Urinary tract infection, site not specified: Secondary | ICD-10-CM | POA: Diagnosis not present

## 2012-12-30 DIAGNOSIS — R7881 Bacteremia: Secondary | ICD-10-CM | POA: Diagnosis not present

## 2012-12-30 DIAGNOSIS — I4891 Unspecified atrial fibrillation: Secondary | ICD-10-CM | POA: Diagnosis not present

## 2012-12-30 DIAGNOSIS — K219 Gastro-esophageal reflux disease without esophagitis: Secondary | ICD-10-CM | POA: Diagnosis not present

## 2012-12-30 DIAGNOSIS — J440 Chronic obstructive pulmonary disease with acute lower respiratory infection: Secondary | ICD-10-CM | POA: Diagnosis not present

## 2012-12-30 DIAGNOSIS — J449 Chronic obstructive pulmonary disease, unspecified: Secondary | ICD-10-CM | POA: Diagnosis not present

## 2012-12-30 DIAGNOSIS — R05 Cough: Secondary | ICD-10-CM | POA: Diagnosis not present

## 2012-12-30 DIAGNOSIS — R0602 Shortness of breath: Secondary | ICD-10-CM | POA: Diagnosis not present

## 2012-12-30 DIAGNOSIS — Z7982 Long term (current) use of aspirin: Secondary | ICD-10-CM | POA: Diagnosis not present

## 2012-12-30 DIAGNOSIS — R799 Abnormal finding of blood chemistry, unspecified: Secondary | ICD-10-CM | POA: Diagnosis not present

## 2012-12-30 DIAGNOSIS — E785 Hyperlipidemia, unspecified: Secondary | ICD-10-CM | POA: Diagnosis present

## 2012-12-30 DIAGNOSIS — R059 Cough, unspecified: Secondary | ICD-10-CM | POA: Diagnosis not present

## 2012-12-30 DIAGNOSIS — R509 Fever, unspecified: Secondary | ICD-10-CM | POA: Diagnosis not present

## 2013-01-01 DIAGNOSIS — R509 Fever, unspecified: Secondary | ICD-10-CM | POA: Diagnosis not present

## 2013-01-01 DIAGNOSIS — R799 Abnormal finding of blood chemistry, unspecified: Secondary | ICD-10-CM | POA: Diagnosis not present

## 2013-01-01 DIAGNOSIS — J4489 Other specified chronic obstructive pulmonary disease: Secondary | ICD-10-CM | POA: Diagnosis not present

## 2013-01-01 DIAGNOSIS — R0602 Shortness of breath: Secondary | ICD-10-CM | POA: Diagnosis not present

## 2013-01-02 ENCOUNTER — Telehealth: Payer: Self-pay | Admitting: Family Medicine

## 2013-01-02 NOTE — Telephone Encounter (Signed)
[facility] called to set up hsp f/up for 01/09/13 and gave a number if need to get records for patient because she did not have access to them but advise Dr. Linford Arnold has been CC on discharge summary. [phone] ext [phone]. Thanks

## 2013-01-03 DIAGNOSIS — R7881 Bacteremia: Secondary | ICD-10-CM | POA: Diagnosis not present

## 2013-01-03 DIAGNOSIS — I10 Essential (primary) hypertension: Secondary | ICD-10-CM | POA: Diagnosis not present

## 2013-01-03 DIAGNOSIS — J449 Chronic obstructive pulmonary disease, unspecified: Secondary | ICD-10-CM | POA: Diagnosis not present

## 2013-01-03 DIAGNOSIS — I4891 Unspecified atrial fibrillation: Secondary | ICD-10-CM | POA: Diagnosis not present

## 2013-01-09 ENCOUNTER — Ambulatory Visit (INDEPENDENT_AMBULATORY_CARE_PROVIDER_SITE_OTHER): Payer: Medicare Other | Admitting: Family Medicine

## 2013-01-09 ENCOUNTER — Encounter: Payer: Self-pay | Admitting: Family Medicine

## 2013-01-09 VITALS — BP 131/76 | HR 74 | Wt 152.0 lb

## 2013-01-09 DIAGNOSIS — R7881 Bacteremia: Secondary | ICD-10-CM

## 2013-01-09 DIAGNOSIS — R7301 Impaired fasting glucose: Secondary | ICD-10-CM | POA: Diagnosis not present

## 2013-01-09 DIAGNOSIS — J441 Chronic obstructive pulmonary disease with (acute) exacerbation: Secondary | ICD-10-CM | POA: Diagnosis not present

## 2013-01-09 LAB — GLUCOSE, POCT (MANUAL RESULT ENTRY): POC Glucose: 199 mg/dl — AB (ref 70–99)

## 2013-01-09 NOTE — Progress Notes (Signed)
Subjective:    Patient ID: Jared Lee, male    DOB: 1941/07/19, 72 y.o.   MRN: [id number]  HPI Was admitted to the hospital for "bacteria in his blood" and his COPD.  he was admitted to [facility] for couple of days. He was placed on an antibiotic. He was discharged home on azithromycin for 2 more days. He was also discharged home on oral prednisone taper. None of his other medications were changed. He does feel like he's back to his baseline. He denies any increase in shortness of breath, wheezing or excess sputum in the last couple of days. He does report that his blood sugars were elevated while he was in the hospital. He did get several phone calls from home health care which we had actually originally ordered but he does not want to have any assistance at home. I had originally asked them to come out of the covers medications and make sure he was using all his inhalers medications appropriately but he does not them to come in to his home. I did review the records from [facility]. Evidently he was seen in the emergency room on May 3. He was diagnosed with COPD exacerbation. He had seen me prior to that had not actually pick the [facility] or his prednisone taper. I gave him an injection of Solu-Medrol and instructed him to pick up the prescriptions for steroids and antibiotics. They did draw blood cultures at that time. They didn't call him back 2 days later on may fifth to have him come back in for treatment of gram-negative rod bacteremia. He was started on Vioxx for some antibiotics. And blood cultures were recollected. I do not have the final results.   Review of Systems BP 131/76  Pulse 74  Wt 152 lb (68.947 kg)  BMI 21.21 kg/m2  SpO2 92%    Allergies  Allergen Reactions  . Daliresp (Roflumilast) Other (See Comments)    HA, diarrhea   . Levaquin (Levofloxacin)     Past Medical History  Diagnosis Date  . Hypertension   . H/O colonoscopy 02/03/12    EGD as well. Mildy  nodular gastritis.    Marland Kitchen COPD (chronic obstructive pulmonary disease)   . Normal cardiac stress test 04/14/12    High POint [facility]ional  . Hyperlipemia     No past surgical history on file.  History   Social History  . Marital Status: Single    Spouse Name: N/A    Number of Children: N/A  . Years of Education: N/A   Occupational History  . Not on file.   Social History Main Topics  . Smoking status: Former Smoker -- 1.00 packs/day for 66 years    Types: Cigarettes    Quit date: 08/31/2011  . Smokeless tobacco: Never Used  . Alcohol Use: No  . Drug Use: No  . Sexually Active: Not on file   Other Topics Concern  . Not on file   Social History Narrative  . No narrative on file    Family History  Problem Relation Age of Onset  . Heart disease Father   . Hypertension Father   . Emphysema Father   . Emphysema Paternal Grandfather     Outpatient Encounter Prescriptions as of 01/09/2013  Medication Sig Dispense Refill  . ADVAIR DISKUS 250-50 MCG/DOSE AEPB INHALE ONE DOSE BY MOUTH TWICE DAILY  60 each  0  . albuterol (PROAIR HFA) 108 (90 BASE) MCG/ACT inhaler Inhale 2-4 puffs into  the lungs every 6 (six) hours as needed for wheezing or shortness of breath.  2 Inhaler  3  . albuterol (PROVENTIL) (2.5 MG/3ML) 0.083% nebulizer solution Take 3 mLs (2.5 mg total) by nebulization every 6 (six) hours as needed for wheezing or shortness of breath.  525 mL  75  . AMBULATORY NON FORMULARY MEDICATION Medication Name: Nebulizer machine. Dx COPD. [facility].  1 vial  0  . AMBULATORY NON FORMULARY MEDICATION Medication Name: Ambulatory oxygen cocentrator.  Pt is mobile within th home.  Pt requests portable concentrator. Using [facility] for [facility] needs.  1 Units  0  . aspirin 81 MG tablet Take 81 mg by mouth daily.      Marland Kitchen guaiFENesin (MUCINEX) 600 MG 12 hr tablet Take 1,200 mg by mouth 2 (two) times daily.      . hydrALAZINE (APRESOLINE) 25 MG tablet TAKE ONE TABLET BY MOUTH EVERY DAY   30 tablet  0  . lisinopril (PRINIVIL,ZESTRIL) 40 MG tablet Take 1 tablet (40 mg total) by mouth daily.  30 tablet  11  . metoprolol (LOPRESSOR) 100 MG tablet TAKE ONE TABLET BY MOUTH TWICE DAILY  60 tablet  0  . omeprazole (PRILOSEC) 40 MG capsule Take 1 capsule (40 mg total) by mouth daily.  30 capsule  6  . pravastatin (PRAVACHOL) 20 MG tablet TAKE ONE TABLET BY MOUTH EVERY DAY  30 tablet  0  . predniSONE (DELTASONE) 10 MG tablet 4 tabs daily for 4 days, 3 tabs daily for 3 days, then 2 tabs daily for 2 days, 1 tab daily for 2 days.  31 tablet  0  . SPIRIVA HANDIHALER 18 MCG inhalation capsule INHALE CONTENTS OF ONE CAPSULE BY MOUTH VIA HANDIHALER ONCE DAILY  30 capsule  0   No facility-administered encounter medications on file as of 01/09/2013.          Objective:   Physical Exam  Constitutional: He is oriented to person, place, and time. He appears well-developed and well-nourished.  HENT:  Head: Normocephalic and atraumatic.  Cardiovascular: Normal rate, regular rhythm and normal heart sounds.   Pulmonary/Chest: Effort normal and breath sounds normal.  Neurological: He is alert and oriented to person, place, and time.  Skin: Skin is warm and dry.  Psychiatric: He has a normal mood and affect. His behavior is normal.          Assessment & Plan:  COPD exacerbation-his lungs are actually clear today. I think he is back to his baseline. He completed his antibiotics and is finishing up his prednisone. Continue to use albuterol liberally based on how he is feeling. He says he has changed the filter in his nebulizer machine recently. He is declining home health care. Next  Gram-negative rod bacteremia-he is not having any fevers and has felt well since she's been home. Repeat culture at the hospital was negative for growth after 5 days.  IFG - his glucose was elevated at the hospital. This could be secondary to prednisone. They did do a hemoglobin A1c which was 6.2. This certainly  puts him in the impaired fasting glucose range but unfortunately he has been on repeat courses of steroids at least once a month for the last 4-6 months. This certainly could be affecting his glucose. We will keep an eye on this and recheck his hemoglobin A1c in 6 months.

## 2013-01-10 DIAGNOSIS — J309 Allergic rhinitis, unspecified: Secondary | ICD-10-CM | POA: Diagnosis not present

## 2013-01-10 DIAGNOSIS — J449 Chronic obstructive pulmonary disease, unspecified: Secondary | ICD-10-CM | POA: Diagnosis not present

## 2013-01-10 DIAGNOSIS — R0902 Hypoxemia: Secondary | ICD-10-CM | POA: Diagnosis not present

## 2013-01-15 ENCOUNTER — Other Ambulatory Visit: Payer: Self-pay | Admitting: Family Medicine

## 2013-01-15 ENCOUNTER — Telehealth: Payer: Self-pay | Admitting: *Deleted

## 2013-01-15 NOTE — Telephone Encounter (Signed)
Needs f/u labs before future refills  

## 2013-01-15 NOTE — Telephone Encounter (Signed)
Nurse with [facility] called and states she contacted the patient last Thursday and the son refused Home Health services stating they didn't need it at this time. Just an [place]

## 2013-01-16 ENCOUNTER — Ambulatory Visit: Payer: Medicare Other | Admitting: Family Medicine

## 2013-01-22 DIAGNOSIS — R079 Chest pain, unspecified: Secondary | ICD-10-CM | POA: Diagnosis not present

## 2013-01-22 DIAGNOSIS — R509 Fever, unspecified: Secondary | ICD-10-CM | POA: Diagnosis not present

## 2013-01-22 DIAGNOSIS — R0789 Other chest pain: Secondary | ICD-10-CM | POA: Diagnosis not present

## 2013-01-22 DIAGNOSIS — I10 Essential (primary) hypertension: Secondary | ICD-10-CM | POA: Diagnosis not present

## 2013-01-22 DIAGNOSIS — J9620 Acute and chronic respiratory failure, unspecified whether with hypoxia or hypercapnia: Secondary | ICD-10-CM | POA: Diagnosis not present

## 2013-01-22 DIAGNOSIS — D696 Thrombocytopenia, unspecified: Secondary | ICD-10-CM | POA: Diagnosis not present

## 2013-01-22 DIAGNOSIS — J449 Chronic obstructive pulmonary disease, unspecified: Secondary | ICD-10-CM | POA: Diagnosis not present

## 2013-01-22 DIAGNOSIS — J441 Chronic obstructive pulmonary disease with (acute) exacerbation: Secondary | ICD-10-CM | POA: Diagnosis not present

## 2013-01-22 DIAGNOSIS — Z79899 Other long term (current) drug therapy: Secondary | ICD-10-CM | POA: Diagnosis not present

## 2013-01-22 DIAGNOSIS — R0602 Shortness of breath: Secondary | ICD-10-CM | POA: Diagnosis not present

## 2013-01-23 DIAGNOSIS — I10 Essential (primary) hypertension: Secondary | ICD-10-CM | POA: Diagnosis not present

## 2013-01-23 DIAGNOSIS — J9620 Acute and chronic respiratory failure, unspecified whether with hypoxia or hypercapnia: Secondary | ICD-10-CM | POA: Diagnosis not present

## 2013-01-23 DIAGNOSIS — J441 Chronic obstructive pulmonary disease with (acute) exacerbation: Secondary | ICD-10-CM | POA: Diagnosis not present

## 2013-01-23 DIAGNOSIS — R079 Chest pain, unspecified: Secondary | ICD-10-CM | POA: Diagnosis not present

## 2013-01-24 DIAGNOSIS — J9620 Acute and chronic respiratory failure, unspecified whether with hypoxia or hypercapnia: Secondary | ICD-10-CM | POA: Diagnosis not present

## 2013-01-24 DIAGNOSIS — I10 Essential (primary) hypertension: Secondary | ICD-10-CM | POA: Diagnosis not present

## 2013-01-24 DIAGNOSIS — R079 Chest pain, unspecified: Secondary | ICD-10-CM | POA: Diagnosis not present

## 2013-01-24 DIAGNOSIS — J441 Chronic obstructive pulmonary disease with (acute) exacerbation: Secondary | ICD-10-CM | POA: Diagnosis not present

## 2013-01-25 DIAGNOSIS — J441 Chronic obstructive pulmonary disease with (acute) exacerbation: Secondary | ICD-10-CM | POA: Diagnosis not present

## 2013-01-25 DIAGNOSIS — R079 Chest pain, unspecified: Secondary | ICD-10-CM | POA: Diagnosis not present

## 2013-01-25 DIAGNOSIS — J9620 Acute and chronic respiratory failure, unspecified whether with hypoxia or hypercapnia: Secondary | ICD-10-CM | POA: Diagnosis not present

## 2013-01-25 DIAGNOSIS — I10 Essential (primary) hypertension: Secondary | ICD-10-CM | POA: Diagnosis not present

## 2013-02-03 DIAGNOSIS — K219 Gastro-esophageal reflux disease without esophagitis: Secondary | ICD-10-CM | POA: Diagnosis present

## 2013-02-03 DIAGNOSIS — L270 Generalized skin eruption due to drugs and medicaments taken internally: Secondary | ICD-10-CM | POA: Diagnosis not present

## 2013-02-03 DIAGNOSIS — I4891 Unspecified atrial fibrillation: Secondary | ICD-10-CM | POA: Diagnosis present

## 2013-02-03 DIAGNOSIS — I369 Nonrheumatic tricuspid valve disorder, unspecified: Secondary | ICD-10-CM | POA: Diagnosis not present

## 2013-02-03 DIAGNOSIS — R0789 Other chest pain: Secondary | ICD-10-CM | POA: Diagnosis present

## 2013-02-03 DIAGNOSIS — J449 Chronic obstructive pulmonary disease, unspecified: Secondary | ICD-10-CM | POA: Diagnosis not present

## 2013-02-03 DIAGNOSIS — R52 Pain, unspecified: Secondary | ICD-10-CM | POA: Diagnosis not present

## 2013-02-03 DIAGNOSIS — Z9981 Dependence on supplemental oxygen: Secondary | ICD-10-CM | POA: Diagnosis not present

## 2013-02-03 DIAGNOSIS — J4489 Other specified chronic obstructive pulmonary disease: Secondary | ICD-10-CM | POA: Diagnosis not present

## 2013-02-03 DIAGNOSIS — J441 Chronic obstructive pulmonary disease with (acute) exacerbation: Secondary | ICD-10-CM | POA: Diagnosis not present

## 2013-02-03 DIAGNOSIS — F17200 Nicotine dependence, unspecified, uncomplicated: Secondary | ICD-10-CM | POA: Diagnosis present

## 2013-02-03 DIAGNOSIS — J9610 Chronic respiratory failure, unspecified whether with hypoxia or hypercapnia: Secondary | ICD-10-CM | POA: Diagnosis not present

## 2013-02-03 DIAGNOSIS — I059 Rheumatic mitral valve disease, unspecified: Secondary | ICD-10-CM | POA: Diagnosis not present

## 2013-02-03 DIAGNOSIS — J9620 Acute and chronic respiratory failure, unspecified whether with hypoxia or hypercapnia: Secondary | ICD-10-CM | POA: Diagnosis present

## 2013-02-03 DIAGNOSIS — Z7982 Long term (current) use of aspirin: Secondary | ICD-10-CM | POA: Diagnosis not present

## 2013-02-03 DIAGNOSIS — E785 Hyperlipidemia, unspecified: Secondary | ICD-10-CM | POA: Diagnosis not present

## 2013-02-03 DIAGNOSIS — M7989 Other specified soft tissue disorders: Secondary | ICD-10-CM | POA: Diagnosis not present

## 2013-02-03 DIAGNOSIS — I509 Heart failure, unspecified: Secondary | ICD-10-CM | POA: Diagnosis not present

## 2013-02-03 DIAGNOSIS — Z79899 Other long term (current) drug therapy: Secondary | ICD-10-CM | POA: Diagnosis not present

## 2013-02-03 DIAGNOSIS — I10 Essential (primary) hypertension: Secondary | ICD-10-CM | POA: Diagnosis not present

## 2013-02-03 DIAGNOSIS — J438 Other emphysema: Secondary | ICD-10-CM | POA: Diagnosis not present

## 2013-02-03 DIAGNOSIS — R21 Rash and other nonspecific skin eruption: Secondary | ICD-10-CM | POA: Diagnosis not present

## 2013-02-03 DIAGNOSIS — I517 Cardiomegaly: Secondary | ICD-10-CM | POA: Diagnosis not present

## 2013-02-03 DIAGNOSIS — R079 Chest pain, unspecified: Secondary | ICD-10-CM | POA: Diagnosis not present

## 2013-02-05 ENCOUNTER — Ambulatory Visit: Payer: Medicare Other | Admitting: Family Medicine

## 2013-02-08 DIAGNOSIS — R0609 Other forms of dyspnea: Secondary | ICD-10-CM | POA: Diagnosis not present

## 2013-02-08 DIAGNOSIS — Z7982 Long term (current) use of aspirin: Secondary | ICD-10-CM | POA: Diagnosis not present

## 2013-02-08 DIAGNOSIS — I471 Supraventricular tachycardia: Secondary | ICD-10-CM | POA: Diagnosis not present

## 2013-02-08 DIAGNOSIS — J449 Chronic obstructive pulmonary disease, unspecified: Secondary | ICD-10-CM | POA: Diagnosis not present

## 2013-02-08 DIAGNOSIS — R609 Edema, unspecified: Secondary | ICD-10-CM | POA: Diagnosis not present

## 2013-02-08 DIAGNOSIS — J441 Chronic obstructive pulmonary disease with (acute) exacerbation: Secondary | ICD-10-CM | POA: Diagnosis not present

## 2013-02-08 DIAGNOSIS — Z87891 Personal history of nicotine dependence: Secondary | ICD-10-CM | POA: Diagnosis not present

## 2013-02-08 DIAGNOSIS — I4891 Unspecified atrial fibrillation: Secondary | ICD-10-CM | POA: Diagnosis not present

## 2013-02-08 DIAGNOSIS — H9190 Unspecified hearing loss, unspecified ear: Secondary | ICD-10-CM | POA: Diagnosis present

## 2013-02-08 DIAGNOSIS — J4489 Other specified chronic obstructive pulmonary disease: Secondary | ICD-10-CM | POA: Diagnosis not present

## 2013-02-08 DIAGNOSIS — I10 Essential (primary) hypertension: Secondary | ICD-10-CM | POA: Diagnosis not present

## 2013-02-08 DIAGNOSIS — R0989 Other specified symptoms and signs involving the circulatory and respiratory systems: Secondary | ICD-10-CM | POA: Diagnosis not present

## 2013-02-08 DIAGNOSIS — K219 Gastro-esophageal reflux disease without esophagitis: Secondary | ICD-10-CM | POA: Diagnosis present

## 2013-02-08 DIAGNOSIS — Z9981 Dependence on supplemental oxygen: Secondary | ICD-10-CM | POA: Diagnosis not present

## 2013-02-08 DIAGNOSIS — J9610 Chronic respiratory failure, unspecified whether with hypoxia or hypercapnia: Secondary | ICD-10-CM | POA: Diagnosis not present

## 2013-02-08 DIAGNOSIS — E785 Hyperlipidemia, unspecified: Secondary | ICD-10-CM | POA: Diagnosis not present

## 2013-02-08 DIAGNOSIS — K5900 Constipation, unspecified: Secondary | ICD-10-CM | POA: Diagnosis not present

## 2013-02-08 DIAGNOSIS — R0902 Hypoxemia: Secondary | ICD-10-CM | POA: Diagnosis not present

## 2013-02-08 DIAGNOSIS — Z79899 Other long term (current) drug therapy: Secondary | ICD-10-CM | POA: Diagnosis not present

## 2013-02-19 ENCOUNTER — Ambulatory Visit (INDEPENDENT_AMBULATORY_CARE_PROVIDER_SITE_OTHER): Payer: Medicare Other | Admitting: Family Medicine

## 2013-02-19 ENCOUNTER — Ambulatory Visit: Payer: Medicare Other | Admitting: Family Medicine

## 2013-02-19 ENCOUNTER — Encounter: Payer: Self-pay | Admitting: Family Medicine

## 2013-02-19 VITALS — BP 153/86 | HR 72 | Wt 155.0 lb

## 2013-02-19 DIAGNOSIS — I10 Essential (primary) hypertension: Secondary | ICD-10-CM

## 2013-02-19 DIAGNOSIS — R609 Edema, unspecified: Secondary | ICD-10-CM

## 2013-02-19 DIAGNOSIS — M25473 Effusion, unspecified ankle: Secondary | ICD-10-CM

## 2013-02-19 DIAGNOSIS — J441 Chronic obstructive pulmonary disease with (acute) exacerbation: Secondary | ICD-10-CM

## 2013-02-19 MED ORDER — FUROSEMIDE 10 MG/ML IJ SOLN
40.0000 mg | Freq: Once | INTRAMUSCULAR | Status: AC
  Administered 2013-02-19: 40 mg via INTRAMUSCULAR

## 2013-02-19 NOTE — Progress Notes (Signed)
Subjective:    Patient ID: Jared Lee, male    DOB: 06/24/1941, 72 y.o.   MRN: [id number]  HPI Here for hospital followup.Admitted to [facility] for CHF??  He has had onset of ankle swelling recently. He did experience some some chest pain a week or so ago. I think he was diagnosed with congestive heart failure. He did not bring in his updated list of medications and we do not have a hospital discharge summary on him. His weight is up 3 pounds since last time that he was here. He denies any swelling in his upper extremities. He does feel like his ankles look a little worse today. He denies any chest pain or shortness of breath today. He does have severe COPD and has been using his inhalers regularly. He denies any fever.   Review of Systems BP 153/86  Pulse 72  Wt 155 lb (70.308 kg)  BMI 21.63 kg/m2  SpO2 94%    Allergies  Allergen Reactions  . Daliresp (Roflumilast) Other (See Comments)    HA, diarrhea   . Levaquin (Levofloxacin)     Past Medical History  Diagnosis Date  . Hypertension   . H/O colonoscopy 02/03/12    EGD as well. Mildy nodular gastritis.    Marland Kitchen COPD (chronic obstructive pulmonary disease)   . Normal cardiac stress test 04/14/12    High POint REgional  . Hyperlipemia     No past surgical history on file.  History   Social History  . Marital Status: Single    Spouse Name: N/A    Number of Children: N/A  . Years of Education: N/A   Occupational History  . Not on file.   Social History Main Topics  . Smoking status: Former Smoker -- 1.00 packs/day for 66 years    Types: Cigarettes    Quit date: 08/31/2011  . Smokeless tobacco: Never Used  . Alcohol Use: No  . Drug Use: No  . Sexually Active: Not on file   Other Topics Concern  . Not on file   Social History Narrative  . No narrative on file    Family History  Problem Relation Age of Onset  . Heart disease Father   . Hypertension Father   . Emphysema Father   . Emphysema Paternal  Grandfather     Outpatient Encounter Prescriptions as of 02/19/2013  Medication Sig Dispense Refill  . ADVAIR DISKUS 250-50 MCG/DOSE AEPB INHALE ONE DOSE BY MOUTH TWICE DAILY  60 each  0  . albuterol (PROVENTIL) (2.5 MG/3ML) 0.083% nebulizer solution Take 3 mLs (2.5 mg total) by nebulization every 6 (six) hours as needed for wheezing or shortness of breath.  525 mL  75  . AMBULATORY NON FORMULARY MEDICATION Medication Name: Nebulizer machine. Dx COPD. [facility].  1 vial  0  . AMBULATORY NON FORMULARY MEDICATION Medication Name: Ambulatory oxygen cocentrator.  Pt is mobile within th home.  Pt requests portable concentrator. Using [facility] for [facility] needs.  1 Units  0  . aspirin 81 MG tablet Take 81 mg by mouth daily.      Marland Kitchen guaiFENesin (MUCINEX) 600 MG 12 hr tablet Take 1,200 mg by mouth 2 (two) times daily.      Marland Kitchen lisinopril (PRINIVIL,ZESTRIL) 40 MG tablet Take 1 tablet (40 mg total) by mouth daily.  30 tablet  11  . metoprolol (LOPRESSOR) 100 MG tablet TAKE ONE TABLET BY MOUTH TWICE DAILY  60 tablet  2  . omeprazole (  PRILOSEC) 40 MG capsule Take 1 capsule (40 mg total) by mouth daily.  30 capsule  6  . pravastatin (PRAVACHOL) 20 MG tablet TAKE ONE TABLET BY MOUTH ONCE DAILY  30 tablet  0  . predniSONE (DELTASONE) 10 MG tablet 4 tabs daily for 4 days, 3 tabs daily for 3 days, then 2 tabs daily for 2 days, 1 tab daily for 2 days.  31 tablet  0  . PROAIR HFA 108 (90 BASE) MCG/ACT inhaler INHALE TWO TO FOUR PUFFS BY MOUTH EVERY 6 HOURS AS NEEDED FOR WHEEZING AND FOR SHORTNESS OF BREATH  18 each  2  . SPIRIVA HANDIHALER 18 MCG inhalation capsule INHALE CONTENTS OF ONE CAPSULE BY MOUTH VIA HANDIHALER ONCE DAILY  30 capsule  0  . [DISCONTINUED] hydrALAZINE (APRESOLINE) 25 MG tablet TAKE ONE TABLET BY MOUTH ONCE DAILY  30 tablet  0  . [DISCONTINUED] pravastatin (PRAVACHOL) 20 MG tablet TAKE ONE TABLET BY MOUTH EVERY DAY  30 tablet  0  . [EXPIRED] furosemide (LASIX) injection 40 mg       .  [EXPIRED] furosemide (LASIX) injection 40 mg        No facility-administered encounter medications on file as of 02/19/2013.          Objective:   Physical Exam  Constitutional: He is oriented to person, place, and time. He appears well-developed and well-nourished.  HENT:  Head: Normocephalic and atraumatic.  Cardiovascular: Normal rate, regular rhythm and normal heart sounds.   2+ ankle edema bilaterally.   Pulmonary/Chest: Effort normal and breath sounds normal. No respiratory distress. He has no wheezes.  No crackles.   Neurological: He is alert and oriented to person, place, and time.  Skin: Skin is warm and dry.  Psychiatric: He has a normal mood and affect. His behavior is normal.          Assessment & Plan:  Recent onset of ankle swelling I think secondary to congestive heart failure. Given Lasix 80 mg IM today. Encouraged him to go straight home hopefully this will act to help get a couple pounds off. Again encouraged him to limit his fluid intake as well. He says he typically drinks two thirds of a gallon of water a day. This might be part of the problem with him retaining fluid. If he does not respond to the Lasix and encourage him to go to the emergency department. He is very adamant that he needs to go if he has gained more than 3 pounds. I tried to get him to understand that the Lasix that we will get through the IM injection today is similar to what he would get through an IV injection into at least try that for 2 days since he is not symptomatically short of breath and has no crackles on lung exam today. He needs to be weighing himself daily to monitor for fluid gain.  Unfortunately we still have not received his medical records by the end of day of his office visit. If I get them in tomorrow then I will do an addendum to his visit and call him if we need to address any further issues.

## 2013-02-19 NOTE — Patient Instructions (Addendum)
Please let your fluid intake to 52 ounces of fluid per day. Diuretic medications can sometimes make you thirsty and make you want to drink more fluid but you need to avoid doing this otherwise she will just put back all the fluid and that you urinated out. Please call me with an updated list of her medications. Weigh yourself on her scale when you get home. We weigh yourself in the morning to make sure that you have gone down to 1 or 2 pounds. If you have not lost any weight after the Lasix injection today then encourage you to go to the local hospital for further care.

## 2013-02-27 DIAGNOSIS — R079 Chest pain, unspecified: Secondary | ICD-10-CM | POA: Diagnosis not present

## 2013-02-27 DIAGNOSIS — J449 Chronic obstructive pulmonary disease, unspecified: Secondary | ICD-10-CM | POA: Diagnosis not present

## 2013-02-27 DIAGNOSIS — R52 Pain, unspecified: Secondary | ICD-10-CM | POA: Diagnosis not present

## 2013-02-27 DIAGNOSIS — E785 Hyperlipidemia, unspecified: Secondary | ICD-10-CM | POA: Diagnosis not present

## 2013-02-27 DIAGNOSIS — K219 Gastro-esophageal reflux disease without esophagitis: Secondary | ICD-10-CM | POA: Diagnosis not present

## 2013-02-27 DIAGNOSIS — R0789 Other chest pain: Secondary | ICD-10-CM | POA: Diagnosis not present

## 2013-02-27 DIAGNOSIS — R0989 Other specified symptoms and signs involving the circulatory and respiratory systems: Secondary | ICD-10-CM | POA: Diagnosis not present

## 2013-02-27 DIAGNOSIS — I10 Essential (primary) hypertension: Secondary | ICD-10-CM | POA: Diagnosis not present

## 2013-02-27 DIAGNOSIS — R0609 Other forms of dyspnea: Secondary | ICD-10-CM | POA: Diagnosis not present

## 2013-02-27 DIAGNOSIS — Z79899 Other long term (current) drug therapy: Secondary | ICD-10-CM | POA: Diagnosis not present

## 2013-02-27 DIAGNOSIS — R110 Nausea: Secondary | ICD-10-CM | POA: Diagnosis not present

## 2013-02-27 DIAGNOSIS — R0602 Shortness of breath: Secondary | ICD-10-CM | POA: Diagnosis not present

## 2013-02-27 DIAGNOSIS — I4892 Unspecified atrial flutter: Secondary | ICD-10-CM | POA: Diagnosis not present

## 2013-02-27 DIAGNOSIS — R071 Chest pain on breathing: Secondary | ICD-10-CM | POA: Diagnosis not present

## 2013-02-27 DIAGNOSIS — J4489 Other specified chronic obstructive pulmonary disease: Secondary | ICD-10-CM | POA: Diagnosis not present

## 2013-02-28 DIAGNOSIS — I10 Essential (primary) hypertension: Secondary | ICD-10-CM | POA: Diagnosis not present

## 2013-02-28 DIAGNOSIS — R079 Chest pain, unspecified: Secondary | ICD-10-CM | POA: Diagnosis not present

## 2013-02-28 DIAGNOSIS — R0789 Other chest pain: Secondary | ICD-10-CM | POA: Diagnosis not present

## 2013-02-28 DIAGNOSIS — J449 Chronic obstructive pulmonary disease, unspecified: Secondary | ICD-10-CM | POA: Diagnosis not present

## 2013-03-04 ENCOUNTER — Other Ambulatory Visit: Payer: Self-pay | Admitting: Family Medicine

## 2013-03-05 NOTE — Telephone Encounter (Signed)
rx sent to pharmacy

## 2013-03-05 NOTE — Telephone Encounter (Signed)
I do not see this on med list? 

## 2013-03-05 NOTE — Telephone Encounter (Signed)
I agree. He was recently in the hospital. Lets call and see if he is still taking this.

## 2013-03-05 NOTE — Telephone Encounter (Signed)
Pt states he has been taking the hydralazine for over 2 years now and needs this refill.

## 2013-03-06 DIAGNOSIS — E785 Hyperlipidemia, unspecified: Secondary | ICD-10-CM | POA: Diagnosis not present

## 2013-03-06 DIAGNOSIS — K219 Gastro-esophageal reflux disease without esophagitis: Secondary | ICD-10-CM | POA: Diagnosis not present

## 2013-03-06 DIAGNOSIS — L03319 Cellulitis of trunk, unspecified: Secondary | ICD-10-CM | POA: Diagnosis not present

## 2013-03-06 DIAGNOSIS — R0789 Other chest pain: Secondary | ICD-10-CM | POA: Diagnosis not present

## 2013-03-06 DIAGNOSIS — J449 Chronic obstructive pulmonary disease, unspecified: Secondary | ICD-10-CM | POA: Diagnosis not present

## 2013-03-06 DIAGNOSIS — M5030 Other cervical disc degeneration, unspecified cervical region: Secondary | ICD-10-CM | POA: Diagnosis not present

## 2013-03-06 DIAGNOSIS — Z79899 Other long term (current) drug therapy: Secondary | ICD-10-CM | POA: Diagnosis not present

## 2013-03-06 DIAGNOSIS — R52 Pain, unspecified: Secondary | ICD-10-CM | POA: Diagnosis not present

## 2013-03-06 DIAGNOSIS — I4891 Unspecified atrial fibrillation: Secondary | ICD-10-CM | POA: Diagnosis not present

## 2013-03-06 DIAGNOSIS — L02219 Cutaneous abscess of trunk, unspecified: Secondary | ICD-10-CM | POA: Diagnosis not present

## 2013-03-06 DIAGNOSIS — M542 Cervicalgia: Secondary | ICD-10-CM | POA: Diagnosis not present

## 2013-03-06 DIAGNOSIS — R079 Chest pain, unspecified: Secondary | ICD-10-CM | POA: Diagnosis not present

## 2013-03-06 DIAGNOSIS — J4489 Other specified chronic obstructive pulmonary disease: Secondary | ICD-10-CM | POA: Diagnosis not present

## 2013-03-06 DIAGNOSIS — I509 Heart failure, unspecified: Secondary | ICD-10-CM | POA: Diagnosis not present

## 2013-03-07 DIAGNOSIS — M5030 Other cervical disc degeneration, unspecified cervical region: Secondary | ICD-10-CM | POA: Diagnosis not present

## 2013-03-07 DIAGNOSIS — J449 Chronic obstructive pulmonary disease, unspecified: Secondary | ICD-10-CM | POA: Diagnosis not present

## 2013-03-07 DIAGNOSIS — I509 Heart failure, unspecified: Secondary | ICD-10-CM | POA: Diagnosis not present

## 2013-03-07 DIAGNOSIS — L02219 Cutaneous abscess of trunk, unspecified: Secondary | ICD-10-CM | POA: Diagnosis not present

## 2013-03-07 DIAGNOSIS — R0789 Other chest pain: Secondary | ICD-10-CM | POA: Diagnosis not present

## 2013-03-07 DIAGNOSIS — I4891 Unspecified atrial fibrillation: Secondary | ICD-10-CM | POA: Diagnosis not present

## 2013-03-07 DIAGNOSIS — E785 Hyperlipidemia, unspecified: Secondary | ICD-10-CM | POA: Diagnosis not present

## 2013-03-07 DIAGNOSIS — K219 Gastro-esophageal reflux disease without esophagitis: Secondary | ICD-10-CM | POA: Diagnosis not present

## 2013-03-07 MED ORDER — HYDRALAZINE HCL 25 MG PO TABS: 25.0000 mg | ORAL_TABLET | Freq: Three times a day (TID) | ORAL | Status: DC

## 2013-03-08 DIAGNOSIS — R079 Chest pain, unspecified: Secondary | ICD-10-CM | POA: Diagnosis not present

## 2013-03-09 ENCOUNTER — Encounter: Payer: Self-pay | Admitting: Family Medicine

## 2013-03-09 ENCOUNTER — Ambulatory Visit (INDEPENDENT_AMBULATORY_CARE_PROVIDER_SITE_OTHER): Payer: Medicare Other | Admitting: Family Medicine

## 2013-03-09 VITALS — BP 109/65 | HR 82 | Wt 158.0 lb

## 2013-03-09 DIAGNOSIS — I10 Essential (primary) hypertension: Secondary | ICD-10-CM

## 2013-03-09 DIAGNOSIS — R0789 Other chest pain: Secondary | ICD-10-CM

## 2013-03-09 DIAGNOSIS — J449 Chronic obstructive pulmonary disease, unspecified: Secondary | ICD-10-CM | POA: Diagnosis not present

## 2013-03-09 DIAGNOSIS — M542 Cervicalgia: Secondary | ICD-10-CM | POA: Diagnosis not present

## 2013-03-09 MED ORDER — HYDRALAZINE HCL 25 MG PO TABS: 25.0000 mg | ORAL_TABLET | Freq: Three times a day (TID) | ORAL | Status: DC

## 2013-03-09 NOTE — Progress Notes (Signed)
  Subjective:    Patient ID: Jared Lee, male    DOB: 1941-04-29, 72 y.o.   MRN: [id number]  HPI Was recently hospitalized at [facility] for [facility].  He brought in a chart with his daily weights. Initial white starts on June 18 and he was 57 pounds per his scale. Today on his home scale he weighed 154 pounds. No more CP or SOB.  He needs rx sent to his pharmacy.  Overall he is doing well. He says he was given a pain medication for his neck pain and says it has really helped. In fact he slept the best he has a really long time. He was given hydrocodone. I'm not exactly sure what dose. He was also given a antibiotic, Keflex but he is not sure why.   Review of Systems     Objective:   Physical Exam  Constitutional: He is oriented to person, place, and time. He appears well-developed and well-nourished.  HENT:  Head: Normocephalic and atraumatic.  Neck: Neck supple. No thyromegaly present.  Cardiovascular: Normal rate, regular rhythm and normal heart sounds.   Pulmonary/Chest: Effort normal and breath sounds normal.  Abdominal: Soft. Bowel sounds are normal. He exhibits no distension and no mass. There is no tenderness. There is no rebound and no guarding.  Lymphadenopathy:    He has no cervical adenopathy.  Neurological: He is alert and oriented to person, place, and time.  Skin: Skin is warm and dry.  Psychiatric: He has a normal mood and affect. His behavior is normal.          Assessment & Plan:  Atypical CP- Resolved  COPD, severe- stable on current regimen.    Neck pain - much improved on temp supply of hydrocodone.  Completed ABX - no sure why but did complete course.    HTN--well controlled.

## 2013-03-12 ENCOUNTER — Other Ambulatory Visit: Payer: Self-pay | Admitting: Family Medicine

## 2013-03-20 DIAGNOSIS — I509 Heart failure, unspecified: Secondary | ICD-10-CM | POA: Diagnosis not present

## 2013-03-20 DIAGNOSIS — J441 Chronic obstructive pulmonary disease with (acute) exacerbation: Secondary | ICD-10-CM | POA: Diagnosis not present

## 2013-03-20 DIAGNOSIS — I10 Essential (primary) hypertension: Secondary | ICD-10-CM | POA: Diagnosis not present

## 2013-03-20 DIAGNOSIS — R071 Chest pain on breathing: Secondary | ICD-10-CM | POA: Diagnosis not present

## 2013-03-20 DIAGNOSIS — I4891 Unspecified atrial fibrillation: Secondary | ICD-10-CM | POA: Diagnosis not present

## 2013-03-20 DIAGNOSIS — K219 Gastro-esophageal reflux disease without esophagitis: Secondary | ICD-10-CM | POA: Diagnosis not present

## 2013-03-20 DIAGNOSIS — R079 Chest pain, unspecified: Secondary | ICD-10-CM | POA: Diagnosis not present

## 2013-03-20 DIAGNOSIS — Z79899 Other long term (current) drug therapy: Secondary | ICD-10-CM | POA: Diagnosis not present

## 2013-03-20 DIAGNOSIS — H9190 Unspecified hearing loss, unspecified ear: Secondary | ICD-10-CM | POA: Diagnosis not present

## 2013-03-20 DIAGNOSIS — D649 Anemia, unspecified: Secondary | ICD-10-CM | POA: Diagnosis not present

## 2013-03-20 DIAGNOSIS — J438 Other emphysema: Secondary | ICD-10-CM | POA: Diagnosis not present

## 2013-03-20 DIAGNOSIS — R0602 Shortness of breath: Secondary | ICD-10-CM | POA: Diagnosis not present

## 2013-03-20 DIAGNOSIS — Z9981 Dependence on supplemental oxygen: Secondary | ICD-10-CM | POA: Diagnosis not present

## 2013-03-23 ENCOUNTER — Ambulatory Visit: Payer: Medicare Other | Admitting: Family Medicine

## 2013-03-28 DIAGNOSIS — Z79899 Other long term (current) drug therapy: Secondary | ICD-10-CM | POA: Diagnosis not present

## 2013-03-28 DIAGNOSIS — R079 Chest pain, unspecified: Secondary | ICD-10-CM | POA: Diagnosis not present

## 2013-03-28 DIAGNOSIS — J209 Acute bronchitis, unspecified: Secondary | ICD-10-CM | POA: Diagnosis not present

## 2013-03-28 DIAGNOSIS — Z7982 Long term (current) use of aspirin: Secondary | ICD-10-CM | POA: Diagnosis not present

## 2013-03-28 DIAGNOSIS — R0902 Hypoxemia: Secondary | ICD-10-CM | POA: Diagnosis not present

## 2013-03-28 DIAGNOSIS — E871 Hypo-osmolality and hyponatremia: Secondary | ICD-10-CM | POA: Diagnosis not present

## 2013-03-28 DIAGNOSIS — J441 Chronic obstructive pulmonary disease with (acute) exacerbation: Secondary | ICD-10-CM | POA: Diagnosis not present

## 2013-03-28 DIAGNOSIS — I10 Essential (primary) hypertension: Secondary | ICD-10-CM | POA: Diagnosis not present

## 2013-03-28 DIAGNOSIS — K219 Gastro-esophageal reflux disease without esophagitis: Secondary | ICD-10-CM | POA: Diagnosis not present

## 2013-03-28 DIAGNOSIS — E785 Hyperlipidemia, unspecified: Secondary | ICD-10-CM | POA: Diagnosis not present

## 2013-03-28 DIAGNOSIS — R5381 Other malaise: Secondary | ICD-10-CM | POA: Diagnosis present

## 2013-03-28 DIAGNOSIS — H9190 Unspecified hearing loss, unspecified ear: Secondary | ICD-10-CM | POA: Diagnosis present

## 2013-03-28 DIAGNOSIS — R52 Pain, unspecified: Secondary | ICD-10-CM | POA: Diagnosis not present

## 2013-03-28 DIAGNOSIS — R0602 Shortness of breath: Secondary | ICD-10-CM | POA: Diagnosis not present

## 2013-03-28 DIAGNOSIS — I509 Heart failure, unspecified: Secondary | ICD-10-CM | POA: Diagnosis not present

## 2013-03-28 DIAGNOSIS — I4891 Unspecified atrial fibrillation: Secondary | ICD-10-CM | POA: Diagnosis not present

## 2013-04-04 ENCOUNTER — Telehealth: Payer: Self-pay | Admitting: *Deleted

## 2013-04-04 NOTE — Telephone Encounter (Signed)
Called and lvm and informing that pt's insurance will NOT cover Omeprazole and asked if it would be ok to change to Nexium or he can buy Prilosec OTC.Jared Lee [place]

## 2013-04-05 ENCOUNTER — Ambulatory Visit: Payer: Medicare Other | Admitting: Family Medicine

## 2013-04-07 ENCOUNTER — Other Ambulatory Visit: Payer: Self-pay | Admitting: Family Medicine

## 2013-04-09 MED ORDER — ESOMEPRAZOLE MAGNESIUM 20 MG PO CPDR: 20.0000 mg | DELAYED_RELEASE_CAPSULE | Freq: Every day | ORAL | Status: DC

## 2013-04-09 NOTE — Telephone Encounter (Signed)
Change to nexium per his son.Jared Lee

## 2013-04-09 NOTE — Telephone Encounter (Signed)
New rx sent

## 2013-04-11 ENCOUNTER — Ambulatory Visit: Payer: Medicare Other | Admitting: Family Medicine

## 2013-04-12 ENCOUNTER — Ambulatory Visit (INDEPENDENT_AMBULATORY_CARE_PROVIDER_SITE_OTHER): Payer: Medicare Other | Admitting: Family Medicine

## 2013-04-12 ENCOUNTER — Encounter: Payer: Self-pay | Admitting: Family Medicine

## 2013-04-12 VITALS — BP 126/62 | HR 81 | Wt 163.0 lb

## 2013-04-12 DIAGNOSIS — I509 Heart failure, unspecified: Secondary | ICD-10-CM

## 2013-04-12 DIAGNOSIS — J449 Chronic obstructive pulmonary disease, unspecified: Secondary | ICD-10-CM | POA: Diagnosis not present

## 2013-04-12 MED ORDER — FUROSEMIDE 20 MG PO TABS: 20.0000 mg | ORAL_TABLET | Freq: Every day | ORAL | Status: DC | PRN

## 2013-04-12 MED ORDER — FUROSEMIDE 10 MG/ML IJ SOLN
40.0000 mg | Freq: Once | INTRAMUSCULAR | Status: AC
  Administered 2013-04-12: 40 mg via INTRAMUSCULAR

## 2013-04-12 MED ORDER — TIOTROPIUM BROMIDE MONOHYDRATE 18 MCG IN CAPS: ORAL_CAPSULE | RESPIRATORY_TRACT | Status: DC

## 2013-04-12 MED ORDER — ALBUTEROL SULFATE (2.5 MG/3ML) 0.083% IN NEBU
2.5000 mg | INHALATION_SOLUTION | Freq: Once | RESPIRATORY_TRACT | Status: AC
  Administered 2013-04-12: 2.5 mg via RESPIRATORY_TRACT

## 2013-04-12 MED ORDER — IPRATROPIUM BROMIDE 0.02 % IN SOLN
0.5000 mg | Freq: Once | RESPIRATORY_TRACT | Status: AC
  Administered 2013-04-12: 0.5 mg via RESPIRATORY_TRACT

## 2013-04-12 MED ORDER — ALBUTEROL SULFATE HFA 108 (90 BASE) MCG/ACT IN AERS: INHALATION_SPRAY | RESPIRATORY_TRACT | Status: DC

## 2013-04-12 NOTE — Progress Notes (Signed)
  Subjective:    Patient ID: Jared Lee, male    DOB: [birth year], 72 y.o.   MRN: [id number]  HPI Her hospital followup. He was recently admitted for shortness of breath and diagnosed with congestive heart failure.Marland Kitchen He typically gets admitted for COPD exacerbation every 1-2 months. He typically goes to [facility] in [facility]. He did bring in a weight chart for me to review today. His weight has gone up slowly from 154 pounds at the end of June to 160 pounds today. I he was also discharged home on azithromycin and prednisone. He has completed both of those.  He says he is due for his neb right now and would like to have one in our office. He also says he spriva was decline when he tried to fill it.    We did get a note from [facility] stating he has declined services.   Review of Systems     Objective:   Physical Exam  Constitutional: He is oriented to person, place, and time. He appears well-developed and well-nourished.  HENT:  Head: Normocephalic and atraumatic.  Cardiovascular: Normal rate, regular rhythm and normal heart sounds.   Pulmonary/Chest: Effort normal and breath sounds normal.  Poor air movement.    Musculoskeletal: He exhibits edema.  1+ pitting edema in both ankles to mid tibia bilat. Worse on the left.   Neurological: He is alert and oriented to person, place, and time.  Skin: Skin is warm and dry.  Psychiatric: He has a normal mood and affect. His behavior is normal.          Assessment & Plan:  COPD-given nebulizer treatment here in the office today. Refilled his spiriva. We had not denial of his med on record.     Congestive heart failure- Given lasix 40mg  IM x 1 in the office.   Jared Lee give him a diuretic to take as needed if his weight goes up more than 3 pounds or if he notices significant ankle swelling. I explained to him how this can directly impact his breathing.F/u in 1 mo to recheck electrolytes adn to see how  often using the fluid pill.

## 2013-04-12 NOTE — Patient Instructions (Signed)
Please take the furosemide when you notice that your weight has gone up 3 pounds or more, or if her ankles are swelling. Encourage you not use it every day but only when you needed. We will need to check her electrolytes in about a month. Also please keep a chart on which days you have taken the fluid pill so we can see how often you need it.

## 2013-05-04 ENCOUNTER — Other Ambulatory Visit: Payer: Self-pay | Admitting: *Deleted

## 2013-05-04 MED ORDER — LISINOPRIL 40 MG PO TABS: 40.0000 mg | ORAL_TABLET | Freq: Every day | ORAL | Status: DC

## 2013-05-04 MED ORDER — PRAVASTATIN SODIUM 20 MG PO TABS: ORAL_TABLET | ORAL | Status: DC

## 2013-05-06 DIAGNOSIS — R7301 Impaired fasting glucose: Secondary | ICD-10-CM | POA: Diagnosis not present

## 2013-05-06 DIAGNOSIS — R7309 Other abnormal glucose: Secondary | ICD-10-CM | POA: Diagnosis present

## 2013-05-06 DIAGNOSIS — Z9981 Dependence on supplemental oxygen: Secondary | ICD-10-CM | POA: Diagnosis not present

## 2013-05-06 DIAGNOSIS — R0602 Shortness of breath: Secondary | ICD-10-CM | POA: Diagnosis not present

## 2013-05-06 DIAGNOSIS — J441 Chronic obstructive pulmonary disease with (acute) exacerbation: Secondary | ICD-10-CM | POA: Diagnosis not present

## 2013-05-06 DIAGNOSIS — Z8249 Family history of ischemic heart disease and other diseases of the circulatory system: Secondary | ICD-10-CM | POA: Diagnosis not present

## 2013-05-06 DIAGNOSIS — R0902 Hypoxemia: Secondary | ICD-10-CM | POA: Diagnosis not present

## 2013-05-06 DIAGNOSIS — D696 Thrombocytopenia, unspecified: Secondary | ICD-10-CM | POA: Diagnosis not present

## 2013-05-06 DIAGNOSIS — E785 Hyperlipidemia, unspecified: Secondary | ICD-10-CM | POA: Diagnosis present

## 2013-05-06 DIAGNOSIS — H9190 Unspecified hearing loss, unspecified ear: Secondary | ICD-10-CM | POA: Diagnosis present

## 2013-05-06 DIAGNOSIS — I5020 Unspecified systolic (congestive) heart failure: Secondary | ICD-10-CM | POA: Diagnosis not present

## 2013-05-06 DIAGNOSIS — I10 Essential (primary) hypertension: Secondary | ICD-10-CM | POA: Diagnosis present

## 2013-05-06 DIAGNOSIS — J449 Chronic obstructive pulmonary disease, unspecified: Secondary | ICD-10-CM | POA: Diagnosis not present

## 2013-05-06 DIAGNOSIS — Z79899 Other long term (current) drug therapy: Secondary | ICD-10-CM | POA: Diagnosis not present

## 2013-05-06 DIAGNOSIS — J189 Pneumonia, unspecified organism: Secondary | ICD-10-CM | POA: Diagnosis present

## 2013-05-06 DIAGNOSIS — J479 Bronchiectasis, uncomplicated: Secondary | ICD-10-CM | POA: Diagnosis not present

## 2013-05-06 DIAGNOSIS — J159 Unspecified bacterial pneumonia: Secondary | ICD-10-CM | POA: Diagnosis not present

## 2013-05-06 DIAGNOSIS — I5030 Unspecified diastolic (congestive) heart failure: Secondary | ICD-10-CM | POA: Diagnosis present

## 2013-05-06 DIAGNOSIS — J9610 Chronic respiratory failure, unspecified whether with hypoxia or hypercapnia: Secondary | ICD-10-CM | POA: Diagnosis present

## 2013-05-06 DIAGNOSIS — K219 Gastro-esophageal reflux disease without esophagitis: Secondary | ICD-10-CM | POA: Diagnosis present

## 2013-05-06 DIAGNOSIS — Z7982 Long term (current) use of aspirin: Secondary | ICD-10-CM | POA: Diagnosis not present

## 2013-05-06 DIAGNOSIS — I4891 Unspecified atrial fibrillation: Secondary | ICD-10-CM | POA: Diagnosis present

## 2013-05-06 DIAGNOSIS — R509 Fever, unspecified: Secondary | ICD-10-CM | POA: Diagnosis not present

## 2013-05-06 DIAGNOSIS — I509 Heart failure, unspecified: Secondary | ICD-10-CM | POA: Diagnosis present

## 2013-05-10 ENCOUNTER — Encounter: Payer: Self-pay | Admitting: Family Medicine

## 2013-05-10 ENCOUNTER — Ambulatory Visit (INDEPENDENT_AMBULATORY_CARE_PROVIDER_SITE_OTHER): Payer: Medicare Other | Admitting: Family Medicine

## 2013-05-10 VITALS — BP 113/59 | HR 89 | Temp 98.5°F | Wt 161.0 lb

## 2013-05-10 DIAGNOSIS — R110 Nausea: Secondary | ICD-10-CM

## 2013-05-10 DIAGNOSIS — I498 Other specified cardiac arrhythmias: Secondary | ICD-10-CM

## 2013-05-10 DIAGNOSIS — J441 Chronic obstructive pulmonary disease with (acute) exacerbation: Secondary | ICD-10-CM | POA: Diagnosis not present

## 2013-05-10 DIAGNOSIS — R001 Bradycardia, unspecified: Secondary | ICD-10-CM

## 2013-05-10 DIAGNOSIS — R609 Edema, unspecified: Secondary | ICD-10-CM | POA: Diagnosis not present

## 2013-05-10 DIAGNOSIS — R7301 Impaired fasting glucose: Secondary | ICD-10-CM | POA: Insufficient documentation

## 2013-05-10 DIAGNOSIS — R197 Diarrhea, unspecified: Secondary | ICD-10-CM

## 2013-05-10 MED ORDER — FUROSEMIDE 10 MG/ML IJ SOLN
40.0000 mg | Freq: Once | INTRAMUSCULAR | Status: AC
  Administered 2013-05-10: 40 mg via INTRAMUSCULAR

## 2013-05-10 NOTE — Progress Notes (Signed)
Subjective:    Patient ID: Jared Lee, male    DOB: 1941/07/30, 72 y.o.   MRN: [id number]  HPI Here for hospital followup today. He was [facility]. His weight increased from 154 pounds to 163 pounds while he was there.  He says wile there he was there that he had an allergic reaction to azithromycin and prednisone. Though it is not very clear what exactly his reaction was when I asked him. Evidently he did not have a rash. We are still going to get records. He is very concerned about the weight gain and the fact that steroids can increase blood sugar levels. He said he asked him to check his blood sugar before eating several times while he was in the hospital and says that they would not. He also thinks that the Advair could be causing some problems and says he wants to hold off on using it. He has been using his home nebulizers every 4 hours as needed. He feels like he was actually discharged too soon. He said he exercised for worse the day that he was discharged home. He also reports that his been vomiting and having diarrhea for about the last week. He says the last time he throughout was this morning.  Slow HR as well.  He says he wants to know what we are going to do about his low heart rate. He says while he was in the hospital he was told his heart rate was low. He says he feels like his heart skips beats and doesn't feel well when his heart rate dropped low.  Review of Systems  BP 113/59  Pulse 89  Temp(Src) 98.5 F (36.9 C)  Wt 161 lb (73.029 kg)  BMI 22.46 kg/m2  SpO2 92%    Allergies  Allergen Reactions  . Daliresp [Roflumilast] Other (See Comments)    HA, diarrhea   . Levaquin [Levofloxacin]     Past Medical History  Diagnosis Date  . Hypertension   . H/O colonoscopy 02/03/12    EGD as well. Mildy nodular gastritis.    Marland Kitchen COPD (chronic obstructive pulmonary disease)   . Normal cardiac stress test 04/14/12    [facility]  . Hyperlipemia     No past  surgical history on file.  History   Social History  . Marital Status: Single    Spouse Name: N/A    Number of Children: N/A  . Years of Education: N/A   Occupational History  . Not on file.   Social History Main Topics  . Smoking status: Former Smoker -- 1.00 packs/day for 66 years    Types: Cigarettes    Quit date: 08/31/2011  . Smokeless tobacco: Never Used  . Alcohol Use: No  . Drug Use: No  . Sexual Activity: Not on file   Other Topics Concern  . Not on file   Social History Narrative  . No narrative on file    Family History  Problem Relation Age of Onset  . Heart disease Father   . Hypertension Father   . Emphysema Father   . Emphysema Paternal Grandfather     Outpatient Encounter Prescriptions as of 05/10/2013  Medication Sig Dispense Refill  . ADVAIR DISKUS 250-50 MCG/DOSE AEPB INHALE ONE DOSE BY MOUTH TWICE DAILY  60 each  0  . albuterol (PROAIR HFA) 108 (90 BASE) MCG/ACT inhaler INHALE TWO TO FOUR PUFFS BY MOUTH EVERY 6 HOURS AS NEEDED FOR WHEEZING AND FOR SHORTNESS OF BREATH  18 each  6  . albuterol (PROVENTIL) (2.5 MG/3ML) 0.083% nebulizer solution Take 3 mLs (2.5 mg total) by nebulization every 6 (six) hours as needed for wheezing or shortness of breath.  525 mL  75  . AMBULATORY NON FORMULARY MEDICATION Medication Name: Nebulizer machine. Dx COPD. [facility].  1 vial  0  . AMBULATORY NON FORMULARY MEDICATION Medication Name: Ambulatory oxygen cocentrator.  Pt is mobile within th home.  Pt requests portable concentrator. Using [facility] for [facility] needs.  1 Units  0  . aspirin 81 MG tablet Take 81 mg by mouth daily.      Marland Kitchen esomeprazole (NEXIUM) 20 MG capsule Take 1 capsule (20 mg total) by mouth daily before breakfast.  30 capsule  3  . pravastatin (PRAVACHOL) 20 MG tablet TAKE ONE TABLET BY MOUTH ONCE DAILY  90 tablet  2  . tiotropium (SPIRIVA HANDIHALER) 18 MCG inhalation capsule INHALE CONTENTS OF ONE CAPSULE BY MOUTH VIA HANDIHALER ONCE DAILY  30  capsule  11  . [DISCONTINUED] furosemide (LASIX) 20 MG tablet Take 1 tablet (20 mg total) by mouth daily as needed for fluid or edema.  20 tablet  1  . [DISCONTINUED] lisinopril (PRINIVIL,ZESTRIL) 40 MG tablet Take 1 tablet (40 mg total) by mouth daily.  90 tablet  2  . [EXPIRED] furosemide (LASIX) injection 40 mg        No facility-administered encounter medications on file as of 05/10/2013.          Objective:   Physical Exam  Constitutional: He is oriented to person, place, and time. He appears well-developed and well-nourished.  HENT:  Head: Normocephalic and atraumatic.  Cardiovascular: Normal rate, regular rhythm and normal heart sounds.   Pulmonary/Chest: Effort normal and breath sounds normal. No respiratory distress. He has no wheezes.  Neurological: He is alert and oriented to person, place, and time.  Skin: Skin is warm and dry.  Psychiatric: He has a normal mood and affect. His behavior is normal.          Assessment & Plan:  COPD exacerbation - he is definitely struggling to breathe though he is quite short of breath even at his baseline. He says he doesn't want to take prednisone. If he gets worse this evening and I strongly encouraged him to get the emergency department. I asked him if he wanted to go to the hospital here locally and he said no. His son-in-law is here with him today but is not in the room. He typically waits on the waiting room.  Nausea vomiting diarrhea-unclear etiology. This started about a week ago. Again I want to get records from [facility]. Unfortunately we have we have not received these while he was still here for his office visit.  Edema - Will give Lasix IM 40mg  today. He does not appear to be dehydrated on exam even though he says he's vomited and had some diarrhea. He is also not tachycardic and his blood pressure looks well controlled.  History of recurrent steroids-would like to do an A1c today just to screen for diabetes. I  also gave him some reassurance especially if it's normal.  IFG -  A1C is 6.6.  He is technically in the diabetic range. With his brittle health problems I really don't think I want to start him on oral medication since technically his A1c is under 7 at this point time. We will just follow him very closely and repeat a hemoglobin A1c in approximately 3  months. Strongly suspect that his numbers are abnormal because of recurrent use of prednisone over the last couple of years for his COPD exacerbations.  Bradycardia-today his heart rate is normal. But it sounds like he is having episodes of bradycardia where he doesn't feel well and can feel his heart skipping beats. Recommend referral to cardiology for further evaluation workup. They did do an EKG at the hospital and I will call and get a copy of this. I did not repeat one today in the office.

## 2013-05-14 ENCOUNTER — Ambulatory Visit: Payer: Medicare Other | Admitting: Family Medicine

## 2013-05-14 DIAGNOSIS — Z79899 Other long term (current) drug therapy: Secondary | ICD-10-CM | POA: Diagnosis not present

## 2013-05-14 DIAGNOSIS — J9620 Acute and chronic respiratory failure, unspecified whether with hypoxia or hypercapnia: Secondary | ICD-10-CM | POA: Diagnosis not present

## 2013-05-14 DIAGNOSIS — R0602 Shortness of breath: Secondary | ICD-10-CM | POA: Diagnosis present

## 2013-05-14 DIAGNOSIS — I10 Essential (primary) hypertension: Secondary | ICD-10-CM | POA: Diagnosis not present

## 2013-05-14 DIAGNOSIS — J40 Bronchitis, not specified as acute or chronic: Secondary | ICD-10-CM | POA: Diagnosis not present

## 2013-05-14 DIAGNOSIS — J9610 Chronic respiratory failure, unspecified whether with hypoxia or hypercapnia: Secondary | ICD-10-CM | POA: Diagnosis not present

## 2013-05-14 DIAGNOSIS — J449 Chronic obstructive pulmonary disease, unspecified: Secondary | ICD-10-CM | POA: Diagnosis not present

## 2013-05-14 DIAGNOSIS — R0789 Other chest pain: Secondary | ICD-10-CM | POA: Diagnosis not present

## 2013-05-14 DIAGNOSIS — R112 Nausea with vomiting, unspecified: Secondary | ICD-10-CM | POA: Diagnosis not present

## 2013-05-14 DIAGNOSIS — Z7982 Long term (current) use of aspirin: Secondary | ICD-10-CM | POA: Diagnosis not present

## 2013-05-14 DIAGNOSIS — Z5320 Procedure and treatment not carried out because of patient's decision for unspecified reasons: Secondary | ICD-10-CM | POA: Diagnosis not present

## 2013-05-14 DIAGNOSIS — R918 Other nonspecific abnormal finding of lung field: Secondary | ICD-10-CM | POA: Diagnosis not present

## 2013-05-14 DIAGNOSIS — E139 Other specified diabetes mellitus without complications: Secondary | ICD-10-CM | POA: Diagnosis present

## 2013-05-14 DIAGNOSIS — J441 Chronic obstructive pulmonary disease with (acute) exacerbation: Secondary | ICD-10-CM | POA: Diagnosis not present

## 2013-05-14 DIAGNOSIS — E119 Type 2 diabetes mellitus without complications: Secondary | ICD-10-CM | POA: Diagnosis present

## 2013-05-14 DIAGNOSIS — R7301 Impaired fasting glucose: Secondary | ICD-10-CM | POA: Diagnosis not present

## 2013-05-14 DIAGNOSIS — Z9981 Dependence on supplemental oxygen: Secondary | ICD-10-CM | POA: Diagnosis not present

## 2013-05-14 DIAGNOSIS — E785 Hyperlipidemia, unspecified: Secondary | ICD-10-CM | POA: Diagnosis not present

## 2013-05-14 DIAGNOSIS — R197 Diarrhea, unspecified: Secondary | ICD-10-CM | POA: Diagnosis not present

## 2013-05-16 DIAGNOSIS — R112 Nausea with vomiting, unspecified: Secondary | ICD-10-CM | POA: Diagnosis not present

## 2013-05-16 DIAGNOSIS — R0602 Shortness of breath: Secondary | ICD-10-CM | POA: Diagnosis not present

## 2013-05-16 DIAGNOSIS — J4489 Other specified chronic obstructive pulmonary disease: Secondary | ICD-10-CM | POA: Diagnosis not present

## 2013-05-16 DIAGNOSIS — R918 Other nonspecific abnormal finding of lung field: Secondary | ICD-10-CM | POA: Diagnosis not present

## 2013-05-16 DIAGNOSIS — R197 Diarrhea, unspecified: Secondary | ICD-10-CM | POA: Diagnosis not present

## 2013-05-22 ENCOUNTER — Encounter: Payer: Self-pay | Admitting: Family Medicine

## 2013-05-22 ENCOUNTER — Ambulatory Visit (INDEPENDENT_AMBULATORY_CARE_PROVIDER_SITE_OTHER): Payer: Medicaid Other | Admitting: Family Medicine

## 2013-05-22 VITALS — BP 153/86 | HR 78 | Wt 164.0 lb

## 2013-05-22 DIAGNOSIS — R7301 Impaired fasting glucose: Secondary | ICD-10-CM | POA: Diagnosis not present

## 2013-05-22 DIAGNOSIS — I509 Heart failure, unspecified: Secondary | ICD-10-CM | POA: Diagnosis not present

## 2013-05-22 DIAGNOSIS — R197 Diarrhea, unspecified: Secondary | ICD-10-CM

## 2013-05-22 DIAGNOSIS — Z23 Encounter for immunization: Secondary | ICD-10-CM | POA: Diagnosis not present

## 2013-05-22 DIAGNOSIS — J441 Chronic obstructive pulmonary disease with (acute) exacerbation: Secondary | ICD-10-CM | POA: Diagnosis not present

## 2013-05-22 LAB — POCT CBG (FASTING - GLUCOSE)-MANUAL ENTRY: Glucose Fasting, POC: 180 mg/dL — AB (ref 70–99)

## 2013-05-22 MED ORDER — GLIPIZIDE ER 2.5 MG PO TB24: 2.5000 mg | ORAL_TABLET | Freq: Every day | ORAL | Status: DC

## 2013-05-22 MED ORDER — ALBUTEROL SULFATE (2.5 MG/3ML) 0.083% IN NEBU
2.5000 mg | INHALATION_SOLUTION | Freq: Once | RESPIRATORY_TRACT | Status: AC
  Administered 2013-05-22: 2.5 mg via RESPIRATORY_TRACT

## 2013-05-22 NOTE — Progress Notes (Addendum)
  Subjective:    Patient ID: Jared Lee, male    DOB: July 22, 1941, 72 y.o.   MRN: [id number]  HPI Recently hospitalized for COPD exacerbation. Given steroid. He finished his antibiotic.  Wants his sugar checked Stopped his Advair bc he is worried about his blood sugar bc of the steroid.  Last at at [facility] today.  Given neb here in th eoffice. He had about 10 days of diarrhea that got better while he was at the hospital. Has started back since he has been home. He's not sure what is causing this. He's not had any blood in the stool. He would also like me to recheck his blood sugar to make sure that he is doing okay with that. He is very short of breath today. He did stop his albuterol recently because he was worried about the inhaled cortical steroid raising his blood sugar and he felt was unsafe.   Review of Systems     Objective:   Physical Exam  Constitutional: He is oriented to person, place, and time. He appears well-developed and well-nourished.  HENT:  Head: Normocephalic and atraumatic.  Cardiovascular: Normal rate, regular rhythm and normal heart sounds.   Pulmonary/Chest: Effort normal and breath sounds normal.  Poor air movement. No wheezing today  Neurological: He is alert and oriented to person, place, and time.  Skin: Skin is warm and dry.  Psychiatric: He has a normal mood and affect. His behavior is normal.          Assessment & Plan:  COPD exacerbation-discussed with him the importance of taking Advair. We did discuss there is an increased risk of diabetes abnormal blood sugars but that it is also helping to control his symptoms and keep him out of the hospital. He says he agrees and will restart the Advair this evening. We did give him a nebulizer treatment here in the office. Did not give him steroids. He is taking Spiriva as well.  Diarrhea - unclear etiology. He confirmed he is actually using the Spiriva in the device and not taking it orally. I'm not sure would be  different from home versus in the hospital, especially for as far as the diarrhea is concerned. I would like to get records from last hospitalization [facility] to see if they tested him for C. difficile since he has been on multiple courses of antibiotics over the summer. C diff is negative per hospital records. Mad e sure he was not accidentally swallowing the spiriva.    DM- we'll start glipizide for control. I would hesitate to use metformin because he is Re: expensing diarrhea and don't want to exacerbate his symptoms. Will start with a low dose. Reminded him to make sure that he is not skipping any meals to avoid hypoglycemic events.  This is definitely steroid induced.   CHF - Should be on ACE and diuretic. Will call pharm to see if these meds were dispensed and what doses.

## 2013-05-25 ENCOUNTER — Encounter: Payer: Self-pay | Admitting: Physician Assistant

## 2013-05-25 ENCOUNTER — Ambulatory Visit (INDEPENDENT_AMBULATORY_CARE_PROVIDER_SITE_OTHER): Payer: Medicare Other | Admitting: Physician Assistant

## 2013-05-25 VITALS — BP 144/87 | HR 83 | Wt 168.0 lb

## 2013-05-25 DIAGNOSIS — R7301 Impaired fasting glucose: Secondary | ICD-10-CM | POA: Diagnosis not present

## 2013-05-25 DIAGNOSIS — J441 Chronic obstructive pulmonary disease with (acute) exacerbation: Secondary | ICD-10-CM | POA: Diagnosis not present

## 2013-05-25 DIAGNOSIS — I509 Heart failure, unspecified: Secondary | ICD-10-CM

## 2013-05-25 LAB — GLUCOSE, POCT (MANUAL RESULT ENTRY): POC Glucose: 169 mg/dl — AB (ref 70–99)

## 2013-05-25 MED ORDER — DOXYCYCLINE HYCLATE 100 MG PO CAPS: 100.0000 mg | ORAL_CAPSULE | Freq: Two times a day (BID) | ORAL | Status: DC

## 2013-05-25 MED ORDER — FUROSEMIDE 20 MG PO TABS: 20.0000 mg | ORAL_TABLET | Freq: Every day | ORAL | Status: DC

## 2013-05-25 MED ORDER — FUROSEMIDE 10 MG/ML IJ SOLN
40.0000 mg | Freq: Once | INTRAMUSCULAR | Status: AC
  Administered 2013-05-25: 40 mg via INTRAMUSCULAR

## 2013-05-25 MED ORDER — PREDNISONE 20 MG PO TABS: 20.0000 mg | ORAL_TABLET | Freq: Every day | ORAL | Status: DC

## 2013-05-25 NOTE — Patient Instructions (Signed)
Start Doxycycline and prednisone. Make sure you are taking furosemide 20mg  daily. Compare with meds you have at home. Follow up next week with [facility].

## 2013-05-25 NOTE — Progress Notes (Signed)
  Subjective:    Patient ID: Jared Lee, male    DOB: [birth year], 72 y.o.   MRN: [id number]  HPI Patient presents to the clinic with SOB, weight gain today. Pt was recently in hospital for COPD/CHF exacerbation on 9/17. He saw Dr. Linford Arnold on 9/24. She started him back on Advair since he had stopped thinking it was making his DM worse. Pt has gained 4lbs in 3 days and increasingly SOB. Pt been feeling bad for 6 months and seems like day after day nothing is improving. He denies fever today. He does have a productive cough with green/yellow sputum. Swelling of lower extremities is noted. Weak and fatigued. Using nebulizer every 4 hours.    Review of Systems     Objective:   Physical Exam  Constitutional:  Pt on 2L of O2.   HENT:  Head: Normocephalic and atraumatic.  Neck: No JVD present.  Cardiovascular: Normal rate, regular rhythm and normal heart sounds.   Pulmonary/Chest:  2L of O2. Labored breathing. Rales/rhonchi bilaterally.wet cough.  Skin: He is diaphoretic.  1+ edema up to calf in bilateral legs.   Psychiatric: He has a normal mood and affect. His behavior is normal.          Assessment & Plan:  COPD exacerbation- Due to pt having so much mucus I did give doxycyline and a steroid. Continue on O2 2L at home if worsening go to ER. Stay on advair/spiriva and nebulizer every 4 hours or as needed.  CHF- Would like to get a BNP today. Lasix 40mg  IM was given in office today. Lasix is not shown on hospital records but when external pharmacy was called believe pt was on it. Gave 20mg  told him to compare with other medications and to only take this tablet once a day.Pt needs to be on diuretic. Continue on lisinopril.   Elevated glucose- Random 166. A1C ordered. Pt not had in awhile. likey sugars are worse with recent prednisone.   Pt instructed to come back in 1 week and to bring all meds in to go over and to make sure improving.

## 2013-05-26 DIAGNOSIS — E785 Hyperlipidemia, unspecified: Secondary | ICD-10-CM | POA: Diagnosis not present

## 2013-05-26 DIAGNOSIS — J4489 Other specified chronic obstructive pulmonary disease: Secondary | ICD-10-CM | POA: Diagnosis not present

## 2013-05-26 DIAGNOSIS — Z79899 Other long term (current) drug therapy: Secondary | ICD-10-CM | POA: Diagnosis not present

## 2013-05-26 DIAGNOSIS — J159 Unspecified bacterial pneumonia: Secondary | ICD-10-CM | POA: Diagnosis not present

## 2013-05-26 DIAGNOSIS — H9190 Unspecified hearing loss, unspecified ear: Secondary | ICD-10-CM | POA: Diagnosis present

## 2013-05-26 DIAGNOSIS — R5381 Other malaise: Secondary | ICD-10-CM | POA: Diagnosis not present

## 2013-05-26 DIAGNOSIS — I509 Heart failure, unspecified: Secondary | ICD-10-CM | POA: Diagnosis not present

## 2013-05-26 DIAGNOSIS — R0902 Hypoxemia: Secondary | ICD-10-CM | POA: Diagnosis not present

## 2013-05-26 DIAGNOSIS — Z87891 Personal history of nicotine dependence: Secondary | ICD-10-CM | POA: Diagnosis not present

## 2013-05-26 DIAGNOSIS — J449 Chronic obstructive pulmonary disease, unspecified: Secondary | ICD-10-CM | POA: Diagnosis not present

## 2013-05-26 DIAGNOSIS — J189 Pneumonia, unspecified organism: Secondary | ICD-10-CM | POA: Diagnosis not present

## 2013-05-26 DIAGNOSIS — Z8249 Family history of ischemic heart disease and other diseases of the circulatory system: Secondary | ICD-10-CM | POA: Diagnosis not present

## 2013-05-26 DIAGNOSIS — J158 Pneumonia due to other specified bacteria: Secondary | ICD-10-CM | POA: Diagnosis not present

## 2013-05-26 DIAGNOSIS — I10 Essential (primary) hypertension: Secondary | ICD-10-CM | POA: Diagnosis not present

## 2013-05-26 DIAGNOSIS — R0789 Other chest pain: Secondary | ICD-10-CM | POA: Diagnosis not present

## 2013-05-26 DIAGNOSIS — Z7982 Long term (current) use of aspirin: Secondary | ICD-10-CM | POA: Diagnosis not present

## 2013-05-26 DIAGNOSIS — J441 Chronic obstructive pulmonary disease with (acute) exacerbation: Secondary | ICD-10-CM | POA: Diagnosis not present

## 2013-05-26 DIAGNOSIS — Z9981 Dependence on supplemental oxygen: Secondary | ICD-10-CM | POA: Diagnosis not present

## 2013-05-26 DIAGNOSIS — I4891 Unspecified atrial fibrillation: Secondary | ICD-10-CM | POA: Diagnosis present

## 2013-05-26 DIAGNOSIS — J9620 Acute and chronic respiratory failure, unspecified whether with hypoxia or hypercapnia: Secondary | ICD-10-CM | POA: Diagnosis not present

## 2013-05-26 DIAGNOSIS — K219 Gastro-esophageal reflux disease without esophagitis: Secondary | ICD-10-CM | POA: Diagnosis present

## 2013-05-26 DIAGNOSIS — R509 Fever, unspecified: Secondary | ICD-10-CM | POA: Diagnosis not present

## 2013-05-26 DIAGNOSIS — R0602 Shortness of breath: Secondary | ICD-10-CM | POA: Diagnosis not present

## 2013-05-26 LAB — HEMOGLOBIN A1C: Mean Plasma Glucose: 154 mg/dL — ABNORMAL HIGH (ref <117)

## 2013-05-26 LAB — BRAIN NATRIURETIC PEPTIDE: Brain Natriuretic Peptide: 59.8 pg/mL (ref 0.0–100.0)

## 2013-05-27 ENCOUNTER — Encounter: Payer: Self-pay | Admitting: Physician Assistant

## 2013-05-27 DIAGNOSIS — E119 Type 2 diabetes mellitus without complications: Secondary | ICD-10-CM | POA: Insufficient documentation

## 2013-05-31 DIAGNOSIS — I509 Heart failure, unspecified: Secondary | ICD-10-CM | POA: Diagnosis not present

## 2013-05-31 DIAGNOSIS — J189 Pneumonia, unspecified organism: Secondary | ICD-10-CM | POA: Diagnosis not present

## 2013-05-31 DIAGNOSIS — E785 Hyperlipidemia, unspecified: Secondary | ICD-10-CM | POA: Diagnosis not present

## 2013-05-31 DIAGNOSIS — I4891 Unspecified atrial fibrillation: Secondary | ICD-10-CM | POA: Diagnosis not present

## 2013-05-31 DIAGNOSIS — K219 Gastro-esophageal reflux disease without esophagitis: Secondary | ICD-10-CM | POA: Diagnosis not present

## 2013-05-31 DIAGNOSIS — I10 Essential (primary) hypertension: Secondary | ICD-10-CM | POA: Diagnosis not present

## 2013-05-31 DIAGNOSIS — J441 Chronic obstructive pulmonary disease with (acute) exacerbation: Secondary | ICD-10-CM | POA: Diagnosis not present

## 2013-06-01 DIAGNOSIS — Z9981 Dependence on supplemental oxygen: Secondary | ICD-10-CM | POA: Diagnosis not present

## 2013-06-01 DIAGNOSIS — I509 Heart failure, unspecified: Secondary | ICD-10-CM | POA: Diagnosis not present

## 2013-06-01 DIAGNOSIS — Z79899 Other long term (current) drug therapy: Secondary | ICD-10-CM | POA: Diagnosis not present

## 2013-06-01 DIAGNOSIS — E785 Hyperlipidemia, unspecified: Secondary | ICD-10-CM | POA: Diagnosis not present

## 2013-06-01 DIAGNOSIS — Z7982 Long term (current) use of aspirin: Secondary | ICD-10-CM | POA: Diagnosis not present

## 2013-06-01 DIAGNOSIS — R748 Abnormal levels of other serum enzymes: Secondary | ICD-10-CM | POA: Diagnosis not present

## 2013-06-01 DIAGNOSIS — H9190 Unspecified hearing loss, unspecified ear: Secondary | ICD-10-CM | POA: Diagnosis not present

## 2013-06-01 DIAGNOSIS — M7989 Other specified soft tissue disorders: Secondary | ICD-10-CM | POA: Diagnosis not present

## 2013-06-01 DIAGNOSIS — D72829 Elevated white blood cell count, unspecified: Secondary | ICD-10-CM | POA: Diagnosis not present

## 2013-06-01 DIAGNOSIS — I10 Essential (primary) hypertension: Secondary | ICD-10-CM | POA: Diagnosis not present

## 2013-06-01 DIAGNOSIS — R0602 Shortness of breath: Secondary | ICD-10-CM | POA: Diagnosis not present

## 2013-06-01 DIAGNOSIS — K219 Gastro-esophageal reflux disease without esophagitis: Secondary | ICD-10-CM | POA: Diagnosis not present

## 2013-06-01 DIAGNOSIS — M549 Dorsalgia, unspecified: Secondary | ICD-10-CM | POA: Diagnosis not present

## 2013-06-01 DIAGNOSIS — I4891 Unspecified atrial fibrillation: Secondary | ICD-10-CM | POA: Diagnosis not present

## 2013-06-01 DIAGNOSIS — M25519 Pain in unspecified shoulder: Secondary | ICD-10-CM | POA: Diagnosis not present

## 2013-06-01 DIAGNOSIS — J449 Chronic obstructive pulmonary disease, unspecified: Secondary | ICD-10-CM | POA: Diagnosis not present

## 2013-06-02 DIAGNOSIS — J441 Chronic obstructive pulmonary disease with (acute) exacerbation: Secondary | ICD-10-CM | POA: Diagnosis not present

## 2013-06-02 DIAGNOSIS — I4891 Unspecified atrial fibrillation: Secondary | ICD-10-CM | POA: Diagnosis not present

## 2013-06-02 DIAGNOSIS — K219 Gastro-esophageal reflux disease without esophagitis: Secondary | ICD-10-CM | POA: Diagnosis not present

## 2013-06-02 DIAGNOSIS — I10 Essential (primary) hypertension: Secondary | ICD-10-CM | POA: Diagnosis not present

## 2013-06-02 DIAGNOSIS — I509 Heart failure, unspecified: Secondary | ICD-10-CM | POA: Diagnosis not present

## 2013-06-02 DIAGNOSIS — J189 Pneumonia, unspecified organism: Secondary | ICD-10-CM | POA: Diagnosis not present

## 2013-06-04 DIAGNOSIS — Z79899 Other long term (current) drug therapy: Secondary | ICD-10-CM | POA: Diagnosis not present

## 2013-06-04 DIAGNOSIS — R0602 Shortness of breath: Secondary | ICD-10-CM | POA: Diagnosis not present

## 2013-06-04 DIAGNOSIS — R079 Chest pain, unspecified: Secondary | ICD-10-CM | POA: Diagnosis not present

## 2013-06-04 DIAGNOSIS — Z881 Allergy status to other antibiotic agents status: Secondary | ICD-10-CM | POA: Diagnosis not present

## 2013-06-04 DIAGNOSIS — J449 Chronic obstructive pulmonary disease, unspecified: Secondary | ICD-10-CM | POA: Diagnosis not present

## 2013-06-04 DIAGNOSIS — I4891 Unspecified atrial fibrillation: Secondary | ICD-10-CM | POA: Diagnosis not present

## 2013-06-04 DIAGNOSIS — I509 Heart failure, unspecified: Secondary | ICD-10-CM | POA: Diagnosis not present

## 2013-06-04 DIAGNOSIS — R609 Edema, unspecified: Secondary | ICD-10-CM | POA: Diagnosis not present

## 2013-06-04 DIAGNOSIS — H9190 Unspecified hearing loss, unspecified ear: Secondary | ICD-10-CM | POA: Diagnosis not present

## 2013-06-04 DIAGNOSIS — I10 Essential (primary) hypertension: Secondary | ICD-10-CM | POA: Diagnosis not present

## 2013-06-04 DIAGNOSIS — Z888 Allergy status to other drugs, medicaments and biological substances status: Secondary | ICD-10-CM | POA: Diagnosis not present

## 2013-06-06 DIAGNOSIS — I4891 Unspecified atrial fibrillation: Secondary | ICD-10-CM | POA: Diagnosis not present

## 2013-06-06 DIAGNOSIS — I10 Essential (primary) hypertension: Secondary | ICD-10-CM | POA: Diagnosis not present

## 2013-06-06 DIAGNOSIS — J189 Pneumonia, unspecified organism: Secondary | ICD-10-CM | POA: Diagnosis not present

## 2013-06-06 DIAGNOSIS — I509 Heart failure, unspecified: Secondary | ICD-10-CM | POA: Diagnosis not present

## 2013-06-06 DIAGNOSIS — J441 Chronic obstructive pulmonary disease with (acute) exacerbation: Secondary | ICD-10-CM | POA: Diagnosis not present

## 2013-06-06 DIAGNOSIS — K219 Gastro-esophageal reflux disease without esophagitis: Secondary | ICD-10-CM | POA: Diagnosis not present

## 2013-06-07 DIAGNOSIS — I10 Essential (primary) hypertension: Secondary | ICD-10-CM

## 2013-06-07 DIAGNOSIS — J441 Chronic obstructive pulmonary disease with (acute) exacerbation: Secondary | ICD-10-CM | POA: Diagnosis not present

## 2013-06-07 DIAGNOSIS — J189 Pneumonia, unspecified organism: Secondary | ICD-10-CM | POA: Diagnosis not present

## 2013-06-07 DIAGNOSIS — I509 Heart failure, unspecified: Secondary | ICD-10-CM | POA: Diagnosis not present

## 2013-06-08 ENCOUNTER — Ambulatory Visit (INDEPENDENT_AMBULATORY_CARE_PROVIDER_SITE_OTHER): Payer: Medicare Other | Admitting: Family Medicine

## 2013-06-08 ENCOUNTER — Encounter: Payer: Self-pay | Admitting: Family Medicine

## 2013-06-08 VITALS — BP 140/74 | HR 79

## 2013-06-08 DIAGNOSIS — R197 Diarrhea, unspecified: Secondary | ICD-10-CM

## 2013-06-08 DIAGNOSIS — J441 Chronic obstructive pulmonary disease with (acute) exacerbation: Secondary | ICD-10-CM

## 2013-06-08 DIAGNOSIS — I10 Essential (primary) hypertension: Secondary | ICD-10-CM | POA: Diagnosis not present

## 2013-06-08 DIAGNOSIS — J189 Pneumonia, unspecified organism: Secondary | ICD-10-CM | POA: Diagnosis not present

## 2013-06-08 DIAGNOSIS — K219 Gastro-esophageal reflux disease without esophagitis: Secondary | ICD-10-CM | POA: Diagnosis not present

## 2013-06-08 DIAGNOSIS — I509 Heart failure, unspecified: Secondary | ICD-10-CM

## 2013-06-08 DIAGNOSIS — I4891 Unspecified atrial fibrillation: Secondary | ICD-10-CM | POA: Diagnosis not present

## 2013-06-08 MED ORDER — LISINOPRIL 5 MG PO TABS: 5.0000 mg | ORAL_TABLET | Freq: Every day | ORAL | Status: DC

## 2013-06-08 MED ORDER — FUROSEMIDE 40 MG PO TABS: 40.0000 mg | ORAL_TABLET | Freq: Every day | ORAL | Status: DC

## 2013-06-08 NOTE — Progress Notes (Signed)
Subjective:    Patient ID: Jared Lee, male    DOB: 07/14/1941, 72 y.o.   MRN: [id number]  HPI Recently admitted to [facility] for COPD exacerbation. He was seen on 06/04/2013 for shortness of breath and leg edema. His hemoglobin was a little bit low at 12.5 during that visit. Cardiac enzymes were negative for heart attack. They did discharge him home with home health. He is letting them come out of the house. He has declined their services several times in the past. He brought in empty bottles of prednisone and lasix. Completed his steroids about on 05/27/13.  Had right red blood in the stool a few days ago it does not look like a change to his medications. He has still continued to have some intermittent diarrhea. He says it will get better for couple days and then come back for a few days. He thinks it's secondary to his medication regimen. It looks like they did do stool cultures and C. difficile cultures in the hospital which were negative.   Review of Systems     BP 140/74  Pulse 79  SpO2 94%    Allergies  Allergen Reactions  . Daliresp [Roflumilast] Other (See Comments)    HA, diarrhea   . Levaquin [Levofloxacin]     Past Medical History  Diagnosis Date  . Hypertension   . H/O colonoscopy 02/03/12    EGD as well. Mildy nodular gastritis.    Marland Kitchen COPD (chronic obstructive pulmonary disease)   . Normal cardiac stress test 04/14/12    [facility]  . Hyperlipemia     No past surgical history on file.  History   Social History  . Marital Status: Single    Spouse Name: N/A    Number of Children: N/A  . Years of Education: N/A   Occupational History  . Not on file.   Social History Main Topics  . Smoking status: Former Smoker -- 1.00 packs/day for 66 years    Types: Cigarettes    Quit date: 08/31/2011  . Smokeless tobacco: Never Used  . Alcohol Use: No  . Drug Use: No  . Sexual Activity: Not on file   Other Topics Concern  . Not on file   Social  History Narrative  . No narrative on file    Family History  Problem Relation Age of Onset  . Heart disease Father   . Hypertension Father   . Emphysema Father   . Emphysema Paternal Grandfather     Outpatient Encounter Prescriptions as of 06/08/2013  Medication Sig Dispense Refill  . ADVAIR DISKUS 250-50 MCG/DOSE AEPB INHALE ONE DOSE BY MOUTH TWICE DAILY  60 each  0  . albuterol (PROAIR HFA) 108 (90 BASE) MCG/ACT inhaler INHALE TWO TO FOUR PUFFS BY MOUTH EVERY 6 HOURS AS NEEDED FOR WHEEZING AND FOR SHORTNESS OF BREATH  18 each  6  . albuterol (PROVENTIL) (2.5 MG/3ML) 0.083% nebulizer solution Take 3 mLs (2.5 mg total) by nebulization every 6 (six) hours as needed for wheezing or shortness of breath.  525 mL  75  . AMBULATORY NON FORMULARY MEDICATION Medication Name: Nebulizer machine. Dx COPD. [facility].  1 vial  0  . AMBULATORY NON FORMULARY MEDICATION Medication Name: Ambulatory oxygen cocentrator.  Pt is mobile within th home.  Pt requests portable concentrator. Using [facility] for oxygen needs.  1 Units  0  . aspirin 81 MG tablet Take 81 mg by mouth daily.      Marland Kitchen  esomeprazole (NEXIUM) 20 MG capsule Take 1 capsule (20 mg total) by mouth daily before breakfast.  30 capsule  3  . furosemide (LASIX) 40 MG tablet Take 1 tablet (40 mg total) by mouth daily.  30 tablet  2  . glipiZIDE (GLIPIZIDE XL) 2.5 MG 24 hr tablet Take 1 tablet (2.5 mg total) by mouth daily.  30 tablet  2  . pravastatin (PRAVACHOL) 20 MG tablet TAKE ONE TABLET BY MOUTH ONCE DAILY  90 tablet  2  . tiotropium (SPIRIVA HANDIHALER) 18 MCG inhalation capsule INHALE CONTENTS OF ONE CAPSULE BY MOUTH VIA HANDIHALER ONCE DAILY  30 capsule  11  . [DISCONTINUED] doxycycline (VIBRAMYCIN) 100 MG capsule Take 1 capsule (100 mg total) by mouth 2 (two) times daily.  20 capsule  0  . [DISCONTINUED] furosemide (LASIX) 20 MG tablet Take 1 tablet (20 mg total) by mouth daily.  30 tablet  1  . [DISCONTINUED] predniSONE (DELTASONE)  20 MG tablet Take 1 tablet (20 mg total) by mouth daily. Take 2 tablets for 5 days and then 1 tablet for 5 days.  15 tablet  0   No facility-administered encounter medications on file as of 06/08/2013.       Objective:   Physical Exam  Constitutional: He is oriented to person, place, and time. He appears well-developed and well-nourished.  HENT:  Head: Normocephalic and atraumatic.  Cardiovascular: Normal rate, regular rhythm and normal heart sounds.   Pulmonary/Chest: Effort normal and breath sounds normal.  Abdominal: Soft. Bowel sounds are normal. He exhibits no distension and no mass. There is no tenderness. There is no rebound and no guarding.  Neurological: He is alert and oriented to person, place, and time.  Skin: Skin is warm and dry.  Psychiatric: He has a normal mood and affect. His behavior is normal.          Assessment & Plan:  COPD exacerbation-he completed his steroids about a week ago and is doing somewhat better. He thinks he's back at his baseline. He is not wearing his oxygen while here today but his oxygen level is 94%. He is not on any antibiotics currently either.  Congestive heart failure-he has significant lower extremity swelling today. I will increase his Lasix to 40 mg daily. Encouraged him to continue to weigh himself daily as well as did take an extra dose if his weight is up 2 or more lbs in a day.    Diarrhea-offered to refer him to GI as he has had negative stool cultures and C. difficile testing. He declined and says he wants to see if it gets better over the weekend.

## 2013-06-08 NOTE — Patient Instructions (Signed)
I will increase your Lasix to 40mg  once a day to help with the fluid Continue to weigh yourself daily If your weight is up 2 or more lbs in one day then you need to take an extra Lasix.

## 2013-06-11 DIAGNOSIS — J449 Chronic obstructive pulmonary disease, unspecified: Secondary | ICD-10-CM | POA: Diagnosis not present

## 2013-06-11 DIAGNOSIS — R0602 Shortness of breath: Secondary | ICD-10-CM | POA: Diagnosis not present

## 2013-06-13 ENCOUNTER — Telehealth: Payer: Self-pay | Admitting: *Deleted

## 2013-06-13 DIAGNOSIS — I10 Essential (primary) hypertension: Secondary | ICD-10-CM | POA: Diagnosis not present

## 2013-06-13 DIAGNOSIS — I4891 Unspecified atrial fibrillation: Secondary | ICD-10-CM | POA: Diagnosis not present

## 2013-06-13 DIAGNOSIS — I509 Heart failure, unspecified: Secondary | ICD-10-CM | POA: Diagnosis not present

## 2013-06-13 DIAGNOSIS — K219 Gastro-esophageal reflux disease without esophagitis: Secondary | ICD-10-CM | POA: Diagnosis not present

## 2013-06-13 DIAGNOSIS — J189 Pneumonia, unspecified organism: Secondary | ICD-10-CM | POA: Diagnosis not present

## 2013-06-13 DIAGNOSIS — J441 Chronic obstructive pulmonary disease with (acute) exacerbation: Secondary | ICD-10-CM | POA: Diagnosis not present

## 2013-06-13 NOTE — Telephone Encounter (Signed)
She states she is seeing the pt and that his wt today is 161.6. She is asking what range would you like for him to be in. She also states he has 2-3 pitting edema in LE and is [facility] which I believe is normal for him. She is trying to help maintain things at home so that he doesn't go to ED as much. Please advise.  Meyer Cory, LPN

## 2013-06-13 NOTE — Telephone Encounter (Signed)
Goal weight is 158.  He is supposed to take an extra dose if his weight is up 2 or more lbs in a day. So since he is 161, I would have him take an extra dose for the next couple days until his weight is back down to 158 pounds.

## 2013-06-13 NOTE — Telephone Encounter (Signed)
LMOM with response.  Jared Abrigo, LPN  

## 2013-06-18 DIAGNOSIS — J309 Allergic rhinitis, unspecified: Secondary | ICD-10-CM | POA: Diagnosis not present

## 2013-06-18 DIAGNOSIS — R0602 Shortness of breath: Secondary | ICD-10-CM | POA: Diagnosis not present

## 2013-06-18 DIAGNOSIS — J449 Chronic obstructive pulmonary disease, unspecified: Secondary | ICD-10-CM | POA: Diagnosis not present

## 2013-06-20 DIAGNOSIS — K219 Gastro-esophageal reflux disease without esophagitis: Secondary | ICD-10-CM | POA: Diagnosis not present

## 2013-06-20 DIAGNOSIS — I509 Heart failure, unspecified: Secondary | ICD-10-CM | POA: Diagnosis not present

## 2013-06-20 DIAGNOSIS — J441 Chronic obstructive pulmonary disease with (acute) exacerbation: Secondary | ICD-10-CM | POA: Diagnosis not present

## 2013-06-20 DIAGNOSIS — I10 Essential (primary) hypertension: Secondary | ICD-10-CM | POA: Diagnosis not present

## 2013-06-20 DIAGNOSIS — I4891 Unspecified atrial fibrillation: Secondary | ICD-10-CM | POA: Diagnosis not present

## 2013-06-20 DIAGNOSIS — J189 Pneumonia, unspecified organism: Secondary | ICD-10-CM | POA: Diagnosis not present

## 2013-06-21 ENCOUNTER — Telehealth: Payer: Self-pay | Admitting: *Deleted

## 2013-06-21 DIAGNOSIS — J449 Chronic obstructive pulmonary disease, unspecified: Secondary | ICD-10-CM

## 2013-06-21 NOTE — Telephone Encounter (Signed)
Pt came in asking about his meds he should take and we went over his medications. He also states that he see Dr. Eulis Foster, Pulmonology but doesn't like his attitude and would like to know if we can refer him to a differenet one. He also gave me a name of one of the hospitalist at [facility] and states he wants to know if you will call her and speak with her because she told him she would try and help him get a lung transplant. Her name is Frederik Schmidt and her number is [phone]. Please advise if I need to do anything to help.  Meyer Cory, LPN

## 2013-06-22 NOTE — Telephone Encounter (Signed)
Call patient: I will definitely get him in with a new pulmonologist. Ultimately it would be up to the pulmonologist to see if he meets the criteria, and is a candidate for a lung transplant. I would like for him to see Dr. Danise Mina in [facility].

## 2013-06-26 NOTE — Telephone Encounter (Signed)
Pt's step son informed and will inform pt.  Meyer Cory, LPN

## 2013-06-27 DIAGNOSIS — M549 Dorsalgia, unspecified: Secondary | ICD-10-CM | POA: Diagnosis not present

## 2013-06-27 DIAGNOSIS — S298XXA Other specified injuries of thorax, initial encounter: Secondary | ICD-10-CM | POA: Diagnosis not present

## 2013-06-27 DIAGNOSIS — J449 Chronic obstructive pulmonary disease, unspecified: Secondary | ICD-10-CM | POA: Diagnosis not present

## 2013-06-27 DIAGNOSIS — S3981XA Other specified injuries of abdomen, initial encounter: Secondary | ICD-10-CM | POA: Diagnosis not present

## 2013-06-27 DIAGNOSIS — Z9981 Dependence on supplemental oxygen: Secondary | ICD-10-CM | POA: Diagnosis not present

## 2013-06-27 DIAGNOSIS — S59909A Unspecified injury of unspecified elbow, initial encounter: Secondary | ICD-10-CM | POA: Diagnosis not present

## 2013-06-27 DIAGNOSIS — S0990XA Unspecified injury of head, initial encounter: Secondary | ICD-10-CM | POA: Diagnosis not present

## 2013-06-27 DIAGNOSIS — S41109A Unspecified open wound of unspecified upper arm, initial encounter: Secondary | ICD-10-CM | POA: Diagnosis not present

## 2013-06-27 DIAGNOSIS — S0993XA Unspecified injury of face, initial encounter: Secondary | ICD-10-CM | POA: Diagnosis not present

## 2013-06-27 DIAGNOSIS — I10 Essential (primary) hypertension: Secondary | ICD-10-CM | POA: Diagnosis not present

## 2013-06-27 DIAGNOSIS — S20219A Contusion of unspecified front wall of thorax, initial encounter: Secondary | ICD-10-CM | POA: Diagnosis not present

## 2013-06-27 DIAGNOSIS — IMO0002 Reserved for concepts with insufficient information to code with codable children: Secondary | ICD-10-CM | POA: Diagnosis not present

## 2013-06-27 DIAGNOSIS — Z87891 Personal history of nicotine dependence: Secondary | ICD-10-CM | POA: Diagnosis not present

## 2013-06-27 DIAGNOSIS — T148XXA Other injury of unspecified body region, initial encounter: Secondary | ICD-10-CM | POA: Diagnosis not present

## 2013-06-27 DIAGNOSIS — R079 Chest pain, unspecified: Secondary | ICD-10-CM | POA: Diagnosis not present

## 2013-06-27 DIAGNOSIS — R109 Unspecified abdominal pain: Secondary | ICD-10-CM | POA: Diagnosis not present

## 2013-06-27 DIAGNOSIS — S6990XA Unspecified injury of unspecified wrist, hand and finger(s), initial encounter: Secondary | ICD-10-CM | POA: Diagnosis not present

## 2013-06-28 ENCOUNTER — Ambulatory Visit (INDEPENDENT_AMBULATORY_CARE_PROVIDER_SITE_OTHER): Payer: Medicare Other | Admitting: Family Medicine

## 2013-06-28 ENCOUNTER — Encounter: Payer: Self-pay | Admitting: Family Medicine

## 2013-06-28 VITALS — BP 129/71 | HR 71 | Temp 97.6°F | Wt 157.0 lb

## 2013-06-28 DIAGNOSIS — S60819A Abrasion of unspecified wrist, initial encounter: Secondary | ICD-10-CM

## 2013-06-28 DIAGNOSIS — M545 Low back pain: Secondary | ICD-10-CM | POA: Diagnosis not present

## 2013-06-28 DIAGNOSIS — R7301 Impaired fasting glucose: Secondary | ICD-10-CM | POA: Diagnosis not present

## 2013-06-28 DIAGNOSIS — R32 Unspecified urinary incontinence: Secondary | ICD-10-CM | POA: Diagnosis not present

## 2013-06-28 DIAGNOSIS — J441 Chronic obstructive pulmonary disease with (acute) exacerbation: Secondary | ICD-10-CM

## 2013-06-28 DIAGNOSIS — I700 Atherosclerosis of aorta: Secondary | ICD-10-CM

## 2013-06-28 DIAGNOSIS — M25539 Pain in unspecified wrist: Secondary | ICD-10-CM

## 2013-06-28 DIAGNOSIS — S298XXA Other specified injuries of thorax, initial encounter: Secondary | ICD-10-CM | POA: Diagnosis not present

## 2013-06-28 DIAGNOSIS — S299XXA Unspecified injury of thorax, initial encounter: Secondary | ICD-10-CM

## 2013-06-28 DIAGNOSIS — R51 Headache: Secondary | ICD-10-CM

## 2013-06-28 LAB — POCT CBG (FASTING - GLUCOSE)-MANUAL ENTRY: Glucose Fasting, POC: 122 mg/dL — AB (ref 70–99)

## 2013-06-28 MED ORDER — HYDROCODONE-ACETAMINOPHEN 10-325 MG PO TABS: 1.0000 | ORAL_TABLET | Freq: Four times a day (QID) | ORAL | Status: DC | PRN
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
## 2013-06-28 NOTE — Patient Instructions (Signed)
Please keep taking her ibuprofen every 8 hours as needed. If you, it can then please let me know. I will repeat a CT scan of her head if this happens again. I am increasing her pain medication to 10/325. He can still take this every 6 hours as needed. Hopefully the stronger medication will help.

## 2013-06-30 ENCOUNTER — Other Ambulatory Visit: Payer: Self-pay | Admitting: Family Medicine

## 2013-07-02 DIAGNOSIS — M545 Low back pain: Secondary | ICD-10-CM | POA: Diagnosis not present

## 2013-07-03 ENCOUNTER — Other Ambulatory Visit: Payer: Self-pay | Admitting: *Deleted

## 2013-07-03 ENCOUNTER — Encounter: Payer: Self-pay | Admitting: Family Medicine

## 2013-07-03 ENCOUNTER — Ambulatory Visit (INDEPENDENT_AMBULATORY_CARE_PROVIDER_SITE_OTHER): Payer: Medicare Other | Admitting: Family Medicine

## 2013-07-03 VITALS — BP 125/66 | HR 104 | Wt 158.0 lb

## 2013-07-03 DIAGNOSIS — Z5189 Encounter for other specified aftercare: Secondary | ICD-10-CM

## 2013-07-03 DIAGNOSIS — J449 Chronic obstructive pulmonary disease, unspecified: Secondary | ICD-10-CM

## 2013-07-03 DIAGNOSIS — R7301 Impaired fasting glucose: Secondary | ICD-10-CM

## 2013-07-03 DIAGNOSIS — L089 Local infection of the skin and subcutaneous tissue, unspecified: Secondary | ICD-10-CM

## 2013-07-03 DIAGNOSIS — S20219D Contusion of unspecified front wall of thorax, subsequent encounter: Secondary | ICD-10-CM

## 2013-07-03 LAB — URINALYSIS, ROUTINE W REFLEX MICROSCOPIC
Bilirubin Urine: NEGATIVE
Glucose, UA: NEGATIVE mg/dL
Leukocytes, UA: NEGATIVE
Nitrite: NEGATIVE
Protein, ur: NEGATIVE mg/dL
pH: 5 (ref 5.0–8.0)

## 2013-07-03 MED ORDER — GLIPIZIDE ER 2.5 MG PO TB24: 2.5000 mg | ORAL_TABLET | Freq: Every day | ORAL | Status: DC
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
## 2013-07-04 DIAGNOSIS — K219 Gastro-esophageal reflux disease without esophagitis: Secondary | ICD-10-CM | POA: Diagnosis not present

## 2013-07-04 DIAGNOSIS — J189 Pneumonia, unspecified organism: Secondary | ICD-10-CM | POA: Diagnosis not present

## 2013-07-04 DIAGNOSIS — I509 Heart failure, unspecified: Secondary | ICD-10-CM | POA: Diagnosis not present

## 2013-07-04 DIAGNOSIS — J441 Chronic obstructive pulmonary disease with (acute) exacerbation: Secondary | ICD-10-CM | POA: Diagnosis not present

## 2013-07-04 DIAGNOSIS — I10 Essential (primary) hypertension: Secondary | ICD-10-CM | POA: Diagnosis not present

## 2013-07-04 DIAGNOSIS — I4891 Unspecified atrial fibrillation: Secondary | ICD-10-CM | POA: Diagnosis not present

## 2013-07-08 DIAGNOSIS — S298XXA Other specified injuries of thorax, initial encounter: Secondary | ICD-10-CM | POA: Diagnosis not present

## 2013-07-08 DIAGNOSIS — H9190 Unspecified hearing loss, unspecified ear: Secondary | ICD-10-CM | POA: Diagnosis present

## 2013-07-08 DIAGNOSIS — Z8249 Family history of ischemic heart disease and other diseases of the circulatory system: Secondary | ICD-10-CM | POA: Diagnosis not present

## 2013-07-08 DIAGNOSIS — E785 Hyperlipidemia, unspecified: Secondary | ICD-10-CM | POA: Diagnosis not present

## 2013-07-08 DIAGNOSIS — R0602 Shortness of breath: Secondary | ICD-10-CM | POA: Diagnosis not present

## 2013-07-08 DIAGNOSIS — E119 Type 2 diabetes mellitus without complications: Secondary | ICD-10-CM | POA: Diagnosis not present

## 2013-07-08 DIAGNOSIS — I509 Heart failure, unspecified: Secondary | ICD-10-CM | POA: Diagnosis not present

## 2013-07-08 DIAGNOSIS — J441 Chronic obstructive pulmonary disease with (acute) exacerbation: Secondary | ICD-10-CM | POA: Diagnosis not present

## 2013-07-08 DIAGNOSIS — R079 Chest pain, unspecified: Secondary | ICD-10-CM | POA: Diagnosis not present

## 2013-07-08 DIAGNOSIS — Z87891 Personal history of nicotine dependence: Secondary | ICD-10-CM | POA: Diagnosis not present

## 2013-07-08 DIAGNOSIS — I10 Essential (primary) hypertension: Secondary | ICD-10-CM | POA: Diagnosis not present

## 2013-07-08 DIAGNOSIS — I4891 Unspecified atrial fibrillation: Secondary | ICD-10-CM | POA: Diagnosis not present

## 2013-07-08 DIAGNOSIS — Z9981 Dependence on supplemental oxygen: Secondary | ICD-10-CM | POA: Diagnosis not present

## 2013-07-08 DIAGNOSIS — K219 Gastro-esophageal reflux disease without esophagitis: Secondary | ICD-10-CM | POA: Diagnosis present

## 2013-07-08 DIAGNOSIS — S20219A Contusion of unspecified front wall of thorax, initial encounter: Secondary | ICD-10-CM | POA: Diagnosis not present

## 2013-07-08 DIAGNOSIS — J9610 Chronic respiratory failure, unspecified whether with hypoxia or hypercapnia: Secondary | ICD-10-CM | POA: Diagnosis not present

## 2013-07-08 DIAGNOSIS — J438 Other emphysema: Secondary | ICD-10-CM | POA: Diagnosis not present

## 2013-07-12 ENCOUNTER — Other Ambulatory Visit: Payer: Self-pay | Admitting: Family Medicine

## 2013-07-17 ENCOUNTER — Ambulatory Visit: Payer: Medicare Other | Admitting: Pulmonary Disease

## 2013-07-19 ENCOUNTER — Telehealth: Payer: Self-pay | Admitting: Family Medicine

## 2013-07-19 ENCOUNTER — Encounter: Payer: Self-pay | Admitting: Family Medicine

## 2013-07-19 ENCOUNTER — Ambulatory Visit (INDEPENDENT_AMBULATORY_CARE_PROVIDER_SITE_OTHER): Payer: Medicare Other | Admitting: Family Medicine

## 2013-07-19 VITALS — BP 122/69 | HR 88 | Temp 97.2°F | Wt 154.0 lb

## 2013-07-19 DIAGNOSIS — R7301 Impaired fasting glucose: Secondary | ICD-10-CM

## 2013-07-19 DIAGNOSIS — J449 Chronic obstructive pulmonary disease, unspecified: Secondary | ICD-10-CM

## 2013-07-19 DIAGNOSIS — J441 Chronic obstructive pulmonary disease with (acute) exacerbation: Secondary | ICD-10-CM

## 2013-07-19 MED ORDER — IPRATROPIUM-ALBUTEROL 0.5-2.5 (3) MG/3ML IN SOLN
3.0000 mL | Freq: Once | RESPIRATORY_TRACT | Status: AC
  Administered 2013-07-19: 3 mL via RESPIRATORY_TRACT

## 2013-07-19 NOTE — Telephone Encounter (Signed)
I was notified that [facility] do not do lung transplants.  Do you want to put in a different ref for maybe [facility]????  thanks

## 2013-07-19 NOTE — Telephone Encounter (Signed)
Jared Lee has an appointment on Monday he can keep that. They can at least evaluate him to see if he thinks he is a candidate Jared they can always refer him to a center that does to transplant if needed.

## 2013-07-19 NOTE — Patient Instructions (Signed)
[facility] has your refills for your neb machine Keep you follow-up appointment with me Complete your course of antibiotics.
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
## 2013-07-23 ENCOUNTER — Other Ambulatory Visit: Payer: Self-pay | Admitting: Family Medicine

## 2013-07-23 ENCOUNTER — Other Ambulatory Visit: Payer: Self-pay | Admitting: *Deleted

## 2013-07-23 DIAGNOSIS — J441 Chronic obstructive pulmonary disease with (acute) exacerbation: Secondary | ICD-10-CM | POA: Diagnosis not present

## 2013-07-23 DIAGNOSIS — J189 Pneumonia, unspecified organism: Secondary | ICD-10-CM | POA: Diagnosis not present

## 2013-07-23 DIAGNOSIS — I10 Essential (primary) hypertension: Secondary | ICD-10-CM | POA: Diagnosis not present

## 2013-07-23 DIAGNOSIS — I509 Heart failure, unspecified: Secondary | ICD-10-CM | POA: Diagnosis not present

## 2013-07-23 DIAGNOSIS — I4891 Unspecified atrial fibrillation: Secondary | ICD-10-CM | POA: Diagnosis not present

## 2013-07-23 DIAGNOSIS — K219 Gastro-esophageal reflux disease without esophagitis: Secondary | ICD-10-CM | POA: Diagnosis not present

## 2013-07-30 ENCOUNTER — Ambulatory Visit: Payer: Medicare Other | Admitting: Pulmonary Disease

## 2013-08-09 ENCOUNTER — Encounter: Payer: Self-pay | Admitting: Family Medicine

## 2013-08-09 ENCOUNTER — Ambulatory Visit (INDEPENDENT_AMBULATORY_CARE_PROVIDER_SITE_OTHER): Payer: Medicare Other | Admitting: Family Medicine

## 2013-08-09 VITALS — BP 110/62 | HR 89 | Temp 97.6°F | Wt 154.0 lb

## 2013-08-09 DIAGNOSIS — J449 Chronic obstructive pulmonary disease, unspecified: Secondary | ICD-10-CM | POA: Diagnosis not present

## 2013-08-09 DIAGNOSIS — I10 Essential (primary) hypertension: Secondary | ICD-10-CM | POA: Diagnosis not present

## 2013-08-09 DIAGNOSIS — R7301 Impaired fasting glucose: Secondary | ICD-10-CM

## 2013-08-09 MED ORDER — LEVOFLOXACIN 500 MG PO TABS: 500.0000 mg | ORAL_TABLET | Freq: Every day | ORAL | Status: AC

## 2013-08-09 MED ORDER — IPRATROPIUM-ALBUTEROL 0.5-2.5 (3) MG/3ML IN SOLN
3.0000 mL | Freq: Once | RESPIRATORY_TRACT | Status: AC
  Administered 2013-08-09: 3 mL via RESPIRATORY_TRACT

## 2013-08-09 MED ORDER — IPRATROPIUM-ALBUTEROL 0.5-2.5 (3) MG/3ML IN SOLN: 3.0000 mL | RESPIRATORY_TRACT | Status: DC
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
## 2013-08-15 ENCOUNTER — Other Ambulatory Visit: Payer: Self-pay | Admitting: Family Medicine

## 2013-08-16 ENCOUNTER — Telehealth: Payer: Self-pay | Admitting: *Deleted

## 2013-08-16 NOTE — Telephone Encounter (Signed)
lvm asking for return call about which lisinopril pt is currently taking, and to inform that he should be taking only the 5 mg tabs. Laureen Ochs, Viann Shove

## 2013-08-17 NOTE — Telephone Encounter (Signed)
Spoke w/Jose and informed him that Arkel is only to take 5 mg tabs NOT 40 mg. He voiced understanding and will make sure that pt only takes 5 mg tabs.Loralee Pacas Desha

## 2013-08-18 DIAGNOSIS — Z8249 Family history of ischemic heart disease and other diseases of the circulatory system: Secondary | ICD-10-CM | POA: Diagnosis not present

## 2013-08-18 DIAGNOSIS — J4489 Other specified chronic obstructive pulmonary disease: Secondary | ICD-10-CM | POA: Diagnosis not present

## 2013-08-18 DIAGNOSIS — Z9981 Dependence on supplemental oxygen: Secondary | ICD-10-CM | POA: Diagnosis not present

## 2013-08-18 DIAGNOSIS — E119 Type 2 diabetes mellitus without complications: Secondary | ICD-10-CM | POA: Diagnosis present

## 2013-08-18 DIAGNOSIS — Z87891 Personal history of nicotine dependence: Secondary | ICD-10-CM | POA: Diagnosis not present

## 2013-08-18 DIAGNOSIS — K219 Gastro-esophageal reflux disease without esophagitis: Secondary | ICD-10-CM | POA: Diagnosis not present

## 2013-08-18 DIAGNOSIS — J9620 Acute and chronic respiratory failure, unspecified whether with hypoxia or hypercapnia: Secondary | ICD-10-CM | POA: Diagnosis not present

## 2013-08-18 DIAGNOSIS — E1165 Type 2 diabetes mellitus with hyperglycemia: Secondary | ICD-10-CM | POA: Diagnosis not present

## 2013-08-18 DIAGNOSIS — J449 Chronic obstructive pulmonary disease, unspecified: Secondary | ICD-10-CM | POA: Diagnosis not present

## 2013-08-18 DIAGNOSIS — E785 Hyperlipidemia, unspecified: Secondary | ICD-10-CM | POA: Diagnosis present

## 2013-08-18 DIAGNOSIS — I4891 Unspecified atrial fibrillation: Secondary | ICD-10-CM | POA: Diagnosis present

## 2013-08-18 DIAGNOSIS — I10 Essential (primary) hypertension: Secondary | ICD-10-CM | POA: Diagnosis not present

## 2013-08-18 DIAGNOSIS — R0602 Shortness of breath: Secondary | ICD-10-CM | POA: Diagnosis not present

## 2013-08-18 DIAGNOSIS — R079 Chest pain, unspecified: Secondary | ICD-10-CM | POA: Diagnosis not present

## 2013-08-18 DIAGNOSIS — Z7982 Long term (current) use of aspirin: Secondary | ICD-10-CM | POA: Diagnosis not present

## 2013-08-18 DIAGNOSIS — Z79899 Other long term (current) drug therapy: Secondary | ICD-10-CM | POA: Diagnosis not present

## 2013-08-18 DIAGNOSIS — R509 Fever, unspecified: Secondary | ICD-10-CM | POA: Diagnosis not present

## 2013-08-18 DIAGNOSIS — J441 Chronic obstructive pulmonary disease with (acute) exacerbation: Secondary | ICD-10-CM | POA: Diagnosis not present

## 2013-08-18 DIAGNOSIS — I509 Heart failure, unspecified: Secondary | ICD-10-CM | POA: Diagnosis present

## 2013-08-18 DIAGNOSIS — R5381 Other malaise: Secondary | ICD-10-CM | POA: Diagnosis not present

## 2013-08-31 ENCOUNTER — Ambulatory Visit: Payer: Medicare Other | Admitting: Family Medicine

## 2013-09-03 ENCOUNTER — Ambulatory Visit: Payer: Medicare Other | Admitting: Family Medicine

## 2013-09-06 ENCOUNTER — Ambulatory Visit: Payer: Medicare Other | Admitting: Family Medicine

## 2013-09-10 DIAGNOSIS — I4891 Unspecified atrial fibrillation: Secondary | ICD-10-CM | POA: Diagnosis not present

## 2013-09-10 DIAGNOSIS — R0602 Shortness of breath: Secondary | ICD-10-CM | POA: Diagnosis not present

## 2013-09-10 DIAGNOSIS — J4489 Other specified chronic obstructive pulmonary disease: Secondary | ICD-10-CM | POA: Diagnosis not present

## 2013-09-10 DIAGNOSIS — R059 Cough, unspecified: Secondary | ICD-10-CM | POA: Diagnosis not present

## 2013-09-10 DIAGNOSIS — R197 Diarrhea, unspecified: Secondary | ICD-10-CM | POA: Diagnosis not present

## 2013-09-10 DIAGNOSIS — E119 Type 2 diabetes mellitus without complications: Secondary | ICD-10-CM | POA: Diagnosis not present

## 2013-09-10 DIAGNOSIS — IMO0001 Reserved for inherently not codable concepts without codable children: Secondary | ICD-10-CM | POA: Diagnosis not present

## 2013-09-10 DIAGNOSIS — E785 Hyperlipidemia, unspecified: Secondary | ICD-10-CM | POA: Diagnosis present

## 2013-09-10 DIAGNOSIS — I509 Heart failure, unspecified: Secondary | ICD-10-CM | POA: Diagnosis not present

## 2013-09-10 DIAGNOSIS — K219 Gastro-esophageal reflux disease without esophagitis: Secondary | ICD-10-CM | POA: Diagnosis present

## 2013-09-10 DIAGNOSIS — I471 Supraventricular tachycardia: Secondary | ICD-10-CM | POA: Diagnosis not present

## 2013-09-10 DIAGNOSIS — N179 Acute kidney failure, unspecified: Secondary | ICD-10-CM | POA: Diagnosis not present

## 2013-09-10 DIAGNOSIS — R05 Cough: Secondary | ICD-10-CM | POA: Diagnosis not present

## 2013-09-10 DIAGNOSIS — Z79899 Other long term (current) drug therapy: Secondary | ICD-10-CM | POA: Diagnosis not present

## 2013-09-10 DIAGNOSIS — Z9981 Dependence on supplemental oxygen: Secondary | ICD-10-CM | POA: Diagnosis not present

## 2013-09-10 DIAGNOSIS — R0609 Other forms of dyspnea: Secondary | ICD-10-CM | POA: Diagnosis not present

## 2013-09-10 DIAGNOSIS — I10 Essential (primary) hypertension: Secondary | ICD-10-CM | POA: Diagnosis not present

## 2013-09-10 DIAGNOSIS — J441 Chronic obstructive pulmonary disease with (acute) exacerbation: Secondary | ICD-10-CM | POA: Diagnosis not present

## 2013-09-10 DIAGNOSIS — Z87891 Personal history of nicotine dependence: Secondary | ICD-10-CM | POA: Diagnosis not present

## 2013-09-10 DIAGNOSIS — F411 Generalized anxiety disorder: Secondary | ICD-10-CM | POA: Diagnosis present

## 2013-09-10 DIAGNOSIS — J449 Chronic obstructive pulmonary disease, unspecified: Secondary | ICD-10-CM | POA: Diagnosis not present

## 2013-09-15 ENCOUNTER — Other Ambulatory Visit: Payer: Self-pay | Admitting: Family Medicine

## 2013-09-17 DIAGNOSIS — E785 Hyperlipidemia, unspecified: Secondary | ICD-10-CM | POA: Diagnosis not present

## 2013-09-17 DIAGNOSIS — I509 Heart failure, unspecified: Secondary | ICD-10-CM | POA: Diagnosis not present

## 2013-09-17 DIAGNOSIS — K219 Gastro-esophageal reflux disease without esophagitis: Secondary | ICD-10-CM | POA: Diagnosis not present

## 2013-09-17 DIAGNOSIS — E119 Type 2 diabetes mellitus without complications: Secondary | ICD-10-CM | POA: Diagnosis not present

## 2013-09-17 DIAGNOSIS — I4891 Unspecified atrial fibrillation: Secondary | ICD-10-CM | POA: Diagnosis not present

## 2013-09-17 DIAGNOSIS — J441 Chronic obstructive pulmonary disease with (acute) exacerbation: Secondary | ICD-10-CM | POA: Diagnosis not present

## 2013-09-17 DIAGNOSIS — I10 Essential (primary) hypertension: Secondary | ICD-10-CM | POA: Diagnosis not present

## 2013-09-20 ENCOUNTER — Ambulatory Visit (INDEPENDENT_AMBULATORY_CARE_PROVIDER_SITE_OTHER): Payer: Medicare Other | Admitting: Family Medicine

## 2013-09-20 ENCOUNTER — Encounter: Payer: Self-pay | Admitting: Family Medicine

## 2013-09-20 VITALS — BP 116/68 | HR 70 | Temp 97.7°F | Wt 147.0 lb

## 2013-09-20 DIAGNOSIS — I10 Essential (primary) hypertension: Secondary | ICD-10-CM

## 2013-09-20 DIAGNOSIS — J441 Chronic obstructive pulmonary disease with (acute) exacerbation: Secondary | ICD-10-CM | POA: Diagnosis not present

## 2013-09-20 DIAGNOSIS — E119 Type 2 diabetes mellitus without complications: Secondary | ICD-10-CM | POA: Diagnosis not present

## 2013-09-20 LAB — POCT GLYCOSYLATED HEMOGLOBIN (HGB A1C): HEMOGLOBIN A1C: 6.2

## 2013-09-20 MED ORDER — BLOOD GLUCOSE MONITOR SYSTEM W/DEVICE KIT: PACK | Status: DC

## 2013-09-20 MED ORDER — ALBUTEROL SULFATE (2.5 MG/3ML) 0.083% IN NEBU
2.5000 mg | INHALATION_SOLUTION | Freq: Once | RESPIRATORY_TRACT | Status: AC
  Administered 2013-09-20: 2.5 mg via RESPIRATORY_TRACT

## 2013-09-20 NOTE — Progress Notes (Signed)
   Subjective:    Patient ID: Jared Lee, male    DOB: [birth year], 73 y.o.   MRN: [id number]  HPI Diabetes - One  hypoglycemic events when in the hospital at [facility]. No wounds or sores that are not healing well. No increased thirst or urination. Checking glucose at home. Taking medications as prescribed without any side effects.  Lab Results  Component Value Date   HGBA1C 6.2 09/20/2013    F/U COPD exacerbation - he says he is getting very little sputum production. He did complete his antibiotics. He was admitted to [facility] on January 12. Unfortunately there is not a discharge summary available but there is a note from January 16. So I think that that may have been the day he was discharged. He was treated with albuterol and Atrovent nebs as well as ceftriaxone azithromycin and Solu-Medrol. He was discharged home on antibiotics and says he did complete those. He is been using his albuterol scheduled every 4 hours. He is not wearing his oxygen here in the office today. He says coming off the Advair has made a huge difference in his ankle swelling. He did have a Foley catheter in place while in the hospital.  Hypertension - no chest pain. He does have shortness of breath at his baseline. Taking meds with no S.E.   Review of Systems     Objective:   Physical Exam  Constitutional: He is oriented to person, place, and time. He appears well-developed and well-nourished.  HENT:  Head: Normocephalic and atraumatic.  Cardiovascular: Normal rate, regular rhythm and normal heart sounds.   Pulmonary/Chest: Effort normal and breath sounds normal.  Neurological: He is alert and oriented to person, place, and time.  Skin: Skin is warm and dry.  Psychiatric: He has a normal mood and affect. His behavior is normal.          Assessment & Plan:  Diabetes-well-controlled. Continue glipizide. He was intolerant to metformin because of the muscles and diarrhea. We'll do  monofilament exam today. He was unable to give a urine sample for urine microalbumin. Recommend a yearly eye exam. On statin, ACE, ASA.     Hypertension-well-controlled.  Continue current regimen.    COPD exacerbation-I. do think he is back to baseline today. Pulse ox is 89% off of oxygen today. Lung exam is at his baseline with no significant wheezing or rhonchi.  Diarrhea-resolved. He evidently test is negative for C. difficile at the hospital.

## 2013-09-21 ENCOUNTER — Other Ambulatory Visit: Payer: Self-pay | Admitting: *Deleted

## 2013-09-21 MED ORDER — AMBULATORY NON FORMULARY MEDICATION: Status: DC

## 2013-09-23 DIAGNOSIS — J441 Chronic obstructive pulmonary disease with (acute) exacerbation: Secondary | ICD-10-CM | POA: Diagnosis not present

## 2013-09-23 DIAGNOSIS — K219 Gastro-esophageal reflux disease without esophagitis: Secondary | ICD-10-CM | POA: Diagnosis not present

## 2013-09-23 DIAGNOSIS — E119 Type 2 diabetes mellitus without complications: Secondary | ICD-10-CM | POA: Diagnosis not present

## 2013-09-23 DIAGNOSIS — I4891 Unspecified atrial fibrillation: Secondary | ICD-10-CM | POA: Diagnosis not present

## 2013-09-23 DIAGNOSIS — I509 Heart failure, unspecified: Secondary | ICD-10-CM | POA: Diagnosis not present

## 2013-09-23 DIAGNOSIS — I10 Essential (primary) hypertension: Secondary | ICD-10-CM | POA: Diagnosis not present

## 2013-09-26 ENCOUNTER — Other Ambulatory Visit: Payer: Self-pay | Admitting: *Deleted

## 2013-09-26 DIAGNOSIS — E119 Type 2 diabetes mellitus without complications: Secondary | ICD-10-CM

## 2013-09-26 DIAGNOSIS — J441 Chronic obstructive pulmonary disease with (acute) exacerbation: Secondary | ICD-10-CM | POA: Diagnosis not present

## 2013-09-26 DIAGNOSIS — I509 Heart failure, unspecified: Secondary | ICD-10-CM | POA: Diagnosis not present

## 2013-09-26 DIAGNOSIS — I10 Essential (primary) hypertension: Secondary | ICD-10-CM | POA: Diagnosis not present

## 2013-09-26 DIAGNOSIS — K219 Gastro-esophageal reflux disease without esophagitis: Secondary | ICD-10-CM | POA: Diagnosis not present

## 2013-09-26 DIAGNOSIS — I4891 Unspecified atrial fibrillation: Secondary | ICD-10-CM | POA: Diagnosis not present

## 2013-09-26 MED ORDER — BLOOD GLUCOSE MONITOR SYSTEM W/DEVICE KIT: PACK | Status: DC

## 2013-09-26 MED ORDER — ONETOUCH ULTRA 2 W/DEVICE KIT: PACK | Status: DC

## 2013-10-01 ENCOUNTER — Other Ambulatory Visit: Payer: Self-pay | Admitting: *Deleted

## 2013-10-01 MED ORDER — ALPRAZOLAM 0.25 MG PO TABS: 0.2500 mg | ORAL_TABLET | Freq: Every day | ORAL | Status: DC | PRN

## 2013-10-03 DIAGNOSIS — I509 Heart failure, unspecified: Secondary | ICD-10-CM | POA: Diagnosis not present

## 2013-10-03 DIAGNOSIS — I4891 Unspecified atrial fibrillation: Secondary | ICD-10-CM | POA: Diagnosis not present

## 2013-10-03 DIAGNOSIS — E119 Type 2 diabetes mellitus without complications: Secondary | ICD-10-CM | POA: Diagnosis not present

## 2013-10-03 DIAGNOSIS — J441 Chronic obstructive pulmonary disease with (acute) exacerbation: Secondary | ICD-10-CM | POA: Diagnosis not present

## 2013-10-03 DIAGNOSIS — K219 Gastro-esophageal reflux disease without esophagitis: Secondary | ICD-10-CM | POA: Diagnosis not present

## 2013-10-03 DIAGNOSIS — I10 Essential (primary) hypertension: Secondary | ICD-10-CM | POA: Diagnosis not present

## 2013-10-10 DIAGNOSIS — I4891 Unspecified atrial fibrillation: Secondary | ICD-10-CM | POA: Diagnosis not present

## 2013-10-10 DIAGNOSIS — E119 Type 2 diabetes mellitus without complications: Secondary | ICD-10-CM | POA: Diagnosis not present

## 2013-10-10 DIAGNOSIS — K219 Gastro-esophageal reflux disease without esophagitis: Secondary | ICD-10-CM | POA: Diagnosis not present

## 2013-10-10 DIAGNOSIS — I509 Heart failure, unspecified: Secondary | ICD-10-CM | POA: Diagnosis not present

## 2013-10-10 DIAGNOSIS — I10 Essential (primary) hypertension: Secondary | ICD-10-CM | POA: Diagnosis not present

## 2013-10-10 DIAGNOSIS — J441 Chronic obstructive pulmonary disease with (acute) exacerbation: Secondary | ICD-10-CM | POA: Diagnosis not present

## 2013-10-13 DIAGNOSIS — I4891 Unspecified atrial fibrillation: Secondary | ICD-10-CM | POA: Diagnosis present

## 2013-10-13 DIAGNOSIS — I471 Supraventricular tachycardia: Secondary | ICD-10-CM | POA: Diagnosis not present

## 2013-10-13 DIAGNOSIS — E785 Hyperlipidemia, unspecified: Secondary | ICD-10-CM | POA: Diagnosis not present

## 2013-10-13 DIAGNOSIS — R5381 Other malaise: Secondary | ICD-10-CM | POA: Diagnosis present

## 2013-10-13 DIAGNOSIS — J441 Chronic obstructive pulmonary disease with (acute) exacerbation: Secondary | ICD-10-CM | POA: Diagnosis not present

## 2013-10-13 DIAGNOSIS — Z79899 Other long term (current) drug therapy: Secondary | ICD-10-CM | POA: Diagnosis not present

## 2013-10-13 DIAGNOSIS — Z87891 Personal history of nicotine dependence: Secondary | ICD-10-CM | POA: Diagnosis not present

## 2013-10-13 DIAGNOSIS — K219 Gastro-esophageal reflux disease without esophagitis: Secondary | ICD-10-CM | POA: Diagnosis present

## 2013-10-13 DIAGNOSIS — R079 Chest pain, unspecified: Secondary | ICD-10-CM | POA: Diagnosis not present

## 2013-10-13 DIAGNOSIS — J9610 Chronic respiratory failure, unspecified whether with hypoxia or hypercapnia: Secondary | ICD-10-CM | POA: Diagnosis not present

## 2013-10-13 DIAGNOSIS — Z8249 Family history of ischemic heart disease and other diseases of the circulatory system: Secondary | ICD-10-CM | POA: Diagnosis not present

## 2013-10-13 DIAGNOSIS — E119 Type 2 diabetes mellitus without complications: Secondary | ICD-10-CM | POA: Diagnosis present

## 2013-10-13 DIAGNOSIS — R0602 Shortness of breath: Secondary | ICD-10-CM | POA: Diagnosis not present

## 2013-10-13 DIAGNOSIS — J984 Other disorders of lung: Secondary | ICD-10-CM | POA: Diagnosis not present

## 2013-10-13 DIAGNOSIS — Z7982 Long term (current) use of aspirin: Secondary | ICD-10-CM | POA: Diagnosis not present

## 2013-10-13 DIAGNOSIS — I509 Heart failure, unspecified: Secondary | ICD-10-CM | POA: Diagnosis not present

## 2013-10-13 DIAGNOSIS — Z9981 Dependence on supplemental oxygen: Secondary | ICD-10-CM | POA: Diagnosis not present

## 2013-10-13 DIAGNOSIS — I10 Essential (primary) hypertension: Secondary | ICD-10-CM | POA: Diagnosis not present

## 2013-10-18 DIAGNOSIS — J441 Chronic obstructive pulmonary disease with (acute) exacerbation: Secondary | ICD-10-CM | POA: Diagnosis not present

## 2013-10-18 DIAGNOSIS — K219 Gastro-esophageal reflux disease without esophagitis: Secondary | ICD-10-CM | POA: Diagnosis not present

## 2013-10-18 DIAGNOSIS — I10 Essential (primary) hypertension: Secondary | ICD-10-CM | POA: Diagnosis not present

## 2013-10-18 DIAGNOSIS — E119 Type 2 diabetes mellitus without complications: Secondary | ICD-10-CM | POA: Diagnosis not present

## 2013-10-18 DIAGNOSIS — I509 Heart failure, unspecified: Secondary | ICD-10-CM | POA: Diagnosis not present

## 2013-10-18 DIAGNOSIS — I4891 Unspecified atrial fibrillation: Secondary | ICD-10-CM | POA: Diagnosis not present

## 2013-10-22 DIAGNOSIS — H35039 Hypertensive retinopathy, unspecified eye: Secondary | ICD-10-CM | POA: Diagnosis not present

## 2013-10-22 DIAGNOSIS — I10 Essential (primary) hypertension: Secondary | ICD-10-CM | POA: Diagnosis not present

## 2013-10-23 DIAGNOSIS — I4891 Unspecified atrial fibrillation: Secondary | ICD-10-CM | POA: Diagnosis not present

## 2013-10-23 DIAGNOSIS — K219 Gastro-esophageal reflux disease without esophagitis: Secondary | ICD-10-CM | POA: Diagnosis not present

## 2013-10-23 DIAGNOSIS — E119 Type 2 diabetes mellitus without complications: Secondary | ICD-10-CM | POA: Diagnosis not present

## 2013-10-23 DIAGNOSIS — I10 Essential (primary) hypertension: Secondary | ICD-10-CM | POA: Diagnosis not present

## 2013-10-23 DIAGNOSIS — I509 Heart failure, unspecified: Secondary | ICD-10-CM | POA: Diagnosis not present

## 2013-10-23 DIAGNOSIS — J441 Chronic obstructive pulmonary disease with (acute) exacerbation: Secondary | ICD-10-CM | POA: Diagnosis not present

## 2013-10-25 ENCOUNTER — Ambulatory Visit: Payer: Medicare Other | Admitting: Family Medicine

## 2013-10-26 DIAGNOSIS — E119 Type 2 diabetes mellitus without complications: Secondary | ICD-10-CM | POA: Diagnosis not present

## 2013-10-26 DIAGNOSIS — I10 Essential (primary) hypertension: Secondary | ICD-10-CM | POA: Diagnosis not present

## 2013-10-26 DIAGNOSIS — K219 Gastro-esophageal reflux disease without esophagitis: Secondary | ICD-10-CM | POA: Diagnosis not present

## 2013-10-26 DIAGNOSIS — J441 Chronic obstructive pulmonary disease with (acute) exacerbation: Secondary | ICD-10-CM | POA: Diagnosis not present

## 2013-10-26 DIAGNOSIS — I509 Heart failure, unspecified: Secondary | ICD-10-CM | POA: Diagnosis not present

## 2013-10-26 DIAGNOSIS — I4891 Unspecified atrial fibrillation: Secondary | ICD-10-CM | POA: Diagnosis not present

## 2013-10-27 DIAGNOSIS — I10 Essential (primary) hypertension: Secondary | ICD-10-CM | POA: Diagnosis not present

## 2013-10-27 DIAGNOSIS — I509 Heart failure, unspecified: Secondary | ICD-10-CM | POA: Diagnosis not present

## 2013-10-27 DIAGNOSIS — E119 Type 2 diabetes mellitus without complications: Secondary | ICD-10-CM | POA: Diagnosis not present

## 2013-10-27 DIAGNOSIS — K219 Gastro-esophageal reflux disease without esophagitis: Secondary | ICD-10-CM | POA: Diagnosis not present

## 2013-10-27 DIAGNOSIS — J441 Chronic obstructive pulmonary disease with (acute) exacerbation: Secondary | ICD-10-CM | POA: Diagnosis not present

## 2013-10-27 DIAGNOSIS — I4891 Unspecified atrial fibrillation: Secondary | ICD-10-CM | POA: Diagnosis not present

## 2013-10-29 ENCOUNTER — Ambulatory Visit (INDEPENDENT_AMBULATORY_CARE_PROVIDER_SITE_OTHER): Payer: Medicare Other | Admitting: Family Medicine

## 2013-10-29 ENCOUNTER — Encounter: Payer: Self-pay | Admitting: Family Medicine

## 2013-10-29 VITALS — BP 117/64 | HR 82 | Temp 97.8°F | Wt 149.0 lb

## 2013-10-29 DIAGNOSIS — Z09 Encounter for follow-up examination after completed treatment for conditions other than malignant neoplasm: Secondary | ICD-10-CM | POA: Diagnosis not present

## 2013-10-29 DIAGNOSIS — J449 Chronic obstructive pulmonary disease, unspecified: Secondary | ICD-10-CM | POA: Diagnosis not present

## 2013-10-29 MED ORDER — FLUTICASONE FUROATE-VILANTEROL 100-25 MCG/INH IN AEPB: INHALATION_SPRAY | RESPIRATORY_TRACT | Status: DC

## 2013-10-29 NOTE — Progress Notes (Signed)
CC: Jared Lee is a 73 y.o. male is here for hospital f/u   Subjective: HPI:  Hospital followup admitted [facility] February 14 discharge February 16 for COPD exacerbation. Workup included negative troponins x3, normal hemoglobin and white count on time of admission, unremarkable EKGs during his stay, metabolic panel showing mild hyperglycemia otherwise unremarkable. He received IV steroids, azithromycin and albuterol with Atrovent. He was discharged on azithromycin which he is now finished a prednisone taper which has now finished. He states that since she's been home he feels much better from a breathing and energy standpoint. He continues to use his oxygen at home for what he estimates his 23 hours of the day. He wears it to sleep he is a nonsmoker.  Home health is coming to manage medications and physical therapy. He is independent in all activities of daily living.  Since returning home he is using 2 puffs of albuterol every 5 hours.  Denies fevers, chills, wheezing, chest pain, confusion, weakness, motor or sensory disturbances nor irregular heartbeat. Chronic medications were not adjusted during his hospitalization.  Direct questioning he is unsure why he is not taking any inhaled corticosteroids. On chart review it appears that Advair was causing him subjective worsening of his cough a little under a year ago.   Review Of Systems Outlined In HPI  Past Medical History  Diagnosis Date  . Hypertension   . H/O colonoscopy 02/03/12    EGD as well. Mildy nodular gastritis.    Marland Kitchen COPD (chronic obstructive pulmonary disease)   . Normal cardiac stress test 04/14/12    High POint [facility]ional  . Hyperlipemia     No past surgical history on file. Family History  Problem Relation Age of Onset  . Heart disease Father   . Hypertension Father   . Emphysema Father   . Emphysema Paternal Grandfather     History   Social History  . Marital Status: Single    Spouse Name: N/A    Number of  Children: N/A  . Years of Education: N/A   Occupational History  . Not on file.   Social History Main Topics  . Smoking status: Former Smoker -- 1.00 packs/day for 66 years    Types: Cigarettes    Quit date: 08/31/2011  . Smokeless tobacco: Never Used  . Alcohol Use: No  . Drug Use: No  . Sexual Activity: Not on file   Other Topics Concern  . Not on file   Social History Narrative  . No narrative on file     Objective: BP 117/64  Pulse 82  Temp(Src) 97.8 F (36.6 C) (Oral)  Wt 149 lb (67.586 kg)  SpO2 94%  General: Alert and Oriented, No Acute Distress HEENT: Pupils equal, round, reactive to light. Conjunctivae clear.  External ears unremarkable, canals clear with intact TMs with appropriate landmarks.  Middle ear appears open without effusion. Pink inferior turbinates.  Moist mucous membranes, pharynx without inflammation nor lesions.  Neck supple without palpable lymphadenopathy nor abnormal masses. Lungs: Distant breath sounds all lung fields no wheezing rhonchi or rales. Cardiac: Regular rate and rhythm. Normal S1/S2.  No murmurs, rubs, nor gallops.   Extremities: No peripheral edema.  Strong peripheral pulses.  Mental Status: No depression, anxiety, nor agitation. Skin: Warm and dry.  Assessment & Plan: Jared Lee was seen today for hospital f/u.  Diagnoses and associated orders for this visit:  [facility] discharge follow-up  Other Orders - Fluticasone Furoate-Vilanterol (BREO ELLIPTA) 100-25  MCG/INH AEPB; One inhalation daily to prevent COPD symptoms    COPD: Based on chart review it appears he is back to his baseline, he is also recovering as predicted been on azithromycin and a prednisone taper. He expresses interest in trying a new inhaled corticosteroid,Breo, he was given one month month of samples and encouraged followup with PCP prior to running out of this medication.  40 minutes spent face-to-face during visit today of which at least 50% was  counseling or coordinating care regarding: 1. COPD   2. Hospital discharge follow-up       Return in about 4 weeks (around 11/26/2013) for COPD FU with Dr. Jerilynn Mages PCP.

## 2013-10-30 DIAGNOSIS — I509 Heart failure, unspecified: Secondary | ICD-10-CM | POA: Diagnosis not present

## 2013-10-30 DIAGNOSIS — K219 Gastro-esophageal reflux disease without esophagitis: Secondary | ICD-10-CM | POA: Diagnosis not present

## 2013-10-30 DIAGNOSIS — E119 Type 2 diabetes mellitus without complications: Secondary | ICD-10-CM | POA: Diagnosis not present

## 2013-10-30 DIAGNOSIS — I4891 Unspecified atrial fibrillation: Secondary | ICD-10-CM | POA: Diagnosis not present

## 2013-10-30 DIAGNOSIS — I10 Essential (primary) hypertension: Secondary | ICD-10-CM | POA: Diagnosis not present

## 2013-10-30 DIAGNOSIS — J441 Chronic obstructive pulmonary disease with (acute) exacerbation: Secondary | ICD-10-CM | POA: Diagnosis not present

## 2013-10-31 DIAGNOSIS — I509 Heart failure, unspecified: Secondary | ICD-10-CM | POA: Diagnosis not present

## 2013-10-31 DIAGNOSIS — J441 Chronic obstructive pulmonary disease with (acute) exacerbation: Secondary | ICD-10-CM | POA: Diagnosis not present

## 2013-10-31 DIAGNOSIS — I4891 Unspecified atrial fibrillation: Secondary | ICD-10-CM | POA: Diagnosis not present

## 2013-10-31 DIAGNOSIS — E119 Type 2 diabetes mellitus without complications: Secondary | ICD-10-CM | POA: Diagnosis not present

## 2013-10-31 DIAGNOSIS — K219 Gastro-esophageal reflux disease without esophagitis: Secondary | ICD-10-CM | POA: Diagnosis not present

## 2013-10-31 DIAGNOSIS — I10 Essential (primary) hypertension: Secondary | ICD-10-CM | POA: Diagnosis not present

## 2013-11-01 ENCOUNTER — Other Ambulatory Visit: Payer: Self-pay | Admitting: Family Medicine

## 2013-11-01 DIAGNOSIS — E119 Type 2 diabetes mellitus without complications: Secondary | ICD-10-CM | POA: Diagnosis not present

## 2013-11-01 DIAGNOSIS — K219 Gastro-esophageal reflux disease without esophagitis: Secondary | ICD-10-CM | POA: Diagnosis not present

## 2013-11-01 DIAGNOSIS — I10 Essential (primary) hypertension: Secondary | ICD-10-CM | POA: Diagnosis not present

## 2013-11-01 DIAGNOSIS — I509 Heart failure, unspecified: Secondary | ICD-10-CM | POA: Diagnosis not present

## 2013-11-01 DIAGNOSIS — I4891 Unspecified atrial fibrillation: Secondary | ICD-10-CM | POA: Diagnosis not present

## 2013-11-01 DIAGNOSIS — J441 Chronic obstructive pulmonary disease with (acute) exacerbation: Secondary | ICD-10-CM | POA: Diagnosis not present

## 2013-11-02 ENCOUNTER — Other Ambulatory Visit: Payer: Self-pay | Admitting: Family Medicine

## 2013-11-07 DIAGNOSIS — I4891 Unspecified atrial fibrillation: Secondary | ICD-10-CM | POA: Diagnosis not present

## 2013-11-07 DIAGNOSIS — E119 Type 2 diabetes mellitus without complications: Secondary | ICD-10-CM | POA: Diagnosis not present

## 2013-11-07 DIAGNOSIS — J441 Chronic obstructive pulmonary disease with (acute) exacerbation: Secondary | ICD-10-CM | POA: Diagnosis not present

## 2013-11-07 DIAGNOSIS — K219 Gastro-esophageal reflux disease without esophagitis: Secondary | ICD-10-CM | POA: Diagnosis not present

## 2013-11-07 DIAGNOSIS — I509 Heart failure, unspecified: Secondary | ICD-10-CM | POA: Diagnosis not present

## 2013-11-07 DIAGNOSIS — I10 Essential (primary) hypertension: Secondary | ICD-10-CM | POA: Diagnosis not present

## 2013-11-08 ENCOUNTER — Other Ambulatory Visit: Payer: Self-pay | Admitting: Family Medicine

## 2013-11-13 DIAGNOSIS — I10 Essential (primary) hypertension: Secondary | ICD-10-CM | POA: Diagnosis not present

## 2013-11-13 DIAGNOSIS — I509 Heart failure, unspecified: Secondary | ICD-10-CM | POA: Diagnosis not present

## 2013-11-13 DIAGNOSIS — J441 Chronic obstructive pulmonary disease with (acute) exacerbation: Secondary | ICD-10-CM | POA: Diagnosis not present

## 2013-11-13 DIAGNOSIS — I4891 Unspecified atrial fibrillation: Secondary | ICD-10-CM | POA: Diagnosis not present

## 2013-11-13 DIAGNOSIS — E119 Type 2 diabetes mellitus without complications: Secondary | ICD-10-CM | POA: Diagnosis not present

## 2013-11-13 DIAGNOSIS — K219 Gastro-esophageal reflux disease without esophagitis: Secondary | ICD-10-CM | POA: Diagnosis not present

## 2013-11-26 ENCOUNTER — Encounter: Payer: Self-pay | Admitting: Family Medicine

## 2013-11-26 ENCOUNTER — Ambulatory Visit (INDEPENDENT_AMBULATORY_CARE_PROVIDER_SITE_OTHER): Payer: Medicare Other | Admitting: Family Medicine

## 2013-11-26 VITALS — BP 98/60 | HR 78 | Wt 146.0 lb

## 2013-11-26 DIAGNOSIS — I10 Essential (primary) hypertension: Secondary | ICD-10-CM

## 2013-11-26 DIAGNOSIS — J441 Chronic obstructive pulmonary disease with (acute) exacerbation: Secondary | ICD-10-CM

## 2013-11-26 DIAGNOSIS — E119 Type 2 diabetes mellitus without complications: Secondary | ICD-10-CM

## 2013-11-26 MED ORDER — FUROSEMIDE 40 MG PO TABS: 40.0000 mg | ORAL_TABLET | Freq: Every day | ORAL | Status: DC

## 2013-11-26 MED ORDER — FLUTICASONE FUROATE-VILANTEROL 100-25 MCG/INH IN AEPB: INHALATION_SPRAY | RESPIRATORY_TRACT | Status: DC

## 2013-11-26 MED ORDER — ESOMEPRAZOLE MAGNESIUM 20 MG PO CPDR: DELAYED_RELEASE_CAPSULE | ORAL | Status: DC

## 2013-11-26 MED ORDER — LISINOPRIL 5 MG PO TABS: 5.0000 mg | ORAL_TABLET | Freq: Every day | ORAL | Status: DC

## 2013-11-26 MED ORDER — ALPRAZOLAM 0.25 MG PO TABS: ORAL_TABLET | ORAL | Status: DC

## 2013-11-26 MED ORDER — DOXYCYCLINE HYCLATE 100 MG PO TABS: 100.0000 mg | ORAL_TABLET | Freq: Two times a day (BID) | ORAL | Status: DC

## 2013-11-26 MED ORDER — METOPROLOL TARTRATE 100 MG PO TABS: ORAL_TABLET | ORAL | Status: DC

## 2013-11-26 MED ORDER — GLIPIZIDE ER 2.5 MG PO TB24: 2.5000 mg | ORAL_TABLET | Freq: Every day | ORAL | Status: DC

## 2013-11-26 NOTE — Patient Instructions (Signed)
Make sure using the [facility] Inhaler daily.  In addition to the Spiriva daily.

## 2013-11-26 NOTE — Progress Notes (Signed)
   Subjective:    Patient ID: Jared Lee, male    DOB: 1941/07/27, 73 y.o.   MRN: [id number]  HPI COPD - time for daily neb so asked if he could have one here today.  Not wearing his oxygen today. Last 3 nights have been bad.  Increase cough, sneezing, adn phlegm productiion. Low grade fever.  Increased SOB.  He was last hospitalized for COPD in the middle of February. Says has been about 8 weeks. He has not been on any antibiotic or steroids since then. He has decided not going to take any more steroids since it causes diabetes.  Review of Systems     Objective:   Physical Exam  Constitutional: He is oriented to person, place, and time. He appears well-developed and well-nourished.  HENT:  Head: Normocephalic and atraumatic.  Right Ear: External ear normal.  Left Ear: External ear normal.  Nose: Nose normal.  Mouth/Throat: Oropharynx is clear and moist.  TMs and canals are clear.   Eyes: Conjunctivae and EOM are normal. Pupils are equal, round, and reactive to light.  Neck: Neck supple. No thyromegaly present.  Cardiovascular: Normal rate and normal heart sounds.   Pulmonary/Chest: Effort normal and breath sounds normal.  Musculoskeletal: He exhibits no edema.  No LE edema   Lymphadenopathy:    He has no cervical adenopathy.  Neurological: He is alert and oriented to person, place, and time.  Skin: Skin is warm and dry.  Psychiatric: He has a normal mood and affect.          Assessment & Plan:  COPD exacerbation the last 3 days. Will treat with doxycycline.Marland Kitchen He declined to have prednisone. Continue albuterol every 2-4 hours as needed. Go to the ED if suddenly getting worse.Call if not better in 3-4 days. Make sure using [facility] It was not on medication list that he brought in.  He is using his Spiriva.  Call if decides to do steroids.   Followup in one month for diabetes. Reminded him to get his eye exam.

## 2013-11-27 DIAGNOSIS — E119 Type 2 diabetes mellitus without complications: Secondary | ICD-10-CM | POA: Diagnosis not present

## 2013-11-27 DIAGNOSIS — I10 Essential (primary) hypertension: Secondary | ICD-10-CM | POA: Diagnosis not present

## 2013-11-27 LAB — LIPID PANEL
Cholesterol: 241 mg/dL — ABNORMAL HIGH (ref 0–200)
HDL: 70 mg/dL (ref >39)
LDL CALC: 152 mg/dL — AB (ref 0–99)
Total CHOL/HDL Ratio: 3.4 Ratio
Triglycerides: 96 mg/dL (ref <150)
VLDL: 19 mg/dL (ref 0–40)

## 2013-11-27 LAB — COMPLETE METABOLIC PANEL WITH GFR
ALT: 14 U/L (ref 0–53)
AST: 21 U/L (ref 0–37)
Albumin: 4.2 g/dL (ref 3.5–5.2)
Alkaline Phosphatase: 75 U/L (ref 39–117)
BUN: 20 mg/dL (ref 6–23)
CHLORIDE: 107 meq/L (ref 96–112)
CO2: 27 meq/L (ref 19–32)
Calcium: 9.7 mg/dL (ref 8.4–10.5)
Creat: 0.82 mg/dL (ref 0.50–1.35)
GFR, Est Non African American: 87 mL/min
Glucose, Bld: 94 mg/dL (ref 70–99)
POTASSIUM: 4.4 meq/L (ref 3.5–5.3)
SODIUM: 144 meq/L (ref 135–145)
TOTAL PROTEIN: 6.6 g/dL (ref 6.0–8.3)
Total Bilirubin: 0.4 mg/dL (ref 0.2–1.2)

## 2013-11-30 DIAGNOSIS — R079 Chest pain, unspecified: Secondary | ICD-10-CM | POA: Diagnosis not present

## 2013-11-30 DIAGNOSIS — J4489 Other specified chronic obstructive pulmonary disease: Secondary | ICD-10-CM | POA: Diagnosis not present

## 2013-11-30 DIAGNOSIS — J449 Chronic obstructive pulmonary disease, unspecified: Secondary | ICD-10-CM | POA: Diagnosis not present

## 2013-11-30 DIAGNOSIS — I10 Essential (primary) hypertension: Secondary | ICD-10-CM | POA: Diagnosis not present

## 2013-11-30 DIAGNOSIS — R0902 Hypoxemia: Secondary | ICD-10-CM | POA: Diagnosis not present

## 2013-11-30 DIAGNOSIS — R51 Headache: Secondary | ICD-10-CM | POA: Diagnosis not present

## 2013-12-01 DIAGNOSIS — E119 Type 2 diabetes mellitus without complications: Secondary | ICD-10-CM | POA: Diagnosis present

## 2013-12-01 DIAGNOSIS — Z7982 Long term (current) use of aspirin: Secondary | ICD-10-CM | POA: Diagnosis not present

## 2013-12-01 DIAGNOSIS — K219 Gastro-esophageal reflux disease without esophagitis: Secondary | ICD-10-CM | POA: Diagnosis present

## 2013-12-01 DIAGNOSIS — Z87891 Personal history of nicotine dependence: Secondary | ICD-10-CM | POA: Diagnosis not present

## 2013-12-01 DIAGNOSIS — I5032 Chronic diastolic (congestive) heart failure: Secondary | ICD-10-CM | POA: Diagnosis present

## 2013-12-01 DIAGNOSIS — R079 Chest pain, unspecified: Secondary | ICD-10-CM | POA: Diagnosis not present

## 2013-12-01 DIAGNOSIS — E860 Dehydration: Secondary | ICD-10-CM | POA: Diagnosis not present

## 2013-12-01 DIAGNOSIS — Z8249 Family history of ischemic heart disease and other diseases of the circulatory system: Secondary | ICD-10-CM | POA: Diagnosis not present

## 2013-12-01 DIAGNOSIS — I10 Essential (primary) hypertension: Secondary | ICD-10-CM | POA: Diagnosis not present

## 2013-12-01 DIAGNOSIS — E785 Hyperlipidemia, unspecified: Secondary | ICD-10-CM | POA: Diagnosis present

## 2013-12-01 DIAGNOSIS — R0902 Hypoxemia: Secondary | ICD-10-CM | POA: Diagnosis not present

## 2013-12-01 DIAGNOSIS — I509 Heart failure, unspecified: Secondary | ICD-10-CM | POA: Diagnosis not present

## 2013-12-01 DIAGNOSIS — I471 Supraventricular tachycardia: Secondary | ICD-10-CM | POA: Diagnosis not present

## 2013-12-01 DIAGNOSIS — R51 Headache: Secondary | ICD-10-CM | POA: Diagnosis not present

## 2013-12-01 DIAGNOSIS — J449 Chronic obstructive pulmonary disease, unspecified: Secondary | ICD-10-CM | POA: Diagnosis not present

## 2013-12-01 DIAGNOSIS — I4891 Unspecified atrial fibrillation: Secondary | ICD-10-CM | POA: Diagnosis present

## 2013-12-01 DIAGNOSIS — E118 Type 2 diabetes mellitus with unspecified complications: Secondary | ICD-10-CM | POA: Diagnosis not present

## 2013-12-01 DIAGNOSIS — D509 Iron deficiency anemia, unspecified: Secondary | ICD-10-CM | POA: Diagnosis present

## 2013-12-01 DIAGNOSIS — Z79899 Other long term (current) drug therapy: Secondary | ICD-10-CM | POA: Diagnosis not present

## 2013-12-01 DIAGNOSIS — J9620 Acute and chronic respiratory failure, unspecified whether with hypoxia or hypercapnia: Secondary | ICD-10-CM | POA: Diagnosis not present

## 2013-12-01 DIAGNOSIS — J441 Chronic obstructive pulmonary disease with (acute) exacerbation: Secondary | ICD-10-CM | POA: Diagnosis not present

## 2013-12-20 ENCOUNTER — Ambulatory Visit (INDEPENDENT_AMBULATORY_CARE_PROVIDER_SITE_OTHER): Payer: Medicare Other | Admitting: Family Medicine

## 2013-12-20 ENCOUNTER — Encounter: Payer: Self-pay | Admitting: Family Medicine

## 2013-12-20 VITALS — BP 123/73 | HR 84

## 2013-12-20 DIAGNOSIS — I509 Heart failure, unspecified: Secondary | ICD-10-CM | POA: Diagnosis not present

## 2013-12-20 DIAGNOSIS — J209 Acute bronchitis, unspecified: Secondary | ICD-10-CM

## 2013-12-20 DIAGNOSIS — J441 Chronic obstructive pulmonary disease with (acute) exacerbation: Secondary | ICD-10-CM | POA: Diagnosis not present

## 2013-12-20 MED ORDER — ALBUTEROL SULFATE (2.5 MG/3ML) 0.083% IN NEBU
2.5000 mg | INHALATION_SOLUTION | Freq: Once | RESPIRATORY_TRACT | Status: AC
  Administered 2013-12-20: 2.5 mg via RESPIRATORY_TRACT

## 2013-12-20 NOTE — Progress Notes (Signed)
Subjective:    Patient ID: Jared Lee, male    DOB: [birth year], 73 y.o.   MRN: [id number]  HPI Here for hospital followup for [facility]. He lives recently admitted for acute COPD exacerbation. He was discharged home on April 9. He was admitted on April 4. He was discharged home on azithromycin, iron tablets and a tapering dose of prednisone. He did complete his course on all these medications. Over the last 5 days she's been feeling more short of breath and wheezing. He said he had a couple might respond very difficult to sleep because she's been short of breath. He's had a productive cough with green-yellow sputum and it had some low-grade fevers. He is requesting a referral nebulizer treatment while here.  Congestive heart failure-he says the swelling in his ankles have actually been really well controlled. Not had any recent flares. taking his medications as prescribed.  Review of Systems  BP 123/73  Pulse 84  SpO2 90%    Allergies  Allergen Reactions  . Daliresp [Roflumilast] Other (See Comments)    HA, diarrhea   . Levaquin [Levofloxacin]     Past Medical History  Diagnosis Date  . Hypertension   . H/O colonoscopy 02/03/12    EGD as well. Mildy nodular gastritis.    Marland Kitchen COPD (chronic obstructive pulmonary disease)   . Normal cardiac stress test 04/14/12    High POint REgional  . Hyperlipemia     No past surgical history on file.  History   Social History  . Marital Status: Single    Spouse Name: N/A    Number of Children: N/A  . Years of Education: N/A   Occupational History  . Not on file.   Social History Main Topics  . Smoking status: Former Smoker -- 1.00 packs/day for 66 years    Types: Cigarettes    Quit date: 08/31/2011  . Smokeless tobacco: Never Used  . Alcohol Use: No  . Drug Use: No  . Sexual Activity: Not on file   Other Topics Concern  . Not on file   Social History Narrative  . No narrative on file    Family History   Problem Relation Age of Onset  . Heart disease Father   . Hypertension Father   . Emphysema Father   . Emphysema Paternal Grandfather     Outpatient Encounter Prescriptions as of 12/20/2013  Medication Sig  . albuterol (PROAIR HFA) 108 (90 BASE) MCG/ACT inhaler INHALE TWO TO FOUR PUFFS BY MOUTH EVERY 6 HOURS AS NEEDED FOR WHEEZING AND FOR SHORTNESS OF BREATH  . albuterol (PROVENTIL) (2.5 MG/3ML) 0.083% nebulizer solution USE ONE VIAL IN NEBULIZER EVERY 6 HOURS AS NEEDED FOR WHEEZING OR SHORTNESS OF BREATH  . ALPRAZolam (XANAX) 0.25 MG tablet TAKE ONE TABLET BY MOUTH ONCE DAILY AS NEEDED FOR ANXIETY  . AMBULATORY NON FORMULARY MEDICATION Medication Name: Nebulizer machine. Dx COPD. [facility].  . AMBULATORY NON FORMULARY MEDICATION Medication Name: Ambulatory oxygen cocentrator.  Pt is mobile within th home.  Pt requests portable concentrator. Using [facility] for oxygen needs.  Marland Kitchen aspirin 81 MG tablet Take 81 mg by mouth daily.  . Blood Glucose Monitoring Suppl (ONE TOUCH ULTRA 2) W/DEVICE KIT check twice a day  . esomeprazole (NEXIUM) 20 MG capsule Take one capsule by mouth in the morning before breakfast.  . Fluticasone Furoate-Vilanterol (BREO ELLIPTA) 100-25 MCG/INH AEPB One inhalation daily to prevent COPD symptoms  . furosemide (LASIX) 40 MG tablet  Take 1 tablet (40 mg total) by mouth daily.  Marland Kitchen glipiZIDE (GLIPIZIDE XL) 2.5 MG 24 hr tablet Take 1 tablet (2.5 mg total) by mouth daily.  Marland Kitchen ibuprofen (ADVIL,MOTRIN) 600 MG tablet   . lisinopril (PRINIVIL,ZESTRIL) 5 MG tablet Take 1 tablet (5 mg total) by mouth daily.  . metoprolol (LOPRESSOR) 100 MG tablet TAKE ONE TABLET BY MOUTH TWICE DAILY  . pravastatin (PRAVACHOL) 20 MG tablet TAKE ONE TABLET BY MOUTH ONCE DAILY  . tiotropium (SPIRIVA HANDIHALER) 18 MCG inhalation capsule INHALE CONTENTS OF ONE CAPSULE BY MOUTH VIA HANDIHALER ONCE DAILY  . doxycycline (VIBRAMYCIN) 100 MG capsule Take 100 mg by mouth 2 (two) times daily.  Marland Kitchen  albuterol (PROVENTIL) (2.5 MG/3ML) 0.083% nebulizer solution 2.5 mg           Objective:   Physical Exam  Constitutional: He is oriented to person, place, and time. He appears well-developed and well-nourished.  HENT:  Head: Normocephalic and atraumatic.  Right Ear: External ear normal.  Left Ear: External ear normal.  Nose: Nose normal.  Mouth/Throat: Oropharynx is clear and moist.  TMs and canals are clear.   Eyes: Conjunctivae and EOM are normal. Pupils are equal, round, and reactive to light.  Neck: Neck supple. No thyromegaly present.  Cardiovascular: Normal rate and normal heart sounds.   Pulmonary/Chest: Effort normal and breath sounds normal.  Poor air movement and some fine wheezing at the bases bilaterally with no crackles. After nebulizer treatment much improved air movement and wheezing resolved.  Musculoskeletal: He exhibits no edema.  Lymphadenopathy:    He has no cervical adenopathy.  Neurological: He is alert and oriented to person, place, and time.  Skin: Skin is warm and dry.  Psychiatric: He has a normal mood and affect.          Assessment & Plan:  COPD exacerbation, acute on chronic-given him Neurontin as her treatment here. He definitely has increased work of breathing. He declined a Solu-Medrol injection. He really does not like taking steroids and has refused on multiple occasions. We'll put him on another round of antibiotics and he can certainly go to the emergency department if he feels like he is getting worse. No sign of volume overload as he does have a history of congestive heart failure. Given 2 nebulizer treatments in the office. Continue home albuterol nebulizer treatments every 4 hours as needed. I did encourage him to the hospital if he feels like he suddenly getting worse.  Will start doxycycline as he just completed a course of azithromycin and cannot take Levaquin. Make sure wearing oxygen at home. He is not wearing here today.  Congestive  heart failure-well-controlled. No sign of volume overload. He is currently on ACE inhibitor, beta blocker, statin.  Followup in 6 weeks for diabetic check and to recheck on his COPD.

## 2013-12-20 NOTE — Patient Instructions (Signed)
Please go to [facility] this afternoon and pick up the prescription and start antibiotic as soon as possible. Can increase her nebulizer treatments every 2-4 hours as needed. If he feels are getting worse or becoming more short of breath and please go to the emergency department.

## 2013-12-23 DIAGNOSIS — R0902 Hypoxemia: Secondary | ICD-10-CM | POA: Diagnosis not present

## 2013-12-23 DIAGNOSIS — H9190 Unspecified hearing loss, unspecified ear: Secondary | ICD-10-CM | POA: Diagnosis present

## 2013-12-23 DIAGNOSIS — E1169 Type 2 diabetes mellitus with other specified complication: Secondary | ICD-10-CM | POA: Diagnosis not present

## 2013-12-23 DIAGNOSIS — R059 Cough, unspecified: Secondary | ICD-10-CM | POA: Diagnosis not present

## 2013-12-23 DIAGNOSIS — Z7982 Long term (current) use of aspirin: Secondary | ICD-10-CM | POA: Diagnosis not present

## 2013-12-23 DIAGNOSIS — I509 Heart failure, unspecified: Secondary | ICD-10-CM | POA: Diagnosis present

## 2013-12-23 DIAGNOSIS — I5030 Unspecified diastolic (congestive) heart failure: Secondary | ICD-10-CM | POA: Diagnosis not present

## 2013-12-23 DIAGNOSIS — I10 Essential (primary) hypertension: Secondary | ICD-10-CM | POA: Diagnosis not present

## 2013-12-23 DIAGNOSIS — E119 Type 2 diabetes mellitus without complications: Secondary | ICD-10-CM | POA: Diagnosis present

## 2013-12-23 DIAGNOSIS — R0602 Shortness of breath: Secondary | ICD-10-CM | POA: Diagnosis not present

## 2013-12-23 DIAGNOSIS — E785 Hyperlipidemia, unspecified: Secondary | ICD-10-CM | POA: Diagnosis not present

## 2013-12-23 DIAGNOSIS — K219 Gastro-esophageal reflux disease without esophagitis: Secondary | ICD-10-CM | POA: Diagnosis present

## 2013-12-23 DIAGNOSIS — J9610 Chronic respiratory failure, unspecified whether with hypoxia or hypercapnia: Secondary | ICD-10-CM | POA: Diagnosis not present

## 2013-12-23 DIAGNOSIS — I471 Supraventricular tachycardia: Secondary | ICD-10-CM | POA: Diagnosis not present

## 2013-12-23 DIAGNOSIS — J441 Chronic obstructive pulmonary disease with (acute) exacerbation: Secondary | ICD-10-CM | POA: Diagnosis not present

## 2013-12-23 DIAGNOSIS — I4891 Unspecified atrial fibrillation: Secondary | ICD-10-CM | POA: Diagnosis not present

## 2013-12-23 DIAGNOSIS — R05 Cough: Secondary | ICD-10-CM | POA: Diagnosis not present

## 2013-12-23 DIAGNOSIS — J449 Chronic obstructive pulmonary disease, unspecified: Secondary | ICD-10-CM | POA: Diagnosis not present

## 2013-12-23 DIAGNOSIS — Z79899 Other long term (current) drug therapy: Secondary | ICD-10-CM | POA: Diagnosis not present

## 2013-12-23 DIAGNOSIS — Z8249 Family history of ischemic heart disease and other diseases of the circulatory system: Secondary | ICD-10-CM | POA: Diagnosis not present

## 2013-12-23 DIAGNOSIS — Z87891 Personal history of nicotine dependence: Secondary | ICD-10-CM | POA: Diagnosis not present

## 2013-12-23 DIAGNOSIS — D696 Thrombocytopenia, unspecified: Secondary | ICD-10-CM | POA: Diagnosis present

## 2013-12-23 DIAGNOSIS — J4489 Other specified chronic obstructive pulmonary disease: Secondary | ICD-10-CM | POA: Diagnosis not present

## 2014-01-03 ENCOUNTER — Telehealth: Payer: Self-pay | Admitting: *Deleted

## 2014-01-03 NOTE — Telephone Encounter (Signed)
Called and lvm for pt's son asking that at his next visit for him to bring in ALL of his medications.Jared Lee

## 2014-01-07 ENCOUNTER — Ambulatory Visit (INDEPENDENT_AMBULATORY_CARE_PROVIDER_SITE_OTHER): Payer: Medicare Other | Admitting: Family Medicine

## 2014-01-07 ENCOUNTER — Encounter: Payer: Self-pay | Admitting: Family Medicine

## 2014-01-07 ENCOUNTER — Other Ambulatory Visit: Payer: Self-pay | Admitting: *Deleted

## 2014-01-07 VITALS — BP 112/65 | HR 84 | Ht 71.0 in | Wt 146.0 lb

## 2014-01-07 DIAGNOSIS — I509 Heart failure, unspecified: Secondary | ICD-10-CM

## 2014-01-07 DIAGNOSIS — E119 Type 2 diabetes mellitus without complications: Secondary | ICD-10-CM

## 2014-01-07 DIAGNOSIS — J4489 Other specified chronic obstructive pulmonary disease: Secondary | ICD-10-CM | POA: Diagnosis not present

## 2014-01-07 DIAGNOSIS — J449 Chronic obstructive pulmonary disease, unspecified: Secondary | ICD-10-CM | POA: Diagnosis not present

## 2014-01-07 LAB — POCT UA - MICROALBUMIN
Albumin/Creatinine Ratio, Urine, POC: <30
Creatinine, POC: 50 mg/dL
Microalbumin Ur, POC: 10 mg/L

## 2014-01-07 LAB — POCT GLYCOSYLATED HEMOGLOBIN (HGB A1C): HEMOGLOBIN A1C: 5.7

## 2014-01-07 MED ORDER — ALBUTEROL SULFATE (2.5 MG/3ML) 0.083% IN NEBU: INHALATION_SOLUTION | RESPIRATORY_TRACT | Status: DC

## 2014-01-07 NOTE — Progress Notes (Signed)
Subjective:    Patient ID: Jared Lee, male    DOB: 03-Sep-1940, 73 y.o.   MRN: [id number]  HPI Here today for hospital followup for COPD exacerbation-he completed his course of antibiotics. He says he is almost back to his baseline but not quite. He did bring his oxygen with him but did not wear into the room today.  Congestive heart failure-he has not had any lower extremity edema. Feels like a swelling has been under good control since being off the prednisone. He denies any dizziness or chest pain.  We also asked him to bring in his medications as we got the notification from the pharmacy that he was on multiple doses of furosemide and lisinopril. He is actually been taking at 40 mg and 20 mg tab of furosemide. Also taking a 5 mg and 2.5 mg dose of lisinopril.   Review of Systems  BP 112/65  Pulse 84  Ht _0  (1.803 m)  Wt 146 lb (66.225 kg)  BMI 20.37 kg/m2  SpO2 90%    Allergies  Allergen Reactions  . Daliresp [Roflumilast] Other (See Comments)    HA, diarrhea   . Levaquin [Levofloxacin]   . Prednisone Other (See Comments)    Swelling    Past Medical History  Diagnosis Date  . Hypertension   . H/O colonoscopy 02/03/12    EGD as well. Mildy nodular gastritis.    Marland Kitchen COPD (chronic obstructive pulmonary disease)   . Normal cardiac stress test 04/14/12    High POint REgional  . Hyperlipemia     No past surgical history on file.  History   Social History  . Marital Status: Single    Spouse Name: N/A    Number of Children: N/A  . Years of Education: N/A   Occupational History  . Not on file.   Social History Main Topics  . Smoking status: Former Smoker -- 1.00 packs/day for 66 years    Types: Cigarettes    Quit date: 08/31/2011  . Smokeless tobacco: Never Used  . Alcohol Use: No  . Drug Use: No  . Sexual Activity: Not on file   Other Topics Concern  . Not on file   Social History Narrative  . No narrative on file    Family History  Problem  Relation Age of Onset  . Heart disease Father   . Hypertension Father   . Emphysema Father   . Emphysema Paternal Grandfather     Outpatient Encounter Prescriptions as of 01/07/2014  Medication Sig  . albuterol (PROAIR HFA) 108 (90 BASE) MCG/ACT inhaler INHALE TWO TO FOUR PUFFS BY MOUTH EVERY 6 HOURS AS NEEDED FOR WHEEZING AND FOR SHORTNESS OF BREATH  . albuterol (PROVENTIL) (2.5 MG/3ML) 0.083% nebulizer solution USE ONE VIAL IN NEBULIZER EVERY 6 HOURS AS NEEDED FOR WHEEZING OR SHORTNESS OF BREATH  . ALPRAZolam (XANAX) 0.25 MG tablet TAKE ONE TABLET BY MOUTH ONCE DAILY AS NEEDED FOR ANXIETY  . AMBULATORY NON FORMULARY MEDICATION Medication Name: Nebulizer machine. Dx COPD. [facility].  . AMBULATORY NON FORMULARY MEDICATION Medication Name: Ambulatory oxygen cocentrator.  Pt is mobile within th home.  Pt requests portable concentrator. Using [facility] for oxygen needs.  Marland Kitchen aspirin 81 MG tablet Take 81 mg by mouth daily.  . Blood Glucose Monitoring Suppl (ONE TOUCH ULTRA 2) W/DEVICE KIT check twice a day  . doxycycline (VIBRAMYCIN) 100 MG capsule Take 100 mg by mouth 2 (two) times daily.  Marland Kitchen esomeprazole (NEXIUM) 20 MG  capsule Take one capsule by mouth in the morning before breakfast.  . Fluticasone Furoate-Vilanterol (BREO ELLIPTA) 100-25 MCG/INH AEPB One inhalation daily to prevent COPD symptoms  . furosemide (LASIX) 40 MG tablet Take 1 tablet (40 mg total) by mouth daily.  Marland Kitchen glipiZIDE (GLIPIZIDE XL) 2.5 MG 24 hr tablet Take 1 tablet (2.5 mg total) by mouth daily.  Marland Kitchen ibuprofen (ADVIL,MOTRIN) 600 MG tablet   . lisinopril (PRINIVIL,ZESTRIL) 2.5 MG tablet Take 2.5 mg by mouth daily.  . metoprolol (LOPRESSOR) 100 MG tablet TAKE ONE TABLET BY MOUTH TWICE DAILY  . pravastatin (PRAVACHOL) 20 MG tablet TAKE ONE TABLET BY MOUTH ONCE DAILY  . tiotropium (SPIRIVA HANDIHALER) 18 MCG inhalation capsule INHALE CONTENTS OF ONE CAPSULE BY MOUTH VIA HANDIHALER ONCE DAILY  . [DISCONTINUED] lisinopril  (PRINIVIL,ZESTRIL) 5 MG tablet Take 1 tablet (5 mg total) by mouth daily.          Objective:   Physical Exam  Constitutional: He is oriented to person, place, and time. He appears well-developed and well-nourished.  HENT:  Head: Normocephalic and atraumatic.  Right Ear: External ear normal.  Left Ear: External ear normal.  Nose: Nose normal.  Mouth/Throat: Oropharynx is clear and moist.  TMs and canals are clear.   Eyes: Conjunctivae and EOM are normal. Pupils are equal, round, and reactive to light.  Neck: Neck supple. No thyromegaly present.  Cardiovascular: Normal rate and normal heart sounds.   Pulmonary/Chest: Effort normal and breath sounds normal.  Lymphadenopathy:    He has no cervical adenopathy.  Neurological: He is alert and oriented to person, place, and time.  Skin: Skin is warm and dry.  Psychiatric: He has a normal mood and affect.          Assessment & Plan:  COPD exacerbation-almost back to baseline on current regimen. Has completed his antibiotic. Does not want to be on steroids.  Congestive heart failure-recommend only take the 40 mg Lasix and only take the 2.5 mg of lisinopril, since his blood pressure is low. He denies any dizziness or lightheadedness or swelling.  DM- well-controlled. He A1c 5.7 today which is fantastic. Down from previous. I think is staying off the steroids has made a big difference in his blood glucose levels. He's also taking glipizide which is helping as well. He has not had any hypoglycemic events. He was able to collect a urine specimen today and we will check a urine microalbumin. Followup in 2-3 months.

## 2014-01-08 ENCOUNTER — Other Ambulatory Visit: Payer: Self-pay | Admitting: *Deleted

## 2014-01-08 MED ORDER — ALBUTEROL SULFATE (2.5 MG/3ML) 0.083% IN NEBU: INHALATION_SOLUTION | RESPIRATORY_TRACT | Status: DC

## 2014-01-10 ENCOUNTER — Other Ambulatory Visit: Payer: Self-pay | Admitting: *Deleted

## 2014-01-14 DIAGNOSIS — R109 Unspecified abdominal pain: Secondary | ICD-10-CM | POA: Diagnosis not present

## 2014-01-14 DIAGNOSIS — R197 Diarrhea, unspecified: Secondary | ICD-10-CM | POA: Diagnosis not present

## 2014-01-14 DIAGNOSIS — R1110 Vomiting, unspecified: Secondary | ICD-10-CM | POA: Diagnosis not present

## 2014-01-14 DIAGNOSIS — R112 Nausea with vomiting, unspecified: Secondary | ICD-10-CM | POA: Diagnosis not present

## 2014-01-14 DIAGNOSIS — J438 Other emphysema: Secondary | ICD-10-CM | POA: Diagnosis not present

## 2014-01-14 DIAGNOSIS — J449 Chronic obstructive pulmonary disease, unspecified: Secondary | ICD-10-CM | POA: Diagnosis not present

## 2014-01-14 DIAGNOSIS — R0602 Shortness of breath: Secondary | ICD-10-CM | POA: Diagnosis not present

## 2014-01-14 DIAGNOSIS — J441 Chronic obstructive pulmonary disease with (acute) exacerbation: Secondary | ICD-10-CM | POA: Diagnosis not present

## 2014-01-15 DIAGNOSIS — J438 Other emphysema: Secondary | ICD-10-CM | POA: Diagnosis not present

## 2014-01-22 DIAGNOSIS — R079 Chest pain, unspecified: Secondary | ICD-10-CM | POA: Diagnosis not present

## 2014-01-22 DIAGNOSIS — H9190 Unspecified hearing loss, unspecified ear: Secondary | ICD-10-CM | POA: Diagnosis present

## 2014-01-22 DIAGNOSIS — R0602 Shortness of breath: Secondary | ICD-10-CM | POA: Diagnosis not present

## 2014-01-22 DIAGNOSIS — R52 Pain, unspecified: Secondary | ICD-10-CM | POA: Diagnosis not present

## 2014-01-22 DIAGNOSIS — E119 Type 2 diabetes mellitus without complications: Secondary | ICD-10-CM | POA: Diagnosis present

## 2014-01-22 DIAGNOSIS — Z79899 Other long term (current) drug therapy: Secondary | ICD-10-CM | POA: Diagnosis not present

## 2014-01-22 DIAGNOSIS — J9610 Chronic respiratory failure, unspecified whether with hypoxia or hypercapnia: Secondary | ICD-10-CM | POA: Diagnosis present

## 2014-01-22 DIAGNOSIS — I10 Essential (primary) hypertension: Secondary | ICD-10-CM | POA: Diagnosis present

## 2014-01-22 DIAGNOSIS — K219 Gastro-esophageal reflux disease without esophagitis: Secondary | ICD-10-CM | POA: Diagnosis present

## 2014-01-22 DIAGNOSIS — I509 Heart failure, unspecified: Secondary | ICD-10-CM | POA: Diagnosis present

## 2014-01-22 DIAGNOSIS — Z9981 Dependence on supplemental oxygen: Secondary | ICD-10-CM | POA: Diagnosis not present

## 2014-01-22 DIAGNOSIS — Z87891 Personal history of nicotine dependence: Secondary | ICD-10-CM | POA: Diagnosis not present

## 2014-01-22 DIAGNOSIS — R072 Precordial pain: Secondary | ICD-10-CM | POA: Diagnosis not present

## 2014-01-22 DIAGNOSIS — J441 Chronic obstructive pulmonary disease with (acute) exacerbation: Secondary | ICD-10-CM | POA: Diagnosis not present

## 2014-01-22 DIAGNOSIS — E785 Hyperlipidemia, unspecified: Secondary | ICD-10-CM | POA: Diagnosis present

## 2014-01-22 DIAGNOSIS — Z7982 Long term (current) use of aspirin: Secondary | ICD-10-CM | POA: Diagnosis not present

## 2014-01-22 DIAGNOSIS — I4891 Unspecified atrial fibrillation: Secondary | ICD-10-CM | POA: Diagnosis present

## 2014-01-23 DIAGNOSIS — I10 Essential (primary) hypertension: Secondary | ICD-10-CM | POA: Diagnosis not present

## 2014-01-23 DIAGNOSIS — R079 Chest pain, unspecified: Secondary | ICD-10-CM | POA: Diagnosis not present

## 2014-01-23 DIAGNOSIS — J441 Chronic obstructive pulmonary disease with (acute) exacerbation: Secondary | ICD-10-CM | POA: Diagnosis not present

## 2014-01-23 DIAGNOSIS — R0602 Shortness of breath: Secondary | ICD-10-CM | POA: Diagnosis not present

## 2014-01-24 DIAGNOSIS — J441 Chronic obstructive pulmonary disease with (acute) exacerbation: Secondary | ICD-10-CM | POA: Diagnosis not present

## 2014-01-24 DIAGNOSIS — R079 Chest pain, unspecified: Secondary | ICD-10-CM | POA: Diagnosis not present

## 2014-01-24 DIAGNOSIS — I10 Essential (primary) hypertension: Secondary | ICD-10-CM | POA: Diagnosis not present

## 2014-01-24 DIAGNOSIS — R0602 Shortness of breath: Secondary | ICD-10-CM | POA: Diagnosis not present

## 2014-01-31 ENCOUNTER — Ambulatory Visit: Payer: Medicare Other | Admitting: Family Medicine

## 2014-01-31 ENCOUNTER — Encounter: Payer: Self-pay | Admitting: Family Medicine

## 2014-01-31 ENCOUNTER — Ambulatory Visit (INDEPENDENT_AMBULATORY_CARE_PROVIDER_SITE_OTHER): Payer: Medicare Other | Admitting: Family Medicine

## 2014-01-31 VITALS — BP 114/68 | HR 74 | Wt 147.0 lb

## 2014-01-31 DIAGNOSIS — J209 Acute bronchitis, unspecified: Secondary | ICD-10-CM | POA: Diagnosis not present

## 2014-01-31 DIAGNOSIS — Z23 Encounter for immunization: Secondary | ICD-10-CM

## 2014-01-31 DIAGNOSIS — J449 Chronic obstructive pulmonary disease, unspecified: Secondary | ICD-10-CM

## 2014-01-31 MED ORDER — ALBUTEROL SULFATE (2.5 MG/3ML) 0.083% IN NEBU
2.5000 mg | INHALATION_SOLUTION | Freq: Once | RESPIRATORY_TRACT | Status: AC
  Administered 2014-01-31: 2.5 mg via RESPIRATORY_TRACT

## 2014-01-31 NOTE — Patient Instructions (Signed)
Consider scheduling an appointment to see. Dr. Asencion Noble at [facility]. The phone number is [phone]. Option 1 for scheduling and make sure to ask for the [facility] location for an appointment. He is a good lung specialist/pulmonologist.

## 2014-01-31 NOTE — Progress Notes (Signed)
   Subjective:    Patient ID: Jared Lee, male    DOB: [birth year], 73 y.o.   MRN: [id number]  HPI Here for hospital follow-up for COPD exacerbation.  He was admitted to [facility]. He says he is actually feeling much better today. He had a "bad day" yesterday with his breathing. No fever chills or sweats. Still some cough with productive sputum. He is completing his antibiotic which he doesn't feel is that helpful. He refuses to take prednisone because it makes him feel poorly and causes lower extremity swelling and elevated glucose. He would like to consider seeing a different pulmonologist. He also wonders if he would be a candidate for lung transplant   He brought in an article from teh newspaper. He did bring his oxygen with him today but did not wear into the exam room. Using spiriva daily.   Review of Systems     Objective:   Physical Exam  Constitutional: He is oriented to person, place, and time. He appears well-developed and well-nourished.  HENT:  Head: Normocephalic and atraumatic.  Right Ear: External ear normal.  Left Ear: External ear normal.  Nose: Nose normal.  Mouth/Throat: Oropharynx is clear and moist.  TMs and canals are clear.   Eyes: Conjunctivae and EOM are normal. Pupils are equal, round, and reactive to light.  Neck: Neck supple. No thyromegaly present.  Cardiovascular: Normal rate, regular rhythm and normal heart sounds.   Pulmonary/Chest: Effort normal and breath sounds normal. He has no wheezes.  Prolonged expiration.   Lymphadenopathy:    He has no cervical adenopathy.  Neurological: He is alert and oriented to person, place, and time.  Skin: Skin is warm and dry.  Psychiatric: He has a normal mood and affect. His behavior is normal.          Assessment & Plan:  COPD exacerbation followup-stable today. We did give him an albuterol treatment here in the office because it was time for his scheduled treatment. Updated his pneumococcal vaccine  by giving him the Prevnar 13 today. Continue current regimen. Offered to make referral to new pulmonologist in [facility]. He just wanted the information written down and he says he will call himself. It emergency room if symptoms worsen overnight for over the weekend. Continue albuterol every 4 hours. Complete the antibiotic. Unfortunately, based on his age I do not think she will be a candidate for lung transplant and discuss this with him today.  I did review his medication list. He did not have Breo on his med list but was on our med list. Says admits to his handwritten list to make sure that he is taking it daily. He does take his Spiriva daily.

## 2014-02-02 DIAGNOSIS — I5030 Unspecified diastolic (congestive) heart failure: Secondary | ICD-10-CM | POA: Diagnosis not present

## 2014-02-02 DIAGNOSIS — E785 Hyperlipidemia, unspecified: Secondary | ICD-10-CM | POA: Diagnosis present

## 2014-02-02 DIAGNOSIS — Z9981 Dependence on supplemental oxygen: Secondary | ICD-10-CM | POA: Diagnosis not present

## 2014-02-02 DIAGNOSIS — Z87891 Personal history of nicotine dependence: Secondary | ICD-10-CM | POA: Diagnosis not present

## 2014-02-02 DIAGNOSIS — R0989 Other specified symptoms and signs involving the circulatory and respiratory systems: Secondary | ICD-10-CM | POA: Diagnosis not present

## 2014-02-02 DIAGNOSIS — J9610 Chronic respiratory failure, unspecified whether with hypoxia or hypercapnia: Secondary | ICD-10-CM | POA: Diagnosis not present

## 2014-02-02 DIAGNOSIS — J441 Chronic obstructive pulmonary disease with (acute) exacerbation: Secondary | ICD-10-CM | POA: Diagnosis not present

## 2014-02-02 DIAGNOSIS — I10 Essential (primary) hypertension: Secondary | ICD-10-CM | POA: Diagnosis not present

## 2014-02-02 DIAGNOSIS — R05 Cough: Secondary | ICD-10-CM | POA: Diagnosis not present

## 2014-02-02 DIAGNOSIS — K219 Gastro-esophageal reflux disease without esophagitis: Secondary | ICD-10-CM | POA: Diagnosis present

## 2014-02-02 DIAGNOSIS — R509 Fever, unspecified: Secondary | ICD-10-CM | POA: Diagnosis not present

## 2014-02-02 DIAGNOSIS — J438 Other emphysema: Secondary | ICD-10-CM | POA: Diagnosis not present

## 2014-02-02 DIAGNOSIS — Z7982 Long term (current) use of aspirin: Secondary | ICD-10-CM | POA: Diagnosis not present

## 2014-02-02 DIAGNOSIS — I509 Heart failure, unspecified: Secondary | ICD-10-CM | POA: Diagnosis present

## 2014-02-02 DIAGNOSIS — J4489 Other specified chronic obstructive pulmonary disease: Secondary | ICD-10-CM | POA: Diagnosis not present

## 2014-02-02 DIAGNOSIS — J449 Chronic obstructive pulmonary disease, unspecified: Secondary | ICD-10-CM | POA: Diagnosis not present

## 2014-02-02 DIAGNOSIS — E1169 Type 2 diabetes mellitus with other specified complication: Secondary | ICD-10-CM | POA: Diagnosis not present

## 2014-02-02 DIAGNOSIS — Z79899 Other long term (current) drug therapy: Secondary | ICD-10-CM | POA: Diagnosis not present

## 2014-02-02 DIAGNOSIS — R0609 Other forms of dyspnea: Secondary | ICD-10-CM | POA: Diagnosis not present

## 2014-02-02 DIAGNOSIS — H9190 Unspecified hearing loss, unspecified ear: Secondary | ICD-10-CM | POA: Diagnosis present

## 2014-02-02 DIAGNOSIS — I4891 Unspecified atrial fibrillation: Secondary | ICD-10-CM | POA: Diagnosis present

## 2014-02-02 DIAGNOSIS — E119 Type 2 diabetes mellitus without complications: Secondary | ICD-10-CM | POA: Diagnosis present

## 2014-02-02 DIAGNOSIS — Z8249 Family history of ischemic heart disease and other diseases of the circulatory system: Secondary | ICD-10-CM | POA: Diagnosis not present

## 2014-02-02 DIAGNOSIS — R059 Cough, unspecified: Secondary | ICD-10-CM | POA: Diagnosis not present

## 2014-02-05 ENCOUNTER — Other Ambulatory Visit: Payer: Self-pay

## 2014-02-05 DIAGNOSIS — E119 Type 2 diabetes mellitus without complications: Secondary | ICD-10-CM

## 2014-02-05 DIAGNOSIS — J441 Chronic obstructive pulmonary disease with (acute) exacerbation: Secondary | ICD-10-CM

## 2014-02-05 DIAGNOSIS — I10 Essential (primary) hypertension: Secondary | ICD-10-CM

## 2014-02-05 MED ORDER — BLOOD GLUCOSE MONITOR SYSTEM W/DEVICE KIT: PACK | Status: AC

## 2014-02-05 NOTE — Telephone Encounter (Signed)
Sent prescription to pharmacy.  

## 2014-02-06 ENCOUNTER — Other Ambulatory Visit: Payer: Self-pay | Admitting: *Deleted

## 2014-02-06 MED ORDER — ONETOUCH ULTRASOFT LANCETS MISC: Status: DC

## 2014-02-06 MED ORDER — AMBULATORY NON FORMULARY MEDICATION: Status: DC

## 2014-02-07 ENCOUNTER — Other Ambulatory Visit: Payer: Self-pay

## 2014-02-07 DIAGNOSIS — I10 Essential (primary) hypertension: Secondary | ICD-10-CM

## 2014-02-07 DIAGNOSIS — J449 Chronic obstructive pulmonary disease, unspecified: Secondary | ICD-10-CM

## 2014-02-07 DIAGNOSIS — E119 Type 2 diabetes mellitus without complications: Secondary | ICD-10-CM

## 2014-02-07 DIAGNOSIS — J441 Chronic obstructive pulmonary disease with (acute) exacerbation: Secondary | ICD-10-CM

## 2014-02-07 MED ORDER — AMBULATORY NON FORMULARY MEDICATION: Status: DC

## 2014-02-07 MED ORDER — ONETOUCH ULTRASOFT LANCETS MISC: Status: DC

## 2014-02-11 ENCOUNTER — Ambulatory Visit: Payer: Medicare Other | Admitting: Family Medicine

## 2014-02-13 DIAGNOSIS — I4891 Unspecified atrial fibrillation: Secondary | ICD-10-CM | POA: Diagnosis not present

## 2014-02-13 DIAGNOSIS — R0602 Shortness of breath: Secondary | ICD-10-CM | POA: Diagnosis not present

## 2014-02-13 DIAGNOSIS — Z9981 Dependence on supplemental oxygen: Secondary | ICD-10-CM | POA: Diagnosis not present

## 2014-02-13 DIAGNOSIS — K219 Gastro-esophageal reflux disease without esophagitis: Secondary | ICD-10-CM | POA: Diagnosis present

## 2014-02-13 DIAGNOSIS — Z8249 Family history of ischemic heart disease and other diseases of the circulatory system: Secondary | ICD-10-CM | POA: Diagnosis not present

## 2014-02-13 DIAGNOSIS — I10 Essential (primary) hypertension: Secondary | ICD-10-CM | POA: Diagnosis not present

## 2014-02-13 DIAGNOSIS — R059 Cough, unspecified: Secondary | ICD-10-CM | POA: Diagnosis not present

## 2014-02-13 DIAGNOSIS — Z79899 Other long term (current) drug therapy: Secondary | ICD-10-CM | POA: Diagnosis not present

## 2014-02-13 DIAGNOSIS — E785 Hyperlipidemia, unspecified: Secondary | ICD-10-CM | POA: Diagnosis present

## 2014-02-13 DIAGNOSIS — Z7982 Long term (current) use of aspirin: Secondary | ICD-10-CM | POA: Diagnosis not present

## 2014-02-13 DIAGNOSIS — J441 Chronic obstructive pulmonary disease with (acute) exacerbation: Secondary | ICD-10-CM | POA: Diagnosis not present

## 2014-02-13 DIAGNOSIS — R05 Cough: Secondary | ICD-10-CM | POA: Diagnosis not present

## 2014-02-13 DIAGNOSIS — E119 Type 2 diabetes mellitus without complications: Secondary | ICD-10-CM | POA: Diagnosis present

## 2014-02-13 DIAGNOSIS — H9190 Unspecified hearing loss, unspecified ear: Secondary | ICD-10-CM | POA: Diagnosis present

## 2014-02-13 DIAGNOSIS — Z87891 Personal history of nicotine dependence: Secondary | ICD-10-CM | POA: Diagnosis not present

## 2014-02-13 DIAGNOSIS — J9819 Other pulmonary collapse: Secondary | ICD-10-CM | POA: Diagnosis not present

## 2014-02-13 DIAGNOSIS — I509 Heart failure, unspecified: Secondary | ICD-10-CM | POA: Diagnosis not present

## 2014-02-13 DIAGNOSIS — R079 Chest pain, unspecified: Secondary | ICD-10-CM | POA: Diagnosis not present

## 2014-02-13 DIAGNOSIS — J438 Other emphysema: Secondary | ICD-10-CM | POA: Diagnosis not present

## 2014-02-21 ENCOUNTER — Ambulatory Visit: Payer: Medicare Other | Admitting: Family Medicine

## 2014-03-01 DIAGNOSIS — I509 Heart failure, unspecified: Secondary | ICD-10-CM | POA: Diagnosis not present

## 2014-03-01 DIAGNOSIS — R0602 Shortness of breath: Secondary | ICD-10-CM | POA: Diagnosis not present

## 2014-03-01 DIAGNOSIS — J449 Chronic obstructive pulmonary disease, unspecified: Secondary | ICD-10-CM | POA: Diagnosis not present

## 2014-03-01 DIAGNOSIS — J4489 Other specified chronic obstructive pulmonary disease: Secondary | ICD-10-CM | POA: Diagnosis not present

## 2014-03-01 DIAGNOSIS — Z79899 Other long term (current) drug therapy: Secondary | ICD-10-CM | POA: Diagnosis not present

## 2014-03-01 DIAGNOSIS — R0989 Other specified symptoms and signs involving the circulatory and respiratory systems: Secondary | ICD-10-CM | POA: Diagnosis not present

## 2014-03-01 DIAGNOSIS — I10 Essential (primary) hypertension: Secondary | ICD-10-CM | POA: Diagnosis not present

## 2014-03-01 DIAGNOSIS — R0609 Other forms of dyspnea: Secondary | ICD-10-CM | POA: Diagnosis not present

## 2014-03-01 DIAGNOSIS — E1169 Type 2 diabetes mellitus with other specified complication: Secondary | ICD-10-CM | POA: Diagnosis not present

## 2014-03-01 DIAGNOSIS — Z9981 Dependence on supplemental oxygen: Secondary | ICD-10-CM | POA: Diagnosis not present

## 2014-03-07 DIAGNOSIS — J438 Other emphysema: Secondary | ICD-10-CM | POA: Diagnosis not present

## 2014-03-07 DIAGNOSIS — I509 Heart failure, unspecified: Secondary | ICD-10-CM | POA: Diagnosis not present

## 2014-03-07 DIAGNOSIS — R079 Chest pain, unspecified: Secondary | ICD-10-CM | POA: Diagnosis not present

## 2014-03-07 DIAGNOSIS — H9190 Unspecified hearing loss, unspecified ear: Secondary | ICD-10-CM | POA: Diagnosis not present

## 2014-03-07 DIAGNOSIS — R059 Cough, unspecified: Secondary | ICD-10-CM | POA: Diagnosis not present

## 2014-03-07 DIAGNOSIS — R5383 Other fatigue: Secondary | ICD-10-CM | POA: Diagnosis not present

## 2014-03-07 DIAGNOSIS — Z9981 Dependence on supplemental oxygen: Secondary | ICD-10-CM | POA: Diagnosis not present

## 2014-03-07 DIAGNOSIS — R5381 Other malaise: Secondary | ICD-10-CM | POA: Diagnosis not present

## 2014-03-07 DIAGNOSIS — R05 Cough: Secondary | ICD-10-CM | POA: Diagnosis not present

## 2014-03-07 DIAGNOSIS — R0602 Shortness of breath: Secondary | ICD-10-CM | POA: Diagnosis not present

## 2014-03-07 DIAGNOSIS — J441 Chronic obstructive pulmonary disease with (acute) exacerbation: Secondary | ICD-10-CM | POA: Diagnosis not present

## 2014-03-07 DIAGNOSIS — I10 Essential (primary) hypertension: Secondary | ICD-10-CM | POA: Diagnosis not present

## 2014-03-07 DIAGNOSIS — Z79899 Other long term (current) drug therapy: Secondary | ICD-10-CM | POA: Diagnosis not present

## 2014-03-07 DIAGNOSIS — E1169 Type 2 diabetes mellitus with other specified complication: Secondary | ICD-10-CM | POA: Diagnosis not present

## 2014-03-08 ENCOUNTER — Other Ambulatory Visit: Payer: Self-pay | Admitting: *Deleted

## 2014-03-08 MED ORDER — ALBUTEROL SULFATE (2.5 MG/3ML) 0.083% IN NEBU: INHALATION_SOLUTION | RESPIRATORY_TRACT | Status: DC

## 2014-03-17 DIAGNOSIS — IMO0002 Reserved for concepts with insufficient information to code with codable children: Secondary | ICD-10-CM | POA: Diagnosis not present

## 2014-03-17 DIAGNOSIS — I4891 Unspecified atrial fibrillation: Secondary | ICD-10-CM | POA: Diagnosis not present

## 2014-03-17 DIAGNOSIS — J438 Other emphysema: Secondary | ICD-10-CM | POA: Diagnosis not present

## 2014-03-17 DIAGNOSIS — I509 Heart failure, unspecified: Secondary | ICD-10-CM | POA: Diagnosis present

## 2014-03-17 DIAGNOSIS — E119 Type 2 diabetes mellitus without complications: Secondary | ICD-10-CM | POA: Diagnosis present

## 2014-03-17 DIAGNOSIS — H9190 Unspecified hearing loss, unspecified ear: Secondary | ICD-10-CM | POA: Diagnosis present

## 2014-03-17 DIAGNOSIS — K225 Diverticulum of esophagus, acquired: Secondary | ICD-10-CM | POA: Diagnosis not present

## 2014-03-17 DIAGNOSIS — Z8249 Family history of ischemic heart disease and other diseases of the circulatory system: Secondary | ICD-10-CM | POA: Diagnosis not present

## 2014-03-17 DIAGNOSIS — I10 Essential (primary) hypertension: Secondary | ICD-10-CM | POA: Diagnosis not present

## 2014-03-17 DIAGNOSIS — J449 Chronic obstructive pulmonary disease, unspecified: Secondary | ICD-10-CM | POA: Diagnosis not present

## 2014-03-17 DIAGNOSIS — J189 Pneumonia, unspecified organism: Secondary | ICD-10-CM | POA: Diagnosis not present

## 2014-03-17 DIAGNOSIS — R933 Abnormal findings on diagnostic imaging of other parts of digestive tract: Secondary | ICD-10-CM | POA: Diagnosis not present

## 2014-03-17 DIAGNOSIS — J441 Chronic obstructive pulmonary disease with (acute) exacerbation: Secondary | ICD-10-CM | POA: Diagnosis not present

## 2014-03-17 DIAGNOSIS — IMO0001 Reserved for inherently not codable concepts without codable children: Secondary | ICD-10-CM | POA: Diagnosis not present

## 2014-03-17 DIAGNOSIS — K219 Gastro-esophageal reflux disease without esophagitis: Secondary | ICD-10-CM | POA: Diagnosis present

## 2014-03-17 DIAGNOSIS — R0602 Shortness of breath: Secondary | ICD-10-CM | POA: Diagnosis not present

## 2014-03-17 DIAGNOSIS — R059 Cough, unspecified: Secondary | ICD-10-CM | POA: Diagnosis not present

## 2014-03-17 DIAGNOSIS — J9610 Chronic respiratory failure, unspecified whether with hypoxia or hypercapnia: Secondary | ICD-10-CM | POA: Diagnosis not present

## 2014-03-17 DIAGNOSIS — E1065 Type 1 diabetes mellitus with hyperglycemia: Secondary | ICD-10-CM | POA: Diagnosis not present

## 2014-03-17 DIAGNOSIS — R05 Cough: Secondary | ICD-10-CM | POA: Diagnosis not present

## 2014-03-17 DIAGNOSIS — E785 Hyperlipidemia, unspecified: Secondary | ICD-10-CM | POA: Diagnosis not present

## 2014-03-17 DIAGNOSIS — Z9981 Dependence on supplemental oxygen: Secondary | ICD-10-CM | POA: Diagnosis not present

## 2014-03-25 ENCOUNTER — Other Ambulatory Visit: Payer: Self-pay | Admitting: Family Medicine

## 2014-03-25 DIAGNOSIS — K225 Diverticulum of esophagus, acquired: Secondary | ICD-10-CM | POA: Diagnosis not present

## 2014-03-25 DIAGNOSIS — H903 Sensorineural hearing loss, bilateral: Secondary | ICD-10-CM | POA: Diagnosis not present

## 2014-03-25 DIAGNOSIS — J449 Chronic obstructive pulmonary disease, unspecified: Secondary | ICD-10-CM | POA: Diagnosis not present

## 2014-03-26 ENCOUNTER — Other Ambulatory Visit: Payer: Self-pay | Admitting: Family Medicine

## 2014-03-26 MED ORDER — IPRATROPIUM BROMIDE 0.02 % IN SOLN: 0.5000 mg | Freq: Four times a day (QID) | RESPIRATORY_TRACT | Status: DC | PRN

## 2014-03-27 ENCOUNTER — Ambulatory Visit: Payer: Medicare Other | Admitting: Family Medicine

## 2014-03-31 DIAGNOSIS — Z7982 Long term (current) use of aspirin: Secondary | ICD-10-CM | POA: Diagnosis not present

## 2014-03-31 DIAGNOSIS — R05 Cough: Secondary | ICD-10-CM | POA: Diagnosis not present

## 2014-03-31 DIAGNOSIS — R059 Cough, unspecified: Secondary | ICD-10-CM | POA: Diagnosis not present

## 2014-03-31 DIAGNOSIS — J4489 Other specified chronic obstructive pulmonary disease: Secondary | ICD-10-CM | POA: Diagnosis not present

## 2014-03-31 DIAGNOSIS — J9610 Chronic respiratory failure, unspecified whether with hypoxia or hypercapnia: Secondary | ICD-10-CM | POA: Diagnosis present

## 2014-03-31 DIAGNOSIS — E871 Hypo-osmolality and hyponatremia: Secondary | ICD-10-CM | POA: Diagnosis not present

## 2014-03-31 DIAGNOSIS — I4891 Unspecified atrial fibrillation: Secondary | ICD-10-CM | POA: Diagnosis not present

## 2014-03-31 DIAGNOSIS — Z79899 Other long term (current) drug therapy: Secondary | ICD-10-CM | POA: Diagnosis not present

## 2014-03-31 DIAGNOSIS — R0602 Shortness of breath: Secondary | ICD-10-CM | POA: Diagnosis not present

## 2014-03-31 DIAGNOSIS — J441 Chronic obstructive pulmonary disease with (acute) exacerbation: Secondary | ICD-10-CM | POA: Diagnosis present

## 2014-03-31 DIAGNOSIS — K219 Gastro-esophageal reflux disease without esophagitis: Secondary | ICD-10-CM | POA: Diagnosis not present

## 2014-03-31 DIAGNOSIS — J9819 Other pulmonary collapse: Secondary | ICD-10-CM | POA: Diagnosis not present

## 2014-03-31 DIAGNOSIS — E785 Hyperlipidemia, unspecified: Secondary | ICD-10-CM | POA: Diagnosis present

## 2014-03-31 DIAGNOSIS — I10 Essential (primary) hypertension: Secondary | ICD-10-CM | POA: Diagnosis not present

## 2014-03-31 DIAGNOSIS — Z9981 Dependence on supplemental oxygen: Secondary | ICD-10-CM | POA: Diagnosis not present

## 2014-03-31 DIAGNOSIS — J209 Acute bronchitis, unspecified: Secondary | ICD-10-CM | POA: Diagnosis not present

## 2014-03-31 DIAGNOSIS — IMO0001 Reserved for inherently not codable concepts without codable children: Secondary | ICD-10-CM | POA: Diagnosis not present

## 2014-03-31 DIAGNOSIS — IMO0002 Reserved for concepts with insufficient information to code with codable children: Secondary | ICD-10-CM | POA: Diagnosis not present

## 2014-03-31 DIAGNOSIS — R197 Diarrhea, unspecified: Secondary | ICD-10-CM | POA: Diagnosis not present

## 2014-03-31 DIAGNOSIS — J40 Bronchitis, not specified as acute or chronic: Secondary | ICD-10-CM | POA: Diagnosis not present

## 2014-03-31 DIAGNOSIS — E119 Type 2 diabetes mellitus without complications: Secondary | ICD-10-CM | POA: Diagnosis present

## 2014-03-31 DIAGNOSIS — J449 Chronic obstructive pulmonary disease, unspecified: Secondary | ICD-10-CM | POA: Diagnosis not present

## 2014-03-31 DIAGNOSIS — E1065 Type 1 diabetes mellitus with hyperglycemia: Secondary | ICD-10-CM | POA: Diagnosis not present

## 2014-03-31 DIAGNOSIS — J438 Other emphysema: Secondary | ICD-10-CM | POA: Diagnosis not present

## 2014-03-31 DIAGNOSIS — H9190 Unspecified hearing loss, unspecified ear: Secondary | ICD-10-CM | POA: Diagnosis present

## 2014-03-31 DIAGNOSIS — F411 Generalized anxiety disorder: Secondary | ICD-10-CM | POA: Diagnosis not present

## 2014-03-31 DIAGNOSIS — I509 Heart failure, unspecified: Secondary | ICD-10-CM | POA: Diagnosis present

## 2014-04-04 DIAGNOSIS — I10 Essential (primary) hypertension: Secondary | ICD-10-CM | POA: Diagnosis not present

## 2014-04-04 DIAGNOSIS — J440 Chronic obstructive pulmonary disease with acute lower respiratory infection: Secondary | ICD-10-CM | POA: Diagnosis not present

## 2014-04-04 DIAGNOSIS — I4891 Unspecified atrial fibrillation: Secondary | ICD-10-CM | POA: Diagnosis not present

## 2014-04-04 DIAGNOSIS — E119 Type 2 diabetes mellitus without complications: Secondary | ICD-10-CM | POA: Diagnosis not present

## 2014-04-05 ENCOUNTER — Other Ambulatory Visit: Payer: Self-pay | Admitting: *Deleted

## 2014-04-07 DIAGNOSIS — R0602 Shortness of breath: Secondary | ICD-10-CM | POA: Diagnosis not present

## 2014-04-07 DIAGNOSIS — J438 Other emphysema: Secondary | ICD-10-CM | POA: Diagnosis not present

## 2014-04-07 DIAGNOSIS — J449 Chronic obstructive pulmonary disease, unspecified: Secondary | ICD-10-CM | POA: Diagnosis not present

## 2014-04-07 DIAGNOSIS — I4891 Unspecified atrial fibrillation: Secondary | ICD-10-CM | POA: Diagnosis not present

## 2014-04-07 DIAGNOSIS — E119 Type 2 diabetes mellitus without complications: Secondary | ICD-10-CM | POA: Diagnosis not present

## 2014-04-07 DIAGNOSIS — R079 Chest pain, unspecified: Secondary | ICD-10-CM | POA: Diagnosis not present

## 2014-04-07 DIAGNOSIS — R0789 Other chest pain: Secondary | ICD-10-CM | POA: Diagnosis not present

## 2014-04-07 DIAGNOSIS — R609 Edema, unspecified: Secondary | ICD-10-CM | POA: Diagnosis not present

## 2014-04-08 DIAGNOSIS — R0902 Hypoxemia: Secondary | ICD-10-CM | POA: Diagnosis not present

## 2014-04-08 DIAGNOSIS — E119 Type 2 diabetes mellitus without complications: Secondary | ICD-10-CM | POA: Diagnosis not present

## 2014-04-08 DIAGNOSIS — R079 Chest pain, unspecified: Secondary | ICD-10-CM | POA: Diagnosis not present

## 2014-04-08 DIAGNOSIS — I4891 Unspecified atrial fibrillation: Secondary | ICD-10-CM | POA: Diagnosis not present

## 2014-04-08 DIAGNOSIS — Z515 Encounter for palliative care: Secondary | ICD-10-CM | POA: Diagnosis not present

## 2014-04-08 DIAGNOSIS — J449 Chronic obstructive pulmonary disease, unspecified: Secondary | ICD-10-CM | POA: Diagnosis not present

## 2014-04-08 DIAGNOSIS — J9600 Acute respiratory failure, unspecified whether with hypoxia or hypercapnia: Secondary | ICD-10-CM | POA: Diagnosis not present

## 2014-04-08 DIAGNOSIS — J438 Other emphysema: Secondary | ICD-10-CM | POA: Diagnosis not present

## 2014-04-08 DIAGNOSIS — I509 Heart failure, unspecified: Secondary | ICD-10-CM | POA: Diagnosis not present

## 2014-04-08 DIAGNOSIS — J45902 Unspecified asthma with status asthmaticus: Secondary | ICD-10-CM | POA: Diagnosis not present

## 2014-04-08 DIAGNOSIS — I5030 Unspecified diastolic (congestive) heart failure: Secondary | ICD-10-CM | POA: Diagnosis not present

## 2014-04-09 ENCOUNTER — Ambulatory Visit: Payer: Medicare Other | Admitting: Family Medicine

## 2014-04-09 DIAGNOSIS — I4891 Unspecified atrial fibrillation: Secondary | ICD-10-CM | POA: Diagnosis not present

## 2014-04-09 DIAGNOSIS — J438 Other emphysema: Secondary | ICD-10-CM | POA: Diagnosis not present

## 2014-04-09 DIAGNOSIS — Z515 Encounter for palliative care: Secondary | ICD-10-CM | POA: Diagnosis not present

## 2014-04-09 DIAGNOSIS — E119 Type 2 diabetes mellitus without complications: Secondary | ICD-10-CM | POA: Diagnosis not present

## 2014-04-09 DIAGNOSIS — I509 Heart failure, unspecified: Secondary | ICD-10-CM | POA: Diagnosis not present

## 2014-04-09 DIAGNOSIS — J449 Chronic obstructive pulmonary disease, unspecified: Secondary | ICD-10-CM | POA: Diagnosis not present

## 2014-04-09 DIAGNOSIS — R079 Chest pain, unspecified: Secondary | ICD-10-CM | POA: Diagnosis not present

## 2014-04-09 DIAGNOSIS — I5030 Unspecified diastolic (congestive) heart failure: Secondary | ICD-10-CM | POA: Diagnosis not present

## 2014-04-09 DIAGNOSIS — J45902 Unspecified asthma with status asthmaticus: Secondary | ICD-10-CM | POA: Diagnosis not present

## 2014-04-09 DIAGNOSIS — R0902 Hypoxemia: Secondary | ICD-10-CM | POA: Diagnosis not present

## 2014-04-09 DIAGNOSIS — J9600 Acute respiratory failure, unspecified whether with hypoxia or hypercapnia: Secondary | ICD-10-CM | POA: Diagnosis not present

## 2014-04-10 ENCOUNTER — Ambulatory Visit: Payer: Medicare Other | Admitting: Physician Assistant

## 2014-04-10 DIAGNOSIS — I4891 Unspecified atrial fibrillation: Secondary | ICD-10-CM | POA: Diagnosis not present

## 2014-04-10 DIAGNOSIS — R079 Chest pain, unspecified: Secondary | ICD-10-CM | POA: Diagnosis not present

## 2014-04-10 DIAGNOSIS — J45902 Unspecified asthma with status asthmaticus: Secondary | ICD-10-CM | POA: Diagnosis not present

## 2014-04-10 DIAGNOSIS — J449 Chronic obstructive pulmonary disease, unspecified: Secondary | ICD-10-CM | POA: Diagnosis not present

## 2014-04-10 DIAGNOSIS — E119 Type 2 diabetes mellitus without complications: Secondary | ICD-10-CM | POA: Diagnosis not present

## 2014-04-11 DIAGNOSIS — J449 Chronic obstructive pulmonary disease, unspecified: Secondary | ICD-10-CM | POA: Diagnosis not present

## 2014-04-11 DIAGNOSIS — I5030 Unspecified diastolic (congestive) heart failure: Secondary | ICD-10-CM | POA: Diagnosis not present

## 2014-04-11 DIAGNOSIS — R0902 Hypoxemia: Secondary | ICD-10-CM | POA: Diagnosis not present

## 2014-04-11 DIAGNOSIS — E441 Mild protein-calorie malnutrition: Secondary | ICD-10-CM | POA: Diagnosis not present

## 2014-04-11 DIAGNOSIS — Z79899 Other long term (current) drug therapy: Secondary | ICD-10-CM | POA: Diagnosis not present

## 2014-04-11 DIAGNOSIS — I5032 Chronic diastolic (congestive) heart failure: Secondary | ICD-10-CM | POA: Diagnosis present

## 2014-04-11 DIAGNOSIS — H9190 Unspecified hearing loss, unspecified ear: Secondary | ICD-10-CM | POA: Diagnosis present

## 2014-04-11 DIAGNOSIS — A0472 Enterocolitis due to Clostridium difficile, not specified as recurrent: Secondary | ICD-10-CM | POA: Diagnosis not present

## 2014-04-11 DIAGNOSIS — J441 Chronic obstructive pulmonary disease with (acute) exacerbation: Secondary | ICD-10-CM | POA: Diagnosis not present

## 2014-04-11 DIAGNOSIS — I4891 Unspecified atrial fibrillation: Secondary | ICD-10-CM | POA: Diagnosis not present

## 2014-04-11 DIAGNOSIS — Z515 Encounter for palliative care: Secondary | ICD-10-CM | POA: Diagnosis not present

## 2014-04-11 DIAGNOSIS — I509 Heart failure, unspecified: Secondary | ICD-10-CM | POA: Diagnosis present

## 2014-04-11 DIAGNOSIS — Z7982 Long term (current) use of aspirin: Secondary | ICD-10-CM | POA: Diagnosis not present

## 2014-04-11 DIAGNOSIS — I10 Essential (primary) hypertension: Secondary | ICD-10-CM | POA: Diagnosis present

## 2014-04-11 DIAGNOSIS — J9610 Chronic respiratory failure, unspecified whether with hypoxia or hypercapnia: Secondary | ICD-10-CM | POA: Diagnosis present

## 2014-04-11 DIAGNOSIS — J438 Other emphysema: Secondary | ICD-10-CM | POA: Diagnosis not present

## 2014-04-11 DIAGNOSIS — R079 Chest pain, unspecified: Secondary | ICD-10-CM | POA: Diagnosis not present

## 2014-04-11 DIAGNOSIS — R5381 Other malaise: Secondary | ICD-10-CM | POA: Diagnosis present

## 2014-04-11 DIAGNOSIS — R0789 Other chest pain: Secondary | ICD-10-CM | POA: Diagnosis present

## 2014-04-11 DIAGNOSIS — IMO0002 Reserved for concepts with insufficient information to code with codable children: Secondary | ICD-10-CM | POA: Diagnosis not present

## 2014-04-11 DIAGNOSIS — Z9981 Dependence on supplemental oxygen: Secondary | ICD-10-CM | POA: Diagnosis not present

## 2014-04-11 DIAGNOSIS — J9600 Acute respiratory failure, unspecified whether with hypoxia or hypercapnia: Secondary | ICD-10-CM | POA: Diagnosis not present

## 2014-04-11 DIAGNOSIS — E119 Type 2 diabetes mellitus without complications: Secondary | ICD-10-CM | POA: Diagnosis not present

## 2014-04-14 DIAGNOSIS — R9431 Abnormal electrocardiogram [ECG] [EKG]: Secondary | ICD-10-CM | POA: Diagnosis not present

## 2014-04-14 DIAGNOSIS — E875 Hyperkalemia: Secondary | ICD-10-CM | POA: Diagnosis not present

## 2014-04-14 DIAGNOSIS — A0472 Enterocolitis due to Clostridium difficile, not specified as recurrent: Secondary | ICD-10-CM | POA: Diagnosis not present

## 2014-04-14 DIAGNOSIS — Z9981 Dependence on supplemental oxygen: Secondary | ICD-10-CM | POA: Diagnosis not present

## 2014-04-14 DIAGNOSIS — I10 Essential (primary) hypertension: Secondary | ICD-10-CM | POA: Diagnosis not present

## 2014-04-14 DIAGNOSIS — E119 Type 2 diabetes mellitus without complications: Secondary | ICD-10-CM | POA: Diagnosis not present

## 2014-04-14 DIAGNOSIS — J449 Chronic obstructive pulmonary disease, unspecified: Secondary | ICD-10-CM | POA: Diagnosis not present

## 2014-04-14 DIAGNOSIS — N179 Acute kidney failure, unspecified: Secondary | ICD-10-CM | POA: Diagnosis not present

## 2014-04-14 DIAGNOSIS — H9190 Unspecified hearing loss, unspecified ear: Secondary | ICD-10-CM | POA: Diagnosis present

## 2014-04-14 DIAGNOSIS — J440 Chronic obstructive pulmonary disease with acute lower respiratory infection: Secondary | ICD-10-CM | POA: Diagnosis not present

## 2014-04-14 DIAGNOSIS — J441 Chronic obstructive pulmonary disease with (acute) exacerbation: Secondary | ICD-10-CM | POA: Diagnosis present

## 2014-04-14 DIAGNOSIS — I4891 Unspecified atrial fibrillation: Secondary | ICD-10-CM | POA: Diagnosis not present

## 2014-04-14 DIAGNOSIS — K5010 Crohn's disease of large intestine without complications: Secondary | ICD-10-CM | POA: Diagnosis not present

## 2014-04-14 DIAGNOSIS — R197 Diarrhea, unspecified: Secondary | ICD-10-CM | POA: Diagnosis not present

## 2014-04-14 DIAGNOSIS — E871 Hypo-osmolality and hyponatremia: Secondary | ICD-10-CM | POA: Diagnosis present

## 2014-04-14 DIAGNOSIS — E869 Volume depletion, unspecified: Secondary | ICD-10-CM | POA: Diagnosis not present

## 2014-04-14 DIAGNOSIS — J9610 Chronic respiratory failure, unspecified whether with hypoxia or hypercapnia: Secondary | ICD-10-CM | POA: Diagnosis present

## 2014-04-14 DIAGNOSIS — Z79899 Other long term (current) drug therapy: Secondary | ICD-10-CM | POA: Diagnosis not present

## 2014-04-14 DIAGNOSIS — A09 Infectious gastroenteritis and colitis, unspecified: Secondary | ICD-10-CM | POA: Diagnosis not present

## 2014-04-16 DIAGNOSIS — E119 Type 2 diabetes mellitus without complications: Secondary | ICD-10-CM | POA: Diagnosis not present

## 2014-04-16 DIAGNOSIS — I4891 Unspecified atrial fibrillation: Secondary | ICD-10-CM | POA: Diagnosis not present

## 2014-04-16 DIAGNOSIS — I10 Essential (primary) hypertension: Secondary | ICD-10-CM

## 2014-04-16 DIAGNOSIS — J440 Chronic obstructive pulmonary disease with acute lower respiratory infection: Secondary | ICD-10-CM | POA: Diagnosis not present

## 2014-04-17 DIAGNOSIS — I10 Essential (primary) hypertension: Secondary | ICD-10-CM | POA: Diagnosis not present

## 2014-04-17 DIAGNOSIS — J440 Chronic obstructive pulmonary disease with acute lower respiratory infection: Secondary | ICD-10-CM | POA: Diagnosis not present

## 2014-04-17 DIAGNOSIS — E119 Type 2 diabetes mellitus without complications: Secondary | ICD-10-CM | POA: Diagnosis not present

## 2014-04-17 DIAGNOSIS — I4891 Unspecified atrial fibrillation: Secondary | ICD-10-CM | POA: Diagnosis not present

## 2014-04-19 DIAGNOSIS — I10 Essential (primary) hypertension: Secondary | ICD-10-CM | POA: Diagnosis not present

## 2014-04-19 DIAGNOSIS — J440 Chronic obstructive pulmonary disease with acute lower respiratory infection: Secondary | ICD-10-CM | POA: Diagnosis not present

## 2014-04-19 DIAGNOSIS — E119 Type 2 diabetes mellitus without complications: Secondary | ICD-10-CM | POA: Diagnosis not present

## 2014-04-19 DIAGNOSIS — I4891 Unspecified atrial fibrillation: Secondary | ICD-10-CM | POA: Diagnosis not present

## 2014-04-23 ENCOUNTER — Telehealth: Payer: Self-pay | Admitting: *Deleted

## 2014-04-23 DIAGNOSIS — I10 Essential (primary) hypertension: Secondary | ICD-10-CM | POA: Diagnosis not present

## 2014-04-23 DIAGNOSIS — E119 Type 2 diabetes mellitus without complications: Secondary | ICD-10-CM | POA: Diagnosis not present

## 2014-04-23 DIAGNOSIS — J440 Chronic obstructive pulmonary disease with acute lower respiratory infection: Secondary | ICD-10-CM | POA: Diagnosis not present

## 2014-04-23 DIAGNOSIS — I4891 Unspecified atrial fibrillation: Secondary | ICD-10-CM | POA: Diagnosis not present

## 2014-04-23 LAB — HEMOGLOBIN A1C: Hgb A1c MFr Bld: 6.5 % — AB (ref 4.0–6.0)

## 2014-04-23 NOTE — Telephone Encounter (Signed)
Would like copy of last a1c faxed to [phone].Jared Lee

## 2014-04-24 ENCOUNTER — Encounter: Payer: Self-pay | Admitting: Physician Assistant

## 2014-04-24 ENCOUNTER — Ambulatory Visit (INDEPENDENT_AMBULATORY_CARE_PROVIDER_SITE_OTHER): Payer: Medicare Other | Admitting: Physician Assistant

## 2014-04-24 VITALS — BP 119/75 | HR 84 | Ht 71.0 in | Wt 148.0 lb

## 2014-04-24 DIAGNOSIS — H9193 Unspecified hearing loss, bilateral: Secondary | ICD-10-CM

## 2014-04-24 DIAGNOSIS — A0472 Enterocolitis due to Clostridium difficile, not specified as recurrent: Secondary | ICD-10-CM

## 2014-04-24 DIAGNOSIS — J42 Unspecified chronic bronchitis: Secondary | ICD-10-CM | POA: Diagnosis not present

## 2014-04-24 DIAGNOSIS — H9190 Unspecified hearing loss, unspecified ear: Secondary | ICD-10-CM | POA: Diagnosis not present

## 2014-04-24 NOTE — Patient Instructions (Signed)
Will call results

## 2014-04-24 NOTE — Progress Notes (Signed)
   Subjective:    Patient ID: CARMELO REIDEL, male    DOB: 04/04/1941, 73 y.o.   MRN: [id number]  HPI Patient is a 73 year old male who presents to the clinic to followup from hospital visit. Patient was admitted to the hospital on 04/14/2014 and discharged on 04/16/2014. He was diagnosed with clostridium differential diarrhea and chronic obstructive pulmonary disease exacerbation. He was given prednisone and Flagyl to continue to take after discharge. When patient was admitted he was going to the bathroom with diarrhea 25 times a day. He does admit this has improved. He had 4 bowel movements yesterday and twice today. He was discharged with blood pressure of 129/77, pulse ox of 97% on oxygen, sodium 132, potassium 5.3, creatinine 0.92 and white blood count of 10.4. Patient does agree to be breathing much better and stable. He is taking his ongoing chronic medication and inhalers.  It is important to note that in his discharge summary he was telling nurses he was having 12-13 loose stools every day but when they were monitoring he had one small formed stool daily.  Review of Systems  All other systems reviewed and are negative.      Objective:   Physical Exam  Constitutional: He is oriented to person, place, and time. He appears well-developed and well-nourished.  HENT:  Head: Normocephalic and atraumatic.  Cardiovascular: Normal rate, regular rhythm and normal heart sounds.   Pulmonary/Chest:  Coarse breath sounds. No wheezing.   Abdominal: Soft. Bowel sounds are normal. He exhibits no distension and no mass. There is no tenderness. There is no rebound and no guarding.  Neurological: He is alert and oriented to person, place, and time.  Skin: Skin is dry.  Psychiatric: He has a normal mood and affect. His behavior is normal.          Assessment & Plan:  Clostridium difficile- will recheck c.diff with stool culture. Pt has finished flagyl. I do think resolved. Per hx pt has loose stools  with prednisone as well and he just got off of prednisone. Concerned that pt not be a credible historian of actual number of stools. Pt reports CBC and CMP just drawn by home health and to get results from them. He does not want blood drawn today. Follow up with any worsening symptoms.   COPD- stable today. Continue daily inhalers.    Severe bilateral deafness- pt does not want hearing aids or for this problem to be addressed.

## 2014-04-26 ENCOUNTER — Encounter: Payer: Self-pay | Admitting: Family Medicine

## 2014-04-26 ENCOUNTER — Telehealth: Payer: Self-pay | Admitting: Physician Assistant

## 2014-04-26 DIAGNOSIS — I4891 Unspecified atrial fibrillation: Secondary | ICD-10-CM | POA: Diagnosis not present

## 2014-04-26 DIAGNOSIS — E119 Type 2 diabetes mellitus without complications: Secondary | ICD-10-CM | POA: Diagnosis not present

## 2014-04-26 DIAGNOSIS — I10 Essential (primary) hypertension: Secondary | ICD-10-CM | POA: Diagnosis not present

## 2014-04-26 DIAGNOSIS — J440 Chronic obstructive pulmonary disease with acute lower respiratory infection: Secondary | ICD-10-CM | POA: Diagnosis not present

## 2014-04-26 NOTE — Telephone Encounter (Signed)
Please call home health agency and see if we can get last bloodwork drawn by them.

## 2014-04-29 DIAGNOSIS — I4891 Unspecified atrial fibrillation: Secondary | ICD-10-CM | POA: Diagnosis not present

## 2014-04-29 DIAGNOSIS — I10 Essential (primary) hypertension: Secondary | ICD-10-CM | POA: Diagnosis not present

## 2014-04-29 DIAGNOSIS — J440 Chronic obstructive pulmonary disease with acute lower respiratory infection: Secondary | ICD-10-CM | POA: Diagnosis not present

## 2014-04-29 DIAGNOSIS — E119 Type 2 diabetes mellitus without complications: Secondary | ICD-10-CM | POA: Diagnosis not present

## 2014-04-29 LAB — STOOL CULTURE

## 2014-04-30 DIAGNOSIS — I4891 Unspecified atrial fibrillation: Secondary | ICD-10-CM | POA: Diagnosis not present

## 2014-04-30 DIAGNOSIS — E119 Type 2 diabetes mellitus without complications: Secondary | ICD-10-CM | POA: Diagnosis not present

## 2014-04-30 DIAGNOSIS — J440 Chronic obstructive pulmonary disease with acute lower respiratory infection: Secondary | ICD-10-CM | POA: Diagnosis not present

## 2014-04-30 DIAGNOSIS — I10 Essential (primary) hypertension: Secondary | ICD-10-CM | POA: Diagnosis not present

## 2014-04-30 LAB — CLOSTRIDIUM DIFFICILE CULTURE-FECAL

## 2014-04-30 NOTE — Telephone Encounter (Signed)
[facility] is faxing labs from 8/25.

## 2014-05-01 ENCOUNTER — Telehealth: Payer: Self-pay | Admitting: *Deleted

## 2014-05-01 DIAGNOSIS — E119 Type 2 diabetes mellitus without complications: Secondary | ICD-10-CM | POA: Diagnosis not present

## 2014-05-01 DIAGNOSIS — I10 Essential (primary) hypertension: Secondary | ICD-10-CM | POA: Diagnosis not present

## 2014-05-01 DIAGNOSIS — I4891 Unspecified atrial fibrillation: Secondary | ICD-10-CM | POA: Diagnosis not present

## 2014-05-01 DIAGNOSIS — J440 Chronic obstructive pulmonary disease with acute lower respiratory infection: Secondary | ICD-10-CM | POA: Diagnosis not present

## 2014-05-01 NOTE — Telephone Encounter (Signed)
Called and informed the nurse. She stated that she will recollect.Maryruth Eve, [place] Crocker

## 2014-05-01 NOTE — Telephone Encounter (Signed)
Have them re\re collect a stool sample to test for C. difficile to see if the infection has cleared or if he may need a second round of treatment

## 2014-05-01 NOTE — Telephone Encounter (Signed)
Jared Lee with [facility] called and lvm stating that pt has completed flagyl  But he is still having loose stools 5-6 x a day please advise he told the [facility] nurses that he WILL go back to the hospital if something is not done. Please advise.Audelia Hives Miles

## 2014-05-02 DIAGNOSIS — J440 Chronic obstructive pulmonary disease with acute lower respiratory infection: Secondary | ICD-10-CM | POA: Diagnosis not present

## 2014-05-02 DIAGNOSIS — I10 Essential (primary) hypertension: Secondary | ICD-10-CM | POA: Diagnosis not present

## 2014-05-02 DIAGNOSIS — I4891 Unspecified atrial fibrillation: Secondary | ICD-10-CM | POA: Diagnosis not present

## 2014-05-02 DIAGNOSIS — E119 Type 2 diabetes mellitus without complications: Secondary | ICD-10-CM | POA: Diagnosis not present

## 2014-05-03 DIAGNOSIS — I10 Essential (primary) hypertension: Secondary | ICD-10-CM | POA: Diagnosis not present

## 2014-05-03 DIAGNOSIS — E119 Type 2 diabetes mellitus without complications: Secondary | ICD-10-CM | POA: Diagnosis not present

## 2014-05-03 DIAGNOSIS — I4891 Unspecified atrial fibrillation: Secondary | ICD-10-CM | POA: Diagnosis not present

## 2014-05-03 DIAGNOSIS — J440 Chronic obstructive pulmonary disease with acute lower respiratory infection: Secondary | ICD-10-CM | POA: Diagnosis not present

## 2014-05-06 DIAGNOSIS — I10 Essential (primary) hypertension: Secondary | ICD-10-CM | POA: Diagnosis not present

## 2014-05-06 DIAGNOSIS — I4891 Unspecified atrial fibrillation: Secondary | ICD-10-CM | POA: Diagnosis not present

## 2014-05-06 DIAGNOSIS — E669 Obesity, unspecified: Secondary | ICD-10-CM | POA: Diagnosis not present

## 2014-05-06 DIAGNOSIS — R197 Diarrhea, unspecified: Secondary | ICD-10-CM | POA: Diagnosis not present

## 2014-05-06 DIAGNOSIS — J440 Chronic obstructive pulmonary disease with acute lower respiratory infection: Secondary | ICD-10-CM | POA: Diagnosis not present

## 2014-05-06 DIAGNOSIS — Z9981 Dependence on supplemental oxygen: Secondary | ICD-10-CM | POA: Diagnosis not present

## 2014-05-06 DIAGNOSIS — R079 Chest pain, unspecified: Secondary | ICD-10-CM | POA: Diagnosis not present

## 2014-05-06 DIAGNOSIS — E119 Type 2 diabetes mellitus without complications: Secondary | ICD-10-CM | POA: Diagnosis not present

## 2014-05-06 DIAGNOSIS — Z7982 Long term (current) use of aspirin: Secondary | ICD-10-CM | POA: Diagnosis not present

## 2014-05-06 DIAGNOSIS — E785 Hyperlipidemia, unspecified: Secondary | ICD-10-CM | POA: Diagnosis not present

## 2014-05-06 DIAGNOSIS — J4489 Other specified chronic obstructive pulmonary disease: Secondary | ICD-10-CM | POA: Diagnosis not present

## 2014-05-06 DIAGNOSIS — I509 Heart failure, unspecified: Secondary | ICD-10-CM | POA: Diagnosis not present

## 2014-05-06 DIAGNOSIS — R0602 Shortness of breath: Secondary | ICD-10-CM | POA: Diagnosis not present

## 2014-05-06 DIAGNOSIS — J449 Chronic obstructive pulmonary disease, unspecified: Secondary | ICD-10-CM | POA: Diagnosis not present

## 2014-05-07 ENCOUNTER — Telehealth: Payer: Self-pay

## 2014-05-07 NOTE — Telephone Encounter (Signed)
Sent fax to Dr Madilyn Fireman for approval.

## 2014-05-07 NOTE — Telephone Encounter (Signed)
I called patient and left a message for him to return call. We received a refill request from [facility] [place], [place] for Ferrous Sulf 325 mg once daily. This medication is not on his medication list. Why does he take this medication/? DX and does he take the medication daily?

## 2014-05-08 DIAGNOSIS — J440 Chronic obstructive pulmonary disease with acute lower respiratory infection: Secondary | ICD-10-CM | POA: Diagnosis not present

## 2014-05-08 DIAGNOSIS — E119 Type 2 diabetes mellitus without complications: Secondary | ICD-10-CM | POA: Diagnosis not present

## 2014-05-08 DIAGNOSIS — I4891 Unspecified atrial fibrillation: Secondary | ICD-10-CM | POA: Diagnosis not present

## 2014-05-08 DIAGNOSIS — I10 Essential (primary) hypertension: Secondary | ICD-10-CM | POA: Diagnosis not present

## 2014-05-09 ENCOUNTER — Other Ambulatory Visit: Payer: Self-pay | Admitting: Family Medicine

## 2014-05-09 ENCOUNTER — Other Ambulatory Visit: Payer: Self-pay

## 2014-05-09 ENCOUNTER — Other Ambulatory Visit: Payer: Self-pay | Admitting: *Deleted

## 2014-05-09 DIAGNOSIS — I10 Essential (primary) hypertension: Secondary | ICD-10-CM

## 2014-05-09 MED ORDER — FUROSEMIDE 40 MG PO TABS
40.0000 mg | ORAL_TABLET | Freq: Every day | ORAL | Status: DC
Start: 2014-05-09 — End: 2015-12-28

## 2014-05-09 MED ORDER — FERROUS SULFATE 325 (65 FE) MG PO TABS: 325.0000 mg | ORAL_TABLET | Freq: Every day | ORAL | Status: DC

## 2014-05-13 ENCOUNTER — Encounter: Payer: Self-pay | Admitting: Family Medicine

## 2014-05-13 ENCOUNTER — Ambulatory Visit (INDEPENDENT_AMBULATORY_CARE_PROVIDER_SITE_OTHER): Payer: Medicare Other | Admitting: Family Medicine

## 2014-05-13 VITALS — BP 140/86 | HR 88 | Temp 98.0°F | Wt 142.0 lb

## 2014-05-13 DIAGNOSIS — E119 Type 2 diabetes mellitus without complications: Secondary | ICD-10-CM

## 2014-05-13 DIAGNOSIS — R197 Diarrhea, unspecified: Secondary | ICD-10-CM

## 2014-05-13 DIAGNOSIS — J449 Chronic obstructive pulmonary disease, unspecified: Secondary | ICD-10-CM

## 2014-05-13 DIAGNOSIS — J4489 Other specified chronic obstructive pulmonary disease: Secondary | ICD-10-CM

## 2014-05-13 DIAGNOSIS — Z23 Encounter for immunization: Secondary | ICD-10-CM

## 2014-05-13 MED ORDER — ALBUTEROL SULFATE (2.5 MG/3ML) 0.083% IN NEBU
2.5000 mg | INHALATION_SOLUTION | Freq: Once | RESPIRATORY_TRACT | Status: AC
  Administered 2014-05-13: 2.5 mg via RESPIRATORY_TRACT

## 2014-05-13 MED ORDER — COLESTIPOL HCL 1 G PO TABS: 1.0000 g | ORAL_TABLET | Freq: Two times a day (BID) | ORAL | Status: DC | PRN

## 2014-05-13 NOTE — Progress Notes (Signed)
   Subjective:    Patient ID: Jared Lee, male    DOB: 1941-07-23, 73 y.o.   MRN: [id number]  HPI Followup diarrhea-he was initially diagnosed with C. difficile. On repeat cultures done about 3 weeks ago they were negative. He still complains of frequent stooling. 5 BMs per day that are soft.  No blood in the stool.  Eats well about twcie a day. Denies any major changes in appetite or nausea. No fevers, chills or sweats.  COPD - he has not had any recent exacerbations. No change in symptoms. His last neb treatment was around noon today which is approximately 3 hours ago. He does wear oxygen but did not wear it in to the office today. He is sob with speech but this is his baseline.  No fever, chills or change in sputum.   DM- last A1C done in hospital was 6.5  On glipizide.   Review of Systems     Objective:   Physical Exam  Constitutional: He is oriented to person, place, and time. He appears well-developed and well-nourished.  HENT:  Head: Normocephalic and atraumatic.  Cardiovascular: Normal rate, regular rhythm and normal heart sounds.   Pulmonary/Chest: Effort normal and breath sounds normal. He has no wheezes.  Prolonged expiration  Abdominal: Soft. Bowel sounds are normal. He exhibits no distension and no mass. There is tenderness. There is no rebound and no guarding.  Mildly tender to palpation the left lower quadrant. No rebound or guarding. No hepatosplenomegaly.  Neurological: He is alert and oriented to person, place, and time.  Skin: Skin is warm and dry.  Psychiatric: He has a normal mood and affect. His behavior is normal.          Assessment & Plan:  Diarrhea - last C. difficile was negative. Awaiting repeat results from home health that was collected last week. If negative then we'll go ahead and treat with Colestid 1 g twice a day as needed to help bind the stools. Stay hydrated.   COPD- will get nebulizer treatment here today in the office. Continue current  regimen. Stable. Complete antibiotic.    DM- well controlled. F/U in 3 mo.

## 2014-05-14 DIAGNOSIS — I10 Essential (primary) hypertension: Secondary | ICD-10-CM | POA: Diagnosis not present

## 2014-05-14 DIAGNOSIS — E119 Type 2 diabetes mellitus without complications: Secondary | ICD-10-CM | POA: Diagnosis not present

## 2014-05-14 DIAGNOSIS — I4891 Unspecified atrial fibrillation: Secondary | ICD-10-CM | POA: Diagnosis not present

## 2014-05-14 DIAGNOSIS — J440 Chronic obstructive pulmonary disease with acute lower respiratory infection: Secondary | ICD-10-CM | POA: Diagnosis not present

## 2014-05-15 DIAGNOSIS — J449 Chronic obstructive pulmonary disease, unspecified: Secondary | ICD-10-CM | POA: Diagnosis not present

## 2014-05-15 DIAGNOSIS — J4489 Other specified chronic obstructive pulmonary disease: Secondary | ICD-10-CM | POA: Diagnosis not present

## 2014-05-15 DIAGNOSIS — J441 Chronic obstructive pulmonary disease with (acute) exacerbation: Secondary | ICD-10-CM | POA: Diagnosis not present

## 2014-05-15 DIAGNOSIS — R0602 Shortness of breath: Secondary | ICD-10-CM | POA: Diagnosis not present

## 2014-05-16 DIAGNOSIS — R197 Diarrhea, unspecified: Secondary | ICD-10-CM | POA: Diagnosis not present

## 2014-05-16 DIAGNOSIS — E109 Type 1 diabetes mellitus without complications: Secondary | ICD-10-CM | POA: Diagnosis not present

## 2014-05-16 DIAGNOSIS — I10 Essential (primary) hypertension: Secondary | ICD-10-CM | POA: Diagnosis not present

## 2014-05-16 DIAGNOSIS — R5381 Other malaise: Secondary | ICD-10-CM | POA: Diagnosis not present

## 2014-05-16 DIAGNOSIS — G4733 Obstructive sleep apnea (adult) (pediatric): Secondary | ICD-10-CM | POA: Diagnosis not present

## 2014-05-16 DIAGNOSIS — L98499 Non-pressure chronic ulcer of skin of other sites with unspecified severity: Secondary | ICD-10-CM | POA: Diagnosis not present

## 2014-05-16 DIAGNOSIS — E119 Type 2 diabetes mellitus without complications: Secondary | ICD-10-CM | POA: Diagnosis not present

## 2014-05-16 DIAGNOSIS — J441 Chronic obstructive pulmonary disease with (acute) exacerbation: Secondary | ICD-10-CM | POA: Diagnosis not present

## 2014-05-16 DIAGNOSIS — J449 Chronic obstructive pulmonary disease, unspecified: Secondary | ICD-10-CM | POA: Diagnosis not present

## 2014-05-16 DIAGNOSIS — J9610 Chronic respiratory failure, unspecified whether with hypoxia or hypercapnia: Secondary | ICD-10-CM | POA: Diagnosis present

## 2014-05-16 DIAGNOSIS — H9190 Unspecified hearing loss, unspecified ear: Secondary | ICD-10-CM | POA: Diagnosis present

## 2014-05-16 DIAGNOSIS — M6281 Muscle weakness (generalized): Secondary | ICD-10-CM | POA: Diagnosis not present

## 2014-05-16 DIAGNOSIS — R0602 Shortness of breath: Secondary | ICD-10-CM | POA: Diagnosis not present

## 2014-05-16 DIAGNOSIS — R0902 Hypoxemia: Secondary | ICD-10-CM | POA: Diagnosis not present

## 2014-05-16 DIAGNOSIS — Z79899 Other long term (current) drug therapy: Secondary | ICD-10-CM | POA: Diagnosis not present

## 2014-05-16 DIAGNOSIS — I4891 Unspecified atrial fibrillation: Secondary | ICD-10-CM | POA: Diagnosis not present

## 2014-05-16 DIAGNOSIS — Z9981 Dependence on supplemental oxygen: Secondary | ICD-10-CM | POA: Diagnosis not present

## 2014-05-19 DIAGNOSIS — I4891 Unspecified atrial fibrillation: Secondary | ICD-10-CM | POA: Diagnosis not present

## 2014-05-19 DIAGNOSIS — J440 Chronic obstructive pulmonary disease with acute lower respiratory infection: Secondary | ICD-10-CM | POA: Diagnosis not present

## 2014-05-19 DIAGNOSIS — E119 Type 2 diabetes mellitus without complications: Secondary | ICD-10-CM | POA: Diagnosis not present

## 2014-05-19 DIAGNOSIS — I10 Essential (primary) hypertension: Secondary | ICD-10-CM | POA: Diagnosis not present

## 2014-05-20 ENCOUNTER — Other Ambulatory Visit: Payer: Self-pay | Admitting: Family Medicine

## 2014-05-21 DIAGNOSIS — E119 Type 2 diabetes mellitus without complications: Secondary | ICD-10-CM | POA: Diagnosis not present

## 2014-05-21 DIAGNOSIS — J440 Chronic obstructive pulmonary disease with acute lower respiratory infection: Secondary | ICD-10-CM | POA: Diagnosis not present

## 2014-05-21 DIAGNOSIS — I10 Essential (primary) hypertension: Secondary | ICD-10-CM | POA: Diagnosis not present

## 2014-05-21 DIAGNOSIS — I4891 Unspecified atrial fibrillation: Secondary | ICD-10-CM | POA: Diagnosis not present

## 2014-05-23 ENCOUNTER — Telehealth: Payer: Self-pay

## 2014-05-23 DIAGNOSIS — I10 Essential (primary) hypertension: Secondary | ICD-10-CM | POA: Diagnosis not present

## 2014-05-23 DIAGNOSIS — J440 Chronic obstructive pulmonary disease with acute lower respiratory infection: Secondary | ICD-10-CM | POA: Diagnosis not present

## 2014-05-23 DIAGNOSIS — I4891 Unspecified atrial fibrillation: Secondary | ICD-10-CM | POA: Diagnosis not present

## 2014-05-23 DIAGNOSIS — E119 Type 2 diabetes mellitus without complications: Secondary | ICD-10-CM | POA: Diagnosis not present

## 2014-05-23 MED ORDER — DOXYCYCLINE HYCLATE 100 MG PO CAPS: 100.0000 mg | ORAL_CAPSULE | Freq: Two times a day (BID) | ORAL | Status: DC

## 2014-05-23 NOTE — Telephone Encounter (Signed)
[facility] nurse, [name], called:  She reports patient has a productive cough with green sputum. Denies fever, chills, sweats, runny nose or pressure in face. His o2 is 94 % off oxygen.  She also reports he is still having diarrhea. 3 times today and 5 times yesterday. He is taking the Colestid one tablet bid. Denies vomiting.     Chong Sicilian, [place] [phone]

## 2014-05-23 NOTE — Telephone Encounter (Signed)
Ok I will call in an antibiotic.Ok to increase colestid to TID. Have him consider referral to GI.

## 2014-05-24 DIAGNOSIS — I4891 Unspecified atrial fibrillation: Secondary | ICD-10-CM | POA: Diagnosis not present

## 2014-05-24 DIAGNOSIS — I10 Essential (primary) hypertension: Secondary | ICD-10-CM | POA: Diagnosis not present

## 2014-05-24 DIAGNOSIS — E119 Type 2 diabetes mellitus without complications: Secondary | ICD-10-CM | POA: Diagnosis not present

## 2014-05-24 DIAGNOSIS — J440 Chronic obstructive pulmonary disease with acute lower respiratory infection: Secondary | ICD-10-CM | POA: Diagnosis not present

## 2014-05-24 NOTE — Telephone Encounter (Signed)
I called [name], home health, and advised her of Dr Gardiner Ramus recommendations. She asked if I would call patient and advise him. I did call and leave a message with the patient. She also states she has to discharge patient due to the fact he is not truly home bound.

## 2014-05-24 NOTE — Telephone Encounter (Signed)
Left a detailed message for Mr Holquin at his home number.

## 2014-05-27 ENCOUNTER — Ambulatory Visit: Payer: Medicare Other | Admitting: Family Medicine

## 2014-05-27 ENCOUNTER — Other Ambulatory Visit: Payer: Self-pay

## 2014-05-27 MED ORDER — ALBUTEROL SULFATE (2.5 MG/3ML) 0.083% IN NEBU: INHALATION_SOLUTION | RESPIRATORY_TRACT | Status: DC

## 2014-05-29 DIAGNOSIS — I5030 Unspecified diastolic (congestive) heart failure: Secondary | ICD-10-CM | POA: Diagnosis present

## 2014-05-29 DIAGNOSIS — F419 Anxiety disorder, unspecified: Secondary | ICD-10-CM | POA: Diagnosis present

## 2014-05-29 DIAGNOSIS — I4891 Unspecified atrial fibrillation: Secondary | ICD-10-CM | POA: Diagnosis present

## 2014-05-29 DIAGNOSIS — J4489 Other specified chronic obstructive pulmonary disease: Secondary | ICD-10-CM | POA: Diagnosis not present

## 2014-05-29 DIAGNOSIS — I471 Supraventricular tachycardia: Secondary | ICD-10-CM | POA: Diagnosis not present

## 2014-05-29 DIAGNOSIS — H9190 Unspecified hearing loss, unspecified ear: Secondary | ICD-10-CM | POA: Diagnosis present

## 2014-05-29 DIAGNOSIS — Z79899 Other long term (current) drug therapy: Secondary | ICD-10-CM | POA: Diagnosis not present

## 2014-05-29 DIAGNOSIS — N179 Acute kidney failure, unspecified: Secondary | ICD-10-CM | POA: Diagnosis not present

## 2014-05-29 DIAGNOSIS — R0602 Shortness of breath: Secondary | ICD-10-CM | POA: Diagnosis not present

## 2014-05-29 DIAGNOSIS — J9620 Acute and chronic respiratory failure, unspecified whether with hypoxia or hypercapnia: Secondary | ICD-10-CM | POA: Diagnosis not present

## 2014-05-29 DIAGNOSIS — M47812 Spondylosis without myelopathy or radiculopathy, cervical region: Secondary | ICD-10-CM | POA: Diagnosis not present

## 2014-05-29 DIAGNOSIS — J209 Acute bronchitis, unspecified: Secondary | ICD-10-CM | POA: Diagnosis not present

## 2014-05-29 DIAGNOSIS — J440 Chronic obstructive pulmonary disease with acute lower respiratory infection: Secondary | ICD-10-CM | POA: Diagnosis not present

## 2014-05-29 DIAGNOSIS — J449 Chronic obstructive pulmonary disease, unspecified: Secondary | ICD-10-CM | POA: Diagnosis not present

## 2014-05-29 DIAGNOSIS — R079 Chest pain, unspecified: Secondary | ICD-10-CM | POA: Diagnosis not present

## 2014-05-29 DIAGNOSIS — A419 Sepsis, unspecified organism: Secondary | ICD-10-CM | POA: Diagnosis present

## 2014-05-29 DIAGNOSIS — Z7982 Long term (current) use of aspirin: Secondary | ICD-10-CM | POA: Diagnosis not present

## 2014-05-29 DIAGNOSIS — R0902 Hypoxemia: Secondary | ICD-10-CM | POA: Diagnosis not present

## 2014-05-29 DIAGNOSIS — E119 Type 2 diabetes mellitus without complications: Secondary | ICD-10-CM | POA: Diagnosis present

## 2014-05-29 DIAGNOSIS — J441 Chronic obstructive pulmonary disease with (acute) exacerbation: Secondary | ICD-10-CM | POA: Diagnosis not present

## 2014-05-29 DIAGNOSIS — Z9981 Dependence on supplemental oxygen: Secondary | ICD-10-CM | POA: Diagnosis not present

## 2014-05-29 DIAGNOSIS — A047 Enterocolitis due to Clostridium difficile: Secondary | ICD-10-CM | POA: Diagnosis present

## 2014-05-29 DIAGNOSIS — I10 Essential (primary) hypertension: Secondary | ICD-10-CM | POA: Diagnosis present

## 2014-05-29 DIAGNOSIS — R197 Diarrhea, unspecified: Secondary | ICD-10-CM | POA: Diagnosis not present

## 2014-05-29 DIAGNOSIS — D72828 Other elevated white blood cell count: Secondary | ICD-10-CM | POA: Diagnosis not present

## 2014-05-29 DIAGNOSIS — E118 Type 2 diabetes mellitus with unspecified complications: Secondary | ICD-10-CM | POA: Diagnosis not present

## 2014-06-05 ENCOUNTER — Ambulatory Visit: Payer: Medicare Other | Admitting: Family Medicine

## 2014-06-05 DIAGNOSIS — A047 Enterocolitis due to Clostridium difficile: Secondary | ICD-10-CM | POA: Diagnosis not present

## 2014-06-05 DIAGNOSIS — E119 Type 2 diabetes mellitus without complications: Secondary | ICD-10-CM | POA: Diagnosis not present

## 2014-06-05 DIAGNOSIS — I10 Essential (primary) hypertension: Secondary | ICD-10-CM | POA: Diagnosis not present

## 2014-06-05 DIAGNOSIS — I5030 Unspecified diastolic (congestive) heart failure: Secondary | ICD-10-CM | POA: Diagnosis not present

## 2014-06-05 DIAGNOSIS — J441 Chronic obstructive pulmonary disease with (acute) exacerbation: Secondary | ICD-10-CM | POA: Diagnosis not present

## 2014-06-07 DIAGNOSIS — I5030 Unspecified diastolic (congestive) heart failure: Secondary | ICD-10-CM | POA: Diagnosis not present

## 2014-06-07 DIAGNOSIS — J441 Chronic obstructive pulmonary disease with (acute) exacerbation: Secondary | ICD-10-CM | POA: Diagnosis not present

## 2014-06-07 DIAGNOSIS — E119 Type 2 diabetes mellitus without complications: Secondary | ICD-10-CM | POA: Diagnosis not present

## 2014-06-07 DIAGNOSIS — A047 Enterocolitis due to Clostridium difficile: Secondary | ICD-10-CM | POA: Diagnosis not present

## 2014-06-07 DIAGNOSIS — I10 Essential (primary) hypertension: Secondary | ICD-10-CM | POA: Diagnosis not present

## 2014-06-11 DIAGNOSIS — I10 Essential (primary) hypertension: Secondary | ICD-10-CM | POA: Diagnosis not present

## 2014-06-11 DIAGNOSIS — E119 Type 2 diabetes mellitus without complications: Secondary | ICD-10-CM | POA: Diagnosis not present

## 2014-06-11 DIAGNOSIS — I5030 Unspecified diastolic (congestive) heart failure: Secondary | ICD-10-CM | POA: Diagnosis not present

## 2014-06-11 DIAGNOSIS — A047 Enterocolitis due to Clostridium difficile: Secondary | ICD-10-CM | POA: Diagnosis not present

## 2014-06-11 DIAGNOSIS — J441 Chronic obstructive pulmonary disease with (acute) exacerbation: Secondary | ICD-10-CM | POA: Diagnosis not present

## 2014-06-13 DIAGNOSIS — I5030 Unspecified diastolic (congestive) heart failure: Secondary | ICD-10-CM | POA: Diagnosis not present

## 2014-06-13 DIAGNOSIS — E119 Type 2 diabetes mellitus without complications: Secondary | ICD-10-CM | POA: Diagnosis not present

## 2014-06-13 DIAGNOSIS — I10 Essential (primary) hypertension: Secondary | ICD-10-CM | POA: Diagnosis not present

## 2014-06-13 DIAGNOSIS — A047 Enterocolitis due to Clostridium difficile: Secondary | ICD-10-CM | POA: Diagnosis not present

## 2014-06-13 DIAGNOSIS — J441 Chronic obstructive pulmonary disease with (acute) exacerbation: Secondary | ICD-10-CM | POA: Diagnosis not present

## 2014-06-19 DIAGNOSIS — I10 Essential (primary) hypertension: Secondary | ICD-10-CM | POA: Diagnosis not present

## 2014-06-19 DIAGNOSIS — E119 Type 2 diabetes mellitus without complications: Secondary | ICD-10-CM | POA: Diagnosis not present

## 2014-06-19 DIAGNOSIS — J441 Chronic obstructive pulmonary disease with (acute) exacerbation: Secondary | ICD-10-CM | POA: Diagnosis not present

## 2014-06-19 DIAGNOSIS — A047 Enterocolitis due to Clostridium difficile: Secondary | ICD-10-CM | POA: Diagnosis not present

## 2014-06-19 DIAGNOSIS — I5030 Unspecified diastolic (congestive) heart failure: Secondary | ICD-10-CM | POA: Diagnosis not present

## 2014-06-24 ENCOUNTER — Encounter: Payer: Self-pay | Admitting: Family Medicine

## 2014-06-24 ENCOUNTER — Ambulatory Visit (INDEPENDENT_AMBULATORY_CARE_PROVIDER_SITE_OTHER): Payer: Medicare Other | Admitting: Family Medicine

## 2014-06-24 VITALS — BP 110/66 | HR 81 | Temp 97.5°F | Ht 71.0 in | Wt 146.0 lb

## 2014-06-24 DIAGNOSIS — J441 Chronic obstructive pulmonary disease with (acute) exacerbation: Secondary | ICD-10-CM | POA: Diagnosis not present

## 2014-06-24 DIAGNOSIS — E099 Drug or chemical induced diabetes mellitus without complications: Secondary | ICD-10-CM

## 2014-06-24 DIAGNOSIS — R197 Diarrhea, unspecified: Secondary | ICD-10-CM

## 2014-06-24 MED ORDER — BUDESONIDE-FORMOTEROL FUMARATE 160-4.5 MCG/ACT IN AERO: 2.0000 | INHALATION_SPRAY | Freq: Two times a day (BID) | RESPIRATORY_TRACT | Status: DC

## 2014-06-24 MED ORDER — ALBUTEROL SULFATE (2.5 MG/3ML) 0.083% IN NEBU
2.5000 mg | INHALATION_SOLUTION | Freq: Once | RESPIRATORY_TRACT | Status: AC
  Administered 2014-06-24: 2.5 mg via RESPIRATORY_TRACT

## 2014-06-24 NOTE — Progress Notes (Signed)
   Subjective:    Patient ID: Jared Lee, male    DOB: Jan 02, 1941, 73 y.o.   MRN: [id number]  HPI COPD- using alubterol every 4 hours.  Running short on the albuterol now using every 4 hours.  No blood in the sputum. He has significant increased shortness of breath about 2 days ago. His son-in-law he was here with him today says he started him on prednisone and gave him the medication for 3 days. It seemed to really help his breathing and did not cause a lower extremity swelling that he often gets with typical courses of prednisone. He is also on Spiriva. He is not currently on an inhaled steroid. It is not clear to me if this was stopped during his recent hospitalization or not.  Diarrhea - Has started a probiotic and he is doing well.  He is feeling better.  No longer needing the colestipol   Diabetes - he is not lcear if he has diabetes or not.  Review of Systems     Objective:   Physical Exam  Constitutional: He is oriented to person, place, and time. He appears well-developed and well-nourished.  HENT:  Head: Normocephalic and atraumatic.  Cardiovascular: Normal rate, regular rhythm and normal heart sounds.   Pulmonary/Chest: Effort normal and breath sounds normal.  Neurological: He is alert and oriented to person, place, and time.  Skin: Skin is warm and dry.  Psychiatric: He has a normal mood and affect. His behavior is normal.          Assessment & Plan:  COPD stable his last hospitalization was about 4 weeks ago. He is not on an inhaled corticosteroid. He was at one time but not anymore. I think we need to add this back to [facility] therapy.  Ok to use steroid bursts to control symptoms. If has increased sputum production and color change then needs an appt to be assessed for antibiotics.  Wears continue oxygen.  Will symbicort to his regimen.    Diarrhea -seems to have resolved after starting a probiotic. They have been secondary to multiple courses of antibiotics. He is no  longer needing to take the colestipol. I will leave it on his medication list for now.  Diabetes - Discussed dx. He does have it and we discussed that I feel this is due to her use of steroids frequently over the last few years. He is on glipizide for this. Needs f/u in 1-2 months.  Lab Results  Component Value Date   HGBA1C 6.5* 04/23/2014

## 2014-06-25 ENCOUNTER — Encounter: Payer: Self-pay | Admitting: Family Medicine

## 2014-06-25 DIAGNOSIS — E099 Drug or chemical induced diabetes mellitus without complications: Secondary | ICD-10-CM | POA: Insufficient documentation

## 2014-06-26 ENCOUNTER — Other Ambulatory Visit: Payer: Self-pay | Admitting: *Deleted

## 2014-06-26 ENCOUNTER — Other Ambulatory Visit: Payer: Self-pay | Admitting: Family Medicine

## 2014-06-26 MED ORDER — ALBUTEROL SULFATE (2.5 MG/3ML) 0.083% IN NEBU: INHALATION_SOLUTION | RESPIRATORY_TRACT | Status: DC

## 2014-07-04 ENCOUNTER — Other Ambulatory Visit: Payer: Self-pay | Admitting: Family Medicine

## 2014-07-05 ENCOUNTER — Other Ambulatory Visit: Payer: Self-pay | Admitting: Family Medicine

## 2014-07-05 NOTE — Telephone Encounter (Signed)
Is Geramy still using these inhalers? Please advise they were sent for refill. Margette Fast, CMA

## 2014-07-08 DIAGNOSIS — H1132 Conjunctival hemorrhage, left eye: Secondary | ICD-10-CM | POA: Diagnosis not present

## 2014-07-08 DIAGNOSIS — H9190 Unspecified hearing loss, unspecified ear: Secondary | ICD-10-CM | POA: Diagnosis present

## 2014-07-08 DIAGNOSIS — Z79899 Other long term (current) drug therapy: Secondary | ICD-10-CM | POA: Diagnosis not present

## 2014-07-08 DIAGNOSIS — I5030 Unspecified diastolic (congestive) heart failure: Secondary | ICD-10-CM | POA: Diagnosis not present

## 2014-07-08 DIAGNOSIS — I10 Essential (primary) hypertension: Secondary | ICD-10-CM | POA: Diagnosis not present

## 2014-07-08 DIAGNOSIS — R0602 Shortness of breath: Secondary | ICD-10-CM | POA: Diagnosis not present

## 2014-07-08 DIAGNOSIS — A047 Enterocolitis due to Clostridium difficile: Secondary | ICD-10-CM | POA: Diagnosis not present

## 2014-07-08 DIAGNOSIS — E118 Type 2 diabetes mellitus with unspecified complications: Secondary | ICD-10-CM | POA: Diagnosis not present

## 2014-07-08 DIAGNOSIS — I129 Hypertensive chronic kidney disease with stage 1 through stage 4 chronic kidney disease, or unspecified chronic kidney disease: Secondary | ICD-10-CM | POA: Diagnosis present

## 2014-07-08 DIAGNOSIS — Z9981 Dependence on supplemental oxygen: Secondary | ICD-10-CM | POA: Diagnosis not present

## 2014-07-08 DIAGNOSIS — J441 Chronic obstructive pulmonary disease with (acute) exacerbation: Secondary | ICD-10-CM | POA: Diagnosis not present

## 2014-07-08 DIAGNOSIS — R5381 Other malaise: Secondary | ICD-10-CM | POA: Diagnosis present

## 2014-07-08 DIAGNOSIS — N189 Chronic kidney disease, unspecified: Secondary | ICD-10-CM | POA: Diagnosis present

## 2014-07-08 DIAGNOSIS — J984 Other disorders of lung: Secondary | ICD-10-CM | POA: Diagnosis not present

## 2014-07-08 DIAGNOSIS — J9610 Chronic respiratory failure, unspecified whether with hypoxia or hypercapnia: Secondary | ICD-10-CM | POA: Diagnosis not present

## 2014-07-08 DIAGNOSIS — N179 Acute kidney failure, unspecified: Secondary | ICD-10-CM | POA: Diagnosis not present

## 2014-07-08 DIAGNOSIS — E119 Type 2 diabetes mellitus without complications: Secondary | ICD-10-CM | POA: Diagnosis present

## 2014-07-08 DIAGNOSIS — I480 Paroxysmal atrial fibrillation: Secondary | ICD-10-CM | POA: Diagnosis present

## 2014-07-09 ENCOUNTER — Telehealth: Payer: Self-pay | Admitting: *Deleted

## 2014-07-09 NOTE — Telephone Encounter (Signed)
Sent fax for pt's hospital A&D .Audelia Hives [place]

## 2014-07-13 DIAGNOSIS — I5030 Unspecified diastolic (congestive) heart failure: Secondary | ICD-10-CM | POA: Diagnosis not present

## 2014-07-13 DIAGNOSIS — J441 Chronic obstructive pulmonary disease with (acute) exacerbation: Secondary | ICD-10-CM | POA: Diagnosis not present

## 2014-07-13 DIAGNOSIS — E119 Type 2 diabetes mellitus without complications: Secondary | ICD-10-CM | POA: Diagnosis not present

## 2014-07-13 DIAGNOSIS — I10 Essential (primary) hypertension: Secondary | ICD-10-CM | POA: Diagnosis not present

## 2014-07-13 DIAGNOSIS — A047 Enterocolitis due to Clostridium difficile: Secondary | ICD-10-CM | POA: Diagnosis not present

## 2014-07-16 DIAGNOSIS — A047 Enterocolitis due to Clostridium difficile: Secondary | ICD-10-CM | POA: Diagnosis not present

## 2014-07-16 DIAGNOSIS — I5030 Unspecified diastolic (congestive) heart failure: Secondary | ICD-10-CM | POA: Diagnosis not present

## 2014-07-16 DIAGNOSIS — E119 Type 2 diabetes mellitus without complications: Secondary | ICD-10-CM | POA: Diagnosis not present

## 2014-07-16 DIAGNOSIS — J441 Chronic obstructive pulmonary disease with (acute) exacerbation: Secondary | ICD-10-CM | POA: Diagnosis not present

## 2014-07-16 DIAGNOSIS — I10 Essential (primary) hypertension: Secondary | ICD-10-CM | POA: Diagnosis not present

## 2014-07-18 ENCOUNTER — Ambulatory Visit: Payer: Medicare Other | Admitting: Family Medicine

## 2014-07-18 DIAGNOSIS — I10 Essential (primary) hypertension: Secondary | ICD-10-CM | POA: Diagnosis not present

## 2014-07-18 DIAGNOSIS — E119 Type 2 diabetes mellitus without complications: Secondary | ICD-10-CM | POA: Diagnosis not present

## 2014-07-18 DIAGNOSIS — J441 Chronic obstructive pulmonary disease with (acute) exacerbation: Secondary | ICD-10-CM | POA: Diagnosis not present

## 2014-07-18 DIAGNOSIS — I5030 Unspecified diastolic (congestive) heart failure: Secondary | ICD-10-CM | POA: Diagnosis not present

## 2014-07-18 DIAGNOSIS — A047 Enterocolitis due to Clostridium difficile: Secondary | ICD-10-CM | POA: Diagnosis not present

## 2014-07-22 DIAGNOSIS — E119 Type 2 diabetes mellitus without complications: Secondary | ICD-10-CM | POA: Diagnosis not present

## 2014-07-22 DIAGNOSIS — I10 Essential (primary) hypertension: Secondary | ICD-10-CM | POA: Diagnosis not present

## 2014-07-22 DIAGNOSIS — J441 Chronic obstructive pulmonary disease with (acute) exacerbation: Secondary | ICD-10-CM | POA: Diagnosis not present

## 2014-07-22 DIAGNOSIS — A047 Enterocolitis due to Clostridium difficile: Secondary | ICD-10-CM | POA: Diagnosis not present

## 2014-07-22 DIAGNOSIS — I5030 Unspecified diastolic (congestive) heart failure: Secondary | ICD-10-CM | POA: Diagnosis not present

## 2014-07-24 DIAGNOSIS — D696 Thrombocytopenia, unspecified: Secondary | ICD-10-CM | POA: Diagnosis present

## 2014-07-24 DIAGNOSIS — J441 Chronic obstructive pulmonary disease with (acute) exacerbation: Secondary | ICD-10-CM | POA: Diagnosis not present

## 2014-07-24 DIAGNOSIS — R0602 Shortness of breath: Secondary | ICD-10-CM | POA: Diagnosis not present

## 2014-07-24 DIAGNOSIS — E119 Type 2 diabetes mellitus without complications: Secondary | ICD-10-CM | POA: Diagnosis not present

## 2014-07-24 DIAGNOSIS — I480 Paroxysmal atrial fibrillation: Secondary | ICD-10-CM | POA: Diagnosis present

## 2014-07-24 DIAGNOSIS — J9600 Acute respiratory failure, unspecified whether with hypoxia or hypercapnia: Secondary | ICD-10-CM | POA: Diagnosis not present

## 2014-07-24 DIAGNOSIS — A047 Enterocolitis due to Clostridium difficile: Secondary | ICD-10-CM | POA: Diagnosis not present

## 2014-07-24 DIAGNOSIS — E440 Moderate protein-calorie malnutrition: Secondary | ICD-10-CM | POA: Diagnosis not present

## 2014-07-24 DIAGNOSIS — G9341 Metabolic encephalopathy: Secondary | ICD-10-CM | POA: Diagnosis not present

## 2014-07-24 DIAGNOSIS — D649 Anemia, unspecified: Secondary | ICD-10-CM | POA: Diagnosis not present

## 2014-07-24 DIAGNOSIS — J449 Chronic obstructive pulmonary disease, unspecified: Secondary | ICD-10-CM | POA: Diagnosis not present

## 2014-07-24 DIAGNOSIS — Z87891 Personal history of nicotine dependence: Secondary | ICD-10-CM | POA: Diagnosis not present

## 2014-07-24 DIAGNOSIS — I959 Hypotension, unspecified: Secondary | ICD-10-CM | POA: Diagnosis not present

## 2014-07-24 DIAGNOSIS — Z79899 Other long term (current) drug therapy: Secondary | ICD-10-CM | POA: Diagnosis not present

## 2014-07-24 DIAGNOSIS — R4182 Altered mental status, unspecified: Secondary | ICD-10-CM | POA: Diagnosis not present

## 2014-07-24 DIAGNOSIS — Z9981 Dependence on supplemental oxygen: Secondary | ICD-10-CM | POA: Diagnosis not present

## 2014-07-24 DIAGNOSIS — Z681 Body mass index (BMI) 19 or less, adult: Secondary | ICD-10-CM | POA: Diagnosis not present

## 2014-07-24 DIAGNOSIS — Z7982 Long term (current) use of aspirin: Secondary | ICD-10-CM | POA: Diagnosis not present

## 2014-07-24 DIAGNOSIS — H9190 Unspecified hearing loss, unspecified ear: Secondary | ICD-10-CM | POA: Diagnosis present

## 2014-07-24 DIAGNOSIS — I5030 Unspecified diastolic (congestive) heart failure: Secondary | ICD-10-CM | POA: Diagnosis not present

## 2014-07-24 DIAGNOSIS — D638 Anemia in other chronic diseases classified elsewhere: Secondary | ICD-10-CM | POA: Diagnosis present

## 2014-07-24 DIAGNOSIS — J9621 Acute and chronic respiratory failure with hypoxia: Secondary | ICD-10-CM | POA: Diagnosis not present

## 2014-07-24 DIAGNOSIS — J9690 Respiratory failure, unspecified, unspecified whether with hypoxia or hypercapnia: Secondary | ICD-10-CM | POA: Diagnosis not present

## 2014-07-24 DIAGNOSIS — I10 Essential (primary) hypertension: Secondary | ICD-10-CM | POA: Diagnosis not present

## 2014-07-30 DIAGNOSIS — I5030 Unspecified diastolic (congestive) heart failure: Secondary | ICD-10-CM | POA: Diagnosis not present

## 2014-07-30 DIAGNOSIS — I10 Essential (primary) hypertension: Secondary | ICD-10-CM | POA: Diagnosis not present

## 2014-07-30 DIAGNOSIS — E119 Type 2 diabetes mellitus without complications: Secondary | ICD-10-CM | POA: Diagnosis not present

## 2014-07-30 DIAGNOSIS — A047 Enterocolitis due to Clostridium difficile: Secondary | ICD-10-CM | POA: Diagnosis not present

## 2014-07-30 DIAGNOSIS — J441 Chronic obstructive pulmonary disease with (acute) exacerbation: Secondary | ICD-10-CM | POA: Diagnosis not present

## 2014-08-01 DIAGNOSIS — E119 Type 2 diabetes mellitus without complications: Secondary | ICD-10-CM | POA: Diagnosis not present

## 2014-08-01 DIAGNOSIS — A047 Enterocolitis due to Clostridium difficile: Secondary | ICD-10-CM | POA: Diagnosis not present

## 2014-08-01 DIAGNOSIS — I5030 Unspecified diastolic (congestive) heart failure: Secondary | ICD-10-CM | POA: Diagnosis not present

## 2014-08-01 DIAGNOSIS — J441 Chronic obstructive pulmonary disease with (acute) exacerbation: Secondary | ICD-10-CM | POA: Diagnosis not present

## 2014-08-01 DIAGNOSIS — I10 Essential (primary) hypertension: Secondary | ICD-10-CM | POA: Diagnosis not present

## 2014-08-05 ENCOUNTER — Encounter: Payer: Self-pay | Admitting: Family Medicine

## 2014-08-05 ENCOUNTER — Ambulatory Visit (INDEPENDENT_AMBULATORY_CARE_PROVIDER_SITE_OTHER): Payer: Medicare Other | Admitting: Family Medicine

## 2014-08-05 VITALS — BP 129/76 | HR 85 | Temp 97.8°F | Wt 138.0 lb

## 2014-08-05 DIAGNOSIS — E099 Drug or chemical induced diabetes mellitus without complications: Secondary | ICD-10-CM

## 2014-08-05 DIAGNOSIS — R197 Diarrhea, unspecified: Secondary | ICD-10-CM | POA: Diagnosis not present

## 2014-08-05 DIAGNOSIS — J441 Chronic obstructive pulmonary disease with (acute) exacerbation: Secondary | ICD-10-CM

## 2014-08-05 LAB — POCT GLYCOSYLATED HEMOGLOBIN (HGB A1C): Hemoglobin A1C: 6.1

## 2014-08-05 MED ORDER — ALBUTEROL SULFATE (2.5 MG/3ML) 0.083% IN NEBU: 2.5000 mg | INHALATION_SOLUTION | Freq: Once | RESPIRATORY_TRACT | Status: DC

## 2014-08-05 MED ORDER — ACIDOPHILUS PROBIOTIC COMPLEX PO TABS: 1.0000 | ORAL_TABLET | Freq: Every day | ORAL | Status: DC

## 2014-08-05 MED ORDER — TIOTROPIUM BROMIDE MONOHYDRATE 18 MCG IN CAPS: ORAL_CAPSULE | RESPIRATORY_TRACT | Status: DC

## 2014-08-05 NOTE — Patient Instructions (Signed)
Hemoglobin A1c Test The hemoglobin (HbA1c) test measures the average amount of sugar (glucose) in your blood during the 2-3 months just before the test (rather than measuring the current amount of glucose in the sample of blood). Some of the glucose that circulates in your blood binds to blood proteins. Hemoglobin (Hb) is a blood protein that carries oxygen in the red blood cells (RBCs). When glucose binds to Hb, the glucose-coated Hb is called glycated Hb. Once Hb is glycated, it remains that way for the life of the RBC (about 120 days). The HbA1c test is used to diagnose diabetes mellitus and to monitor long-term control of blood sugar in people who have diabetes mellitus. The HbA1c test can be used in place of or in combination with fasting blood glucose level and oral glucose tolerance tests. There are several methods for measuring the concentration of HbA1c in the blood. One method uses antibodies that bind specifically to HbA1c. A lab instrument then measures the concentration of bound antibodies in a sample. A second method called ion exchange high-pressure liquid chromatography separates HbA1c from other types of Hb on the basis of differences in electrical charge. A third method for measuring HbA1c uses enzymes that react specifically with the glucose on HbA1c to produce a measurable color change. RESULTS  It is your responsibility to obtain your test results. Ask the lab or department performing the test when and how you will get your results. Contact your health care provider to discuss any questions you have about your results.  Range of Normal Values Ranges for normal values may vary among different labs and hospitals. You should always check with your health care provider after having lab work or other tests done to discuss the meaning of your test results and whether your values are considered within normal limits. The range for normal HbA1c test results is from 4.5% to less than 5.7%. Meaning  of Results Outside Normal Value Ranges Abnormally high HbA1c values are most commonly an indication of prediabetes mellitus and diabetes mellitus:  An HbA1c result of 5.7-6.4% is considered diagnostic of prediabetes mellitus.  An HbA1c result of 6.5% or higher on two separate occasions is considered diagnostic of diabetes mellitus. There are certain conditions that can cause a falsely low HbA1c test result. These conditions include pregnancy, blood loss, blood transfusions, anemia caused by premature destruction of red blood cells (hemolytic anemia), and certain unusual forms of Hb (Hb variants), such as sickle cell trait. There are certain conditions that can cause a falsely low HbA1c test result. These conditions include iron deficiency, kidney failure, and certain Hb variants. Document Released: 09/07/2004 Document Revised: 12/31/2013 Document Reviewed: 07/13/2012 Exit[employer] Patient Information 2015 [employer]. This information is not intended to replace advice given to you by your health care provider. Make sure you discuss any questions you have with your health care provider.  

## 2014-08-05 NOTE — Progress Notes (Signed)
   Subjective:    Patient ID: Jared Lee, male    DOB: 08-Jul-1941, 73 y.o.   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 No wounds or sores that are not healing well. No increased thirst or urination. Checking glucose at home. Taking medications as prescribed without any side effects.  COPD, 6 week follow-up - currently on Symbicort and Spiriva. Primarily uses his nebulizer at home 3-4 times per day. Last hospitalized in early November and then again about 1.5 weeks ago. Says. Using alubterol every 4 hours. On prednisone once daily.  No fever, chills.  No color changein sputum.    Diarrhea-probiotic seems to be helping. He will significant come in a prescription version that might be less expensive with his insurance.  His son-in-law is here today for the office visit as well.  Review of Systems     Objective:   Physical Exam  Constitutional: He is oriented to person, place, and time. He appears well-developed and well-nourished.  HENT:  Head: Normocephalic and atraumatic.  Right Ear: External ear normal.  Left Ear: External ear normal.  Nose: Nose normal.  Mouth/Throat: Oropharynx is clear and moist.  TMs and canals are clear.   Eyes: Conjunctivae and EOM are normal. Pupils are equal, round, and reactive to light.  Neck: Neck supple. No thyromegaly present.  Cardiovascular: Normal rate and normal heart sounds.   Pulmonary/Chest: Effort normal and breath sounds normal.  Lymphadenopathy:    He has no cervical adenopathy.  Neurological: He is alert and oriented to person, place, and time.  Skin: Skin is warm and dry.  Psychiatric: He has a normal mood and affect.          Assessment & Plan:  Diabetes-A1c is down to 6.1 today from previous of 6.5. Well controlled. Continue current regimen. He is on ACE inhibitor. Continue glipizide. He has not had any hypoglycemic events on this. Follow up in 3 months. Intolerant to  metformin.  COPD-not well controlled. Unfortunately he is having 2-3 exacerbations per month. He refuses to see pulmonology but his therapy is maximized and he's on a burst of steroids right now. Recording get records from the second hospitalization November. We have received information from the first hospitalization.  Diarrhea - will write a Rx for the probiotic.  Have her be cheaper and more cost effective for write it is a prescription but it does seem to make a difference as far as his loose bowel movements especially with recurrent use of antibiotics for COPD exacerbations.

## 2014-08-06 ENCOUNTER — Encounter: Payer: Self-pay | Admitting: Family Medicine

## 2014-08-08 ENCOUNTER — Encounter: Payer: Self-pay | Admitting: Family Medicine

## 2014-08-11 DIAGNOSIS — Z87891 Personal history of nicotine dependence: Secondary | ICD-10-CM | POA: Diagnosis not present

## 2014-08-11 DIAGNOSIS — I10 Essential (primary) hypertension: Secondary | ICD-10-CM | POA: Diagnosis not present

## 2014-08-11 DIAGNOSIS — E119 Type 2 diabetes mellitus without complications: Secondary | ICD-10-CM | POA: Diagnosis not present

## 2014-08-11 DIAGNOSIS — Z7982 Long term (current) use of aspirin: Secondary | ICD-10-CM | POA: Diagnosis not present

## 2014-08-11 DIAGNOSIS — R0600 Dyspnea, unspecified: Secondary | ICD-10-CM | POA: Diagnosis not present

## 2014-08-11 DIAGNOSIS — J441 Chronic obstructive pulmonary disease with (acute) exacerbation: Secondary | ICD-10-CM | POA: Diagnosis not present

## 2014-08-11 DIAGNOSIS — H9190 Unspecified hearing loss, unspecified ear: Secondary | ICD-10-CM | POA: Diagnosis present

## 2014-08-11 DIAGNOSIS — R079 Chest pain, unspecified: Secondary | ICD-10-CM | POA: Diagnosis not present

## 2014-08-11 DIAGNOSIS — R000 Tachycardia, unspecified: Secondary | ICD-10-CM | POA: Diagnosis present

## 2014-08-11 DIAGNOSIS — Z9981 Dependence on supplemental oxygen: Secondary | ICD-10-CM | POA: Diagnosis not present

## 2014-08-11 DIAGNOSIS — I509 Heart failure, unspecified: Secondary | ICD-10-CM | POA: Diagnosis not present

## 2014-08-11 DIAGNOSIS — I4892 Unspecified atrial flutter: Secondary | ICD-10-CM | POA: Diagnosis not present

## 2014-08-11 DIAGNOSIS — J9610 Chronic respiratory failure, unspecified whether with hypoxia or hypercapnia: Secondary | ICD-10-CM | POA: Diagnosis not present

## 2014-08-11 DIAGNOSIS — Z79899 Other long term (current) drug therapy: Secondary | ICD-10-CM | POA: Diagnosis not present

## 2014-08-11 DIAGNOSIS — R0602 Shortness of breath: Secondary | ICD-10-CM | POA: Diagnosis not present

## 2014-08-14 ENCOUNTER — Other Ambulatory Visit: Payer: Self-pay

## 2014-08-14 DIAGNOSIS — E099 Drug or chemical induced diabetes mellitus without complications: Secondary | ICD-10-CM

## 2014-08-14 DIAGNOSIS — I10 Essential (primary) hypertension: Secondary | ICD-10-CM

## 2014-08-14 MED ORDER — GLIPIZIDE ER 2.5 MG PO TB24: 2.5000 mg | ORAL_TABLET | Freq: Every day | ORAL | Status: DC

## 2014-08-15 DIAGNOSIS — Z8619 Personal history of other infectious and parasitic diseases: Secondary | ICD-10-CM | POA: Diagnosis not present

## 2014-08-15 DIAGNOSIS — E119 Type 2 diabetes mellitus without complications: Secondary | ICD-10-CM | POA: Diagnosis not present

## 2014-08-15 DIAGNOSIS — Z87891 Personal history of nicotine dependence: Secondary | ICD-10-CM | POA: Diagnosis not present

## 2014-08-15 DIAGNOSIS — I4891 Unspecified atrial fibrillation: Secondary | ICD-10-CM | POA: Diagnosis not present

## 2014-08-15 DIAGNOSIS — J441 Chronic obstructive pulmonary disease with (acute) exacerbation: Secondary | ICD-10-CM | POA: Diagnosis not present

## 2014-08-15 DIAGNOSIS — Z9981 Dependence on supplemental oxygen: Secondary | ICD-10-CM | POA: Diagnosis not present

## 2014-08-15 DIAGNOSIS — I2510 Atherosclerotic heart disease of native coronary artery without angina pectoris: Secondary | ICD-10-CM | POA: Diagnosis not present

## 2014-08-15 DIAGNOSIS — I252 Old myocardial infarction: Secondary | ICD-10-CM | POA: Diagnosis not present

## 2014-08-15 DIAGNOSIS — H9190 Unspecified hearing loss, unspecified ear: Secondary | ICD-10-CM | POA: Diagnosis not present

## 2014-08-15 DIAGNOSIS — I10 Essential (primary) hypertension: Secondary | ICD-10-CM | POA: Diagnosis not present

## 2014-08-15 DIAGNOSIS — I4892 Unspecified atrial flutter: Secondary | ICD-10-CM | POA: Diagnosis not present

## 2014-08-16 DIAGNOSIS — I10 Essential (primary) hypertension: Secondary | ICD-10-CM | POA: Diagnosis not present

## 2014-08-16 DIAGNOSIS — E119 Type 2 diabetes mellitus without complications: Secondary | ICD-10-CM | POA: Diagnosis not present

## 2014-08-16 DIAGNOSIS — I4891 Unspecified atrial fibrillation: Secondary | ICD-10-CM | POA: Diagnosis not present

## 2014-08-16 DIAGNOSIS — J441 Chronic obstructive pulmonary disease with (acute) exacerbation: Secondary | ICD-10-CM | POA: Diagnosis not present

## 2014-08-16 DIAGNOSIS — I4892 Unspecified atrial flutter: Secondary | ICD-10-CM | POA: Diagnosis not present

## 2014-08-16 DIAGNOSIS — I2510 Atherosclerotic heart disease of native coronary artery without angina pectoris: Secondary | ICD-10-CM | POA: Diagnosis not present

## 2014-08-19 DIAGNOSIS — I2510 Atherosclerotic heart disease of native coronary artery without angina pectoris: Secondary | ICD-10-CM | POA: Diagnosis not present

## 2014-08-19 DIAGNOSIS — E119 Type 2 diabetes mellitus without complications: Secondary | ICD-10-CM | POA: Diagnosis not present

## 2014-08-19 DIAGNOSIS — I4892 Unspecified atrial flutter: Secondary | ICD-10-CM | POA: Diagnosis not present

## 2014-08-19 DIAGNOSIS — J441 Chronic obstructive pulmonary disease with (acute) exacerbation: Secondary | ICD-10-CM | POA: Diagnosis not present

## 2014-08-19 DIAGNOSIS — I4891 Unspecified atrial fibrillation: Secondary | ICD-10-CM | POA: Diagnosis not present

## 2014-08-19 DIAGNOSIS — I10 Essential (primary) hypertension: Secondary | ICD-10-CM | POA: Diagnosis not present

## 2014-08-20 ENCOUNTER — Ambulatory Visit: Payer: Medicare Other | Admitting: Family Medicine

## 2014-08-22 DIAGNOSIS — I2510 Atherosclerotic heart disease of native coronary artery without angina pectoris: Secondary | ICD-10-CM | POA: Diagnosis not present

## 2014-08-22 DIAGNOSIS — J441 Chronic obstructive pulmonary disease with (acute) exacerbation: Secondary | ICD-10-CM | POA: Diagnosis not present

## 2014-08-22 DIAGNOSIS — I4892 Unspecified atrial flutter: Secondary | ICD-10-CM | POA: Diagnosis not present

## 2014-08-22 DIAGNOSIS — E119 Type 2 diabetes mellitus without complications: Secondary | ICD-10-CM | POA: Diagnosis not present

## 2014-08-22 DIAGNOSIS — I10 Essential (primary) hypertension: Secondary | ICD-10-CM | POA: Diagnosis not present

## 2014-08-22 DIAGNOSIS — I4891 Unspecified atrial fibrillation: Secondary | ICD-10-CM | POA: Diagnosis not present

## 2014-08-27 DIAGNOSIS — I10 Essential (primary) hypertension: Secondary | ICD-10-CM | POA: Diagnosis not present

## 2014-08-27 DIAGNOSIS — I2510 Atherosclerotic heart disease of native coronary artery without angina pectoris: Secondary | ICD-10-CM | POA: Diagnosis not present

## 2014-08-27 DIAGNOSIS — I4892 Unspecified atrial flutter: Secondary | ICD-10-CM | POA: Diagnosis not present

## 2014-08-27 DIAGNOSIS — I4891 Unspecified atrial fibrillation: Secondary | ICD-10-CM | POA: Diagnosis not present

## 2014-08-27 DIAGNOSIS — J441 Chronic obstructive pulmonary disease with (acute) exacerbation: Secondary | ICD-10-CM | POA: Diagnosis not present

## 2014-08-27 DIAGNOSIS — E119 Type 2 diabetes mellitus without complications: Secondary | ICD-10-CM | POA: Diagnosis not present

## 2014-08-28 ENCOUNTER — Telehealth: Payer: Self-pay | Admitting: Family Medicine

## 2014-08-28 MED ORDER — UMECLIDINIUM BROMIDE 62.5 MCG/INH IN AEPB: 1.0000 | INHALATION_SPRAY | Freq: Every day | RESPIRATORY_TRACT | Status: DC

## 2014-08-28 NOTE — Telephone Encounter (Signed)
Please call patient to let him know that as of January 1 his prescription coverage will no longer cover Spiriva so we will change him to Incruse.  New rx sent can pick up when runs out of the spiriva.

## 2014-08-28 NOTE — Telephone Encounter (Signed)
Patient's son in law advised.

## 2014-08-29 DIAGNOSIS — J441 Chronic obstructive pulmonary disease with (acute) exacerbation: Secondary | ICD-10-CM | POA: Diagnosis not present

## 2014-08-29 DIAGNOSIS — I2510 Atherosclerotic heart disease of native coronary artery without angina pectoris: Secondary | ICD-10-CM | POA: Diagnosis not present

## 2014-08-29 DIAGNOSIS — I10 Essential (primary) hypertension: Secondary | ICD-10-CM | POA: Diagnosis not present

## 2014-08-29 DIAGNOSIS — I4891 Unspecified atrial fibrillation: Secondary | ICD-10-CM | POA: Diagnosis not present

## 2014-08-29 DIAGNOSIS — E119 Type 2 diabetes mellitus without complications: Secondary | ICD-10-CM | POA: Diagnosis not present

## 2014-08-29 DIAGNOSIS — I4892 Unspecified atrial flutter: Secondary | ICD-10-CM | POA: Diagnosis not present

## 2014-09-01 DIAGNOSIS — Z79899 Other long term (current) drug therapy: Secondary | ICD-10-CM | POA: Diagnosis not present

## 2014-09-01 DIAGNOSIS — I4892 Unspecified atrial flutter: Secondary | ICD-10-CM | POA: Diagnosis not present

## 2014-09-01 DIAGNOSIS — J9622 Acute and chronic respiratory failure with hypercapnia: Secondary | ICD-10-CM | POA: Diagnosis not present

## 2014-09-01 DIAGNOSIS — R079 Chest pain, unspecified: Secondary | ICD-10-CM | POA: Diagnosis not present

## 2014-09-01 DIAGNOSIS — R5381 Other malaise: Secondary | ICD-10-CM | POA: Diagnosis present

## 2014-09-01 DIAGNOSIS — I10 Essential (primary) hypertension: Secondary | ICD-10-CM | POA: Diagnosis not present

## 2014-09-01 DIAGNOSIS — J8410 Pulmonary fibrosis, unspecified: Secondary | ICD-10-CM | POA: Diagnosis not present

## 2014-09-01 DIAGNOSIS — Z87891 Personal history of nicotine dependence: Secondary | ICD-10-CM | POA: Diagnosis not present

## 2014-09-01 DIAGNOSIS — Z7982 Long term (current) use of aspirin: Secondary | ICD-10-CM | POA: Diagnosis not present

## 2014-09-01 DIAGNOSIS — J439 Emphysema, unspecified: Secondary | ICD-10-CM | POA: Diagnosis not present

## 2014-09-01 DIAGNOSIS — E119 Type 2 diabetes mellitus without complications: Secondary | ICD-10-CM | POA: Diagnosis not present

## 2014-09-01 DIAGNOSIS — J9621 Acute and chronic respiratory failure with hypoxia: Secondary | ICD-10-CM | POA: Diagnosis not present

## 2014-09-01 DIAGNOSIS — R05 Cough: Secondary | ICD-10-CM | POA: Diagnosis not present

## 2014-09-01 DIAGNOSIS — R0602 Shortness of breath: Secondary | ICD-10-CM | POA: Diagnosis not present

## 2014-09-01 DIAGNOSIS — J441 Chronic obstructive pulmonary disease with (acute) exacerbation: Secondary | ICD-10-CM | POA: Diagnosis not present

## 2014-09-01 DIAGNOSIS — Z8249 Family history of ischemic heart disease and other diseases of the circulatory system: Secondary | ICD-10-CM | POA: Diagnosis not present

## 2014-09-01 DIAGNOSIS — J9692 Respiratory failure, unspecified with hypercapnia: Secondary | ICD-10-CM | POA: Diagnosis not present

## 2014-09-01 DIAGNOSIS — I2510 Atherosclerotic heart disease of native coronary artery without angina pectoris: Secondary | ICD-10-CM | POA: Diagnosis present

## 2014-09-01 DIAGNOSIS — H9190 Unspecified hearing loss, unspecified ear: Secondary | ICD-10-CM | POA: Diagnosis present

## 2014-09-01 DIAGNOSIS — I4891 Unspecified atrial fibrillation: Secondary | ICD-10-CM | POA: Diagnosis present

## 2014-09-01 DIAGNOSIS — Z9981 Dependence on supplemental oxygen: Secondary | ICD-10-CM | POA: Diagnosis not present

## 2014-09-01 DIAGNOSIS — I5020 Unspecified systolic (congestive) heart failure: Secondary | ICD-10-CM | POA: Diagnosis present

## 2014-09-03 ENCOUNTER — Telehealth: Payer: Self-pay

## 2014-09-03 DIAGNOSIS — E119 Type 2 diabetes mellitus without complications: Secondary | ICD-10-CM

## 2014-09-03 DIAGNOSIS — I4892 Unspecified atrial flutter: Secondary | ICD-10-CM

## 2014-09-03 DIAGNOSIS — I10 Essential (primary) hypertension: Secondary | ICD-10-CM

## 2014-09-03 DIAGNOSIS — J441 Chronic obstructive pulmonary disease with (acute) exacerbation: Secondary | ICD-10-CM

## 2014-09-03 NOTE — Telephone Encounter (Signed)
Yes, I would be happy to

## 2014-09-03 NOTE — Telephone Encounter (Signed)
Jared Lee has been place into hospice care. Louann Sjogren is the RN for hospice. She called to see if Dr Madilyn Fireman would be the attending provider. I advised yes as long as they do the supportive care.

## 2014-09-09 ENCOUNTER — Ambulatory Visit (INDEPENDENT_AMBULATORY_CARE_PROVIDER_SITE_OTHER): Payer: Medicare Other | Admitting: Family Medicine

## 2014-09-09 ENCOUNTER — Encounter: Payer: Self-pay | Admitting: Family Medicine

## 2014-09-09 VITALS — BP 145/80 | HR 81 | Ht 71.0 in | Wt 142.0 lb

## 2014-09-09 DIAGNOSIS — E099 Drug or chemical induced diabetes mellitus without complications: Secondary | ICD-10-CM | POA: Diagnosis not present

## 2014-09-09 DIAGNOSIS — I509 Heart failure, unspecified: Secondary | ICD-10-CM | POA: Diagnosis not present

## 2014-09-09 DIAGNOSIS — J441 Chronic obstructive pulmonary disease with (acute) exacerbation: Secondary | ICD-10-CM

## 2014-09-09 MED ORDER — ALBUTEROL SULFATE (2.5 MG/3ML) 0.083% IN NEBU
2.5000 mg | INHALATION_SOLUTION | Freq: Once | RESPIRATORY_TRACT | Status: AC
  Administered 2014-09-09: 2.5 mg via RESPIRATORY_TRACT

## 2014-09-09 NOTE — Progress Notes (Signed)
   Subjective:    Patient ID: Jared Lee, male    DOB: 1941/07/17, 74 y.o.   MRN: [id number]  HPI Patient was hospitalized on January 30,016 at [facility]. He was d/c home 3 days ago. He is declining about every 2 weeks. Son-in-law says he does well for about 2 week and then ends up going back to the hospital. Says hospice was ordered by the hospital team.  He really doesn't want hospice right now.  So wants home health to get involved again.  Has about 12 days of prednisone left.   Heart failure - no swelling. He is on lasix daily.  No recent exacerbations of his heart failure.  Diabetes-sugars 7 up and down especially while in the hospital. His last A1c looked great at 6.1 in December.   Review of Systems     Objective:   Physical Exam  Constitutional: He is oriented to person, place, and time. He appears well-developed and well-nourished.  HENT:  Head: Normocephalic and atraumatic.  Cardiovascular: Normal rate, regular rhythm and normal heart sounds.   Pulmonary/Chest: Effort normal and breath sounds normal.  Coarse BS bilat . No wheezing.   Neurological: He is alert and oriented to person, place, and time.  Skin: Skin is warm and dry.  Psychiatric: He has a normal mood and affect. His behavior is normal.          Assessment & Plan:  COPD exacerbation - much improved. He feels like he is back to his baseline. He still has felt more days of prednisone and antibiotic to complete. Unfortunately we were not able to get a copy of the records before he went home today. Though I will review those and then call if we need to do any additional workup or blood work. We will call home health tomorrow just to make sure that they're getting him set back up since he is withdrawing from hospice care.  Diabetes, secondary to prednisone-well controlled at current. Due to recheck levels in March.  Congestive heart failure-he's actually had no recent exacerbations which is fantastic.  Continue to monitor weights daily.

## 2014-09-11 ENCOUNTER — Encounter: Payer: Self-pay | Admitting: Family Medicine

## 2014-09-15 ENCOUNTER — Other Ambulatory Visit: Payer: Self-pay | Admitting: Family Medicine

## 2014-09-17 ENCOUNTER — Other Ambulatory Visit: Payer: Self-pay

## 2014-09-17 ENCOUNTER — Other Ambulatory Visit: Payer: Self-pay | Admitting: Family Medicine

## 2014-09-17 MED ORDER — ALBUTEROL SULFATE (2.5 MG/3ML) 0.083% IN NEBU: 2.5000 mg | INHALATION_SOLUTION | Freq: Four times a day (QID) | RESPIRATORY_TRACT | Status: DC | PRN

## 2014-09-25 DIAGNOSIS — R05 Cough: Secondary | ICD-10-CM | POA: Diagnosis not present

## 2014-09-25 DIAGNOSIS — I517 Cardiomegaly: Secondary | ICD-10-CM | POA: Diagnosis not present

## 2014-09-25 DIAGNOSIS — J80 Acute respiratory distress syndrome: Secondary | ICD-10-CM | POA: Diagnosis not present

## 2014-09-25 DIAGNOSIS — E119 Type 2 diabetes mellitus without complications: Secondary | ICD-10-CM | POA: Diagnosis present

## 2014-09-25 DIAGNOSIS — J441 Chronic obstructive pulmonary disease with (acute) exacerbation: Secondary | ICD-10-CM | POA: Diagnosis present

## 2014-09-25 DIAGNOSIS — I10 Essential (primary) hypertension: Secondary | ICD-10-CM | POA: Diagnosis not present

## 2014-09-25 DIAGNOSIS — R0989 Other specified symptoms and signs involving the circulatory and respiratory systems: Secondary | ICD-10-CM | POA: Diagnosis not present

## 2014-09-25 DIAGNOSIS — R0600 Dyspnea, unspecified: Secondary | ICD-10-CM | POA: Diagnosis not present

## 2014-09-25 DIAGNOSIS — J9692 Respiratory failure, unspecified with hypercapnia: Secondary | ICD-10-CM | POA: Diagnosis not present

## 2014-09-25 DIAGNOSIS — J9622 Acute and chronic respiratory failure with hypercapnia: Secondary | ICD-10-CM | POA: Diagnosis present

## 2014-09-25 DIAGNOSIS — I5032 Chronic diastolic (congestive) heart failure: Secondary | ICD-10-CM | POA: Diagnosis not present

## 2014-09-25 DIAGNOSIS — E118 Type 2 diabetes mellitus with unspecified complications: Secondary | ICD-10-CM | POA: Diagnosis not present

## 2014-09-25 DIAGNOSIS — J9620 Acute and chronic respiratory failure, unspecified whether with hypoxia or hypercapnia: Secondary | ICD-10-CM | POA: Diagnosis not present

## 2014-09-25 DIAGNOSIS — Z79899 Other long term (current) drug therapy: Secondary | ICD-10-CM | POA: Diagnosis not present

## 2014-09-30 ENCOUNTER — Other Ambulatory Visit: Payer: Self-pay | Admitting: Family Medicine

## 2014-10-01 NOTE — Telephone Encounter (Signed)
Needs appt to address BP before future refills

## 2014-10-09 ENCOUNTER — Telehealth: Payer: Self-pay | Admitting: Family Medicine

## 2014-10-09 NOTE — Telephone Encounter (Signed)
Please call pt: the spiriva he is on is not longer preferred on his insurance.  Incruse which is similar is covered. Make sure not taking both.  Once finishes spiriva can start teh incruse.

## 2014-10-11 ENCOUNTER — Ambulatory Visit (INDEPENDENT_AMBULATORY_CARE_PROVIDER_SITE_OTHER): Payer: Medicare Other | Admitting: Family Medicine

## 2014-10-11 ENCOUNTER — Encounter: Payer: Self-pay | Admitting: Family Medicine

## 2014-10-11 VITALS — BP 121/66 | HR 82 | Wt 142.0 lb

## 2014-10-11 DIAGNOSIS — J449 Chronic obstructive pulmonary disease, unspecified: Secondary | ICD-10-CM | POA: Diagnosis not present

## 2014-10-11 DIAGNOSIS — I10 Essential (primary) hypertension: Secondary | ICD-10-CM | POA: Diagnosis not present

## 2014-10-11 DIAGNOSIS — J441 Chronic obstructive pulmonary disease with (acute) exacerbation: Secondary | ICD-10-CM

## 2014-10-11 MED ORDER — AZITHROMYCIN 250 MG PO TABS
ORAL_TABLET | ORAL | Status: DC
Start: 2014-10-11 — End: 2014-10-28

## 2014-10-11 MED ORDER — ALBUTEROL SULFATE (2.5 MG/3ML) 0.083% IN NEBU
2.5000 mg | INHALATION_SOLUTION | Freq: Once | RESPIRATORY_TRACT | Status: AC
  Administered 2014-10-11: 2.5 mg via RESPIRATORY_TRACT

## 2014-10-11 NOTE — Telephone Encounter (Signed)
Spoke w/pt at [place] today regarding this.Jared Lee

## 2014-10-11 NOTE — Progress Notes (Signed)
   Subjective:    Patient ID: Jared Lee, male    DOB: [birth year], 74 y.o.   MRN: [id number]  HPI COPD - this is one-month follow-up/ED f/u. He was seen in emergency department about 2 weeks ago on January 31. He was prescribed azithromycin. He's been doing well since then. Note, his spiriva no longer covered.  He says he was told at the hospital to his Spiriva should be covered for the rest of his life. But we had received a letter from the insurance, anything that increase was covered. He is no longer getting home health services. He feels like it's a waste of time and not helpful. He feels his breathing is back to baseline today.   Hypertension- Pt denies chest pain, SOB, dizziness, or heart palpitations.  Taking meds as directed w/o problems.  Denies medication side effects.     Review of Systems     Objective:   Physical Exam  Constitutional: He is oriented to person, place, and time. He appears well-developed and well-nourished.  HENT:  Head: Normocephalic and atraumatic.  Cardiovascular: Normal rate, regular rhythm and normal heart sounds.   Pulmonary/Chest: Effort normal and breath sounds normal.  Neurological: He is alert and oriented to person, place, and time.  Skin: Skin is warm and dry.  Psychiatric: He has a normal mood and affect. His behavior is normal.          Assessment & Plan:  COPD, severe-no acute COPD exacerbation right now he is doing great. We discussed an option of going ahead and giving him an anti-biotic to use if he starts to notice an increase in cough and change in sputum production. He says typically he will have 3-4 days of symptoms before it becomes severe enough that he goes to the hospital. I would like to try this to see if this helps keep him out of the hospital. He is currently on a daily low-dose of prednisone.  Hypertension-well-controlled today. Looks fantastic.

## 2014-10-11 NOTE — Patient Instructions (Signed)
Ok to to just check blood sugars once a day. Do not need to take them 3 times a day. If you feel like you're starting to get sick with an increase in cough or change in the color of your sputum than outs okay to start the antibiotic called azithromycin.

## 2014-10-15 DIAGNOSIS — R0602 Shortness of breath: Secondary | ICD-10-CM | POA: Diagnosis not present

## 2014-10-15 DIAGNOSIS — J449 Chronic obstructive pulmonary disease, unspecified: Secondary | ICD-10-CM | POA: Diagnosis not present

## 2014-10-15 DIAGNOSIS — R0789 Other chest pain: Secondary | ICD-10-CM | POA: Diagnosis not present

## 2014-10-15 DIAGNOSIS — R05 Cough: Secondary | ICD-10-CM | POA: Diagnosis not present

## 2014-10-15 DIAGNOSIS — J441 Chronic obstructive pulmonary disease with (acute) exacerbation: Secondary | ICD-10-CM | POA: Diagnosis not present

## 2014-10-16 DIAGNOSIS — R197 Diarrhea, unspecified: Secondary | ICD-10-CM | POA: Diagnosis not present

## 2014-10-16 DIAGNOSIS — E785 Hyperlipidemia, unspecified: Secondary | ICD-10-CM | POA: Diagnosis not present

## 2014-10-16 DIAGNOSIS — R5381 Other malaise: Secondary | ICD-10-CM | POA: Diagnosis present

## 2014-10-16 DIAGNOSIS — J9610 Chronic respiratory failure, unspecified whether with hypoxia or hypercapnia: Secondary | ICD-10-CM | POA: Diagnosis not present

## 2014-10-16 DIAGNOSIS — A047 Enterocolitis due to Clostridium difficile: Secondary | ICD-10-CM | POA: Diagnosis not present

## 2014-10-16 DIAGNOSIS — Z8249 Family history of ischemic heart disease and other diseases of the circulatory system: Secondary | ICD-10-CM | POA: Diagnosis not present

## 2014-10-16 DIAGNOSIS — J449 Chronic obstructive pulmonary disease, unspecified: Secondary | ICD-10-CM | POA: Diagnosis not present

## 2014-10-16 DIAGNOSIS — J441 Chronic obstructive pulmonary disease with (acute) exacerbation: Secondary | ICD-10-CM | POA: Diagnosis not present

## 2014-10-16 DIAGNOSIS — Z7982 Long term (current) use of aspirin: Secondary | ICD-10-CM | POA: Diagnosis not present

## 2014-10-16 DIAGNOSIS — Z87891 Personal history of nicotine dependence: Secondary | ICD-10-CM | POA: Diagnosis not present

## 2014-10-16 DIAGNOSIS — R0602 Shortness of breath: Secondary | ICD-10-CM | POA: Diagnosis not present

## 2014-10-16 DIAGNOSIS — Z79899 Other long term (current) drug therapy: Secondary | ICD-10-CM | POA: Diagnosis not present

## 2014-10-16 DIAGNOSIS — R54 Age-related physical debility: Secondary | ICD-10-CM | POA: Diagnosis not present

## 2014-10-16 DIAGNOSIS — E119 Type 2 diabetes mellitus without complications: Secondary | ICD-10-CM | POA: Diagnosis present

## 2014-10-16 DIAGNOSIS — H9190 Unspecified hearing loss, unspecified ear: Secondary | ICD-10-CM | POA: Diagnosis present

## 2014-10-16 DIAGNOSIS — I2510 Atherosclerotic heart disease of native coronary artery without angina pectoris: Secondary | ICD-10-CM | POA: Diagnosis not present

## 2014-10-16 DIAGNOSIS — R0789 Other chest pain: Secondary | ICD-10-CM | POA: Diagnosis not present

## 2014-10-16 DIAGNOSIS — I480 Paroxysmal atrial fibrillation: Secondary | ICD-10-CM | POA: Diagnosis present

## 2014-10-16 DIAGNOSIS — I10 Essential (primary) hypertension: Secondary | ICD-10-CM | POA: Diagnosis not present

## 2014-10-16 DIAGNOSIS — R05 Cough: Secondary | ICD-10-CM | POA: Diagnosis not present

## 2014-10-16 DIAGNOSIS — Z9981 Dependence on supplemental oxygen: Secondary | ICD-10-CM | POA: Diagnosis not present

## 2014-10-21 DIAGNOSIS — Z87891 Personal history of nicotine dependence: Secondary | ICD-10-CM | POA: Diagnosis not present

## 2014-10-21 DIAGNOSIS — E119 Type 2 diabetes mellitus without complications: Secondary | ICD-10-CM | POA: Diagnosis not present

## 2014-10-21 DIAGNOSIS — I4891 Unspecified atrial fibrillation: Secondary | ICD-10-CM | POA: Diagnosis not present

## 2014-10-21 DIAGNOSIS — A047 Enterocolitis due to Clostridium difficile: Secondary | ICD-10-CM | POA: Diagnosis not present

## 2014-10-21 DIAGNOSIS — I2510 Atherosclerotic heart disease of native coronary artery without angina pectoris: Secondary | ICD-10-CM | POA: Diagnosis not present

## 2014-10-21 DIAGNOSIS — J441 Chronic obstructive pulmonary disease with (acute) exacerbation: Secondary | ICD-10-CM | POA: Diagnosis not present

## 2014-10-21 DIAGNOSIS — I10 Essential (primary) hypertension: Secondary | ICD-10-CM | POA: Diagnosis not present

## 2014-10-23 DIAGNOSIS — I10 Essential (primary) hypertension: Secondary | ICD-10-CM | POA: Diagnosis not present

## 2014-10-23 DIAGNOSIS — I2510 Atherosclerotic heart disease of native coronary artery without angina pectoris: Secondary | ICD-10-CM | POA: Diagnosis not present

## 2014-10-23 DIAGNOSIS — I4891 Unspecified atrial fibrillation: Secondary | ICD-10-CM | POA: Diagnosis not present

## 2014-10-23 DIAGNOSIS — A047 Enterocolitis due to Clostridium difficile: Secondary | ICD-10-CM | POA: Diagnosis not present

## 2014-10-23 DIAGNOSIS — J441 Chronic obstructive pulmonary disease with (acute) exacerbation: Secondary | ICD-10-CM | POA: Diagnosis not present

## 2014-10-23 DIAGNOSIS — E119 Type 2 diabetes mellitus without complications: Secondary | ICD-10-CM | POA: Diagnosis not present

## 2014-10-25 ENCOUNTER — Telehealth: Payer: Self-pay | Admitting: Family Medicine

## 2014-10-25 DIAGNOSIS — E119 Type 2 diabetes mellitus without complications: Secondary | ICD-10-CM | POA: Diagnosis not present

## 2014-10-25 DIAGNOSIS — I10 Essential (primary) hypertension: Secondary | ICD-10-CM | POA: Diagnosis not present

## 2014-10-25 DIAGNOSIS — I2510 Atherosclerotic heart disease of native coronary artery without angina pectoris: Secondary | ICD-10-CM | POA: Diagnosis not present

## 2014-10-25 DIAGNOSIS — I4891 Unspecified atrial fibrillation: Secondary | ICD-10-CM | POA: Diagnosis not present

## 2014-10-25 DIAGNOSIS — J441 Chronic obstructive pulmonary disease with (acute) exacerbation: Secondary | ICD-10-CM | POA: Diagnosis not present

## 2014-10-25 DIAGNOSIS — A047 Enterocolitis due to Clostridium difficile: Secondary | ICD-10-CM | POA: Diagnosis not present

## 2014-10-25 NOTE — Telephone Encounter (Signed)
Lori from [facility] called in regard to Draken Farrior states she needs to speak with a nurse today about Karmello Abercrombie, they need some kind of medicine that will keep him from going into the hospital (Mentioned something about a bi-pap and Iv Prednisone. Carmell Austria to know if a nurse can call her on [phone]. Thanks

## 2014-10-28 ENCOUNTER — Ambulatory Visit (INDEPENDENT_AMBULATORY_CARE_PROVIDER_SITE_OTHER): Payer: Medicare Other | Admitting: Family Medicine

## 2014-10-28 ENCOUNTER — Encounter: Payer: Self-pay | Admitting: Family Medicine

## 2014-10-28 VITALS — BP 115/71 | HR 88 | Wt 144.0 lb

## 2014-10-28 DIAGNOSIS — J441 Chronic obstructive pulmonary disease with (acute) exacerbation: Secondary | ICD-10-CM

## 2014-10-28 DIAGNOSIS — A0472 Enterocolitis due to Clostridium difficile, not specified as recurrent: Secondary | ICD-10-CM

## 2014-10-28 DIAGNOSIS — A047 Enterocolitis due to Clostridium difficile: Secondary | ICD-10-CM | POA: Diagnosis not present

## 2014-10-28 DIAGNOSIS — I10 Essential (primary) hypertension: Secondary | ICD-10-CM

## 2014-10-28 MED ORDER — METHYLPREDNISOLONE SODIUM SUCC 125 MG IJ SOLR
125.0000 mg | Freq: Once | INTRAMUSCULAR | Status: AC
  Administered 2014-10-28: 125 mg via INTRAMUSCULAR

## 2014-10-28 MED ORDER — ALBUTEROL SULFATE (2.5 MG/3ML) 0.083% IN NEBU
2.5000 mg | INHALATION_SOLUTION | Freq: Once | RESPIRATORY_TRACT | Status: AC
  Administered 2014-10-28: 2.5 mg via RESPIRATORY_TRACT

## 2014-10-28 NOTE — Progress Notes (Signed)
   Subjective:    Patient ID: Jared Lee, male    DOB: 1941-05-24, 74 y.o.   MRN: [id number]  HPI 74 year old male with a history of COPD and congestive heart failure who comes in today for hospital follow-up for COPD exacerbation. He was seen at [facility] on February 17 and discharged home on fibroid 20th. He was discharged home on azithromycin. He says he took his last time last night as well as prednisone. He is now down to 10 mg daily and says he has enough for about 15 days. He has been feeling a little bit more short of breath the last 2 days and says he almost went to the emergency room yesterday but felt a little bit better today. Though still quite short of breath. He is asking for your all treatment here in the office today. He is Re: On Spiriva and Symbicort. He does have home health coming out but really doesn't want them. He says they're really not helpful and only do is take his blood pressure.  Hypertension- Pt denies chest pain, SOB, dizziness, or heart palpitations.  Taking meds as directed w/o problems.  Denies medication side effects.    Heart failure-no recent exacerbations.   Review of Systems     Objective:   Physical Exam  Constitutional: He is oriented to person, place, and time. He appears well-developed and well-nourished.  HENT:  Head: Normocephalic and atraumatic.  Cardiovascular: Normal rate, regular rhythm and normal heart sounds.   Pulmonary/Chest: Effort normal.  Poor air movement. No crackles or rhonchi.  Neurological: He is alert and oriented to person, place, and time.  Skin: Skin is warm and dry.  Psychiatric: He has a normal mood and affect. His behavior is normal.          Assessment & Plan:  COPD exacerbation-given IM Solu-Medrol and nebulizer treatment here in the office. His breathing was much better afterwards. His son-in-law is here with him for the office visit today. Discussed again that they are welcome to call and get  a same-day appointment if they feel like his COPD is getting worse. Where happy to see him and treat him aggressively here in the office. If we can catch this early enough that may help keep him out of the hospital. He may also benefit from being on chronic low-dose prednisone. Next  C. difficile colitis-will check records from [facility] to see what he is on.  He was placed on metronidazole.   Hypertension-well-controlled on current regimen.

## 2014-10-29 DIAGNOSIS — J441 Chronic obstructive pulmonary disease with (acute) exacerbation: Secondary | ICD-10-CM | POA: Diagnosis not present

## 2014-10-29 DIAGNOSIS — E119 Type 2 diabetes mellitus without complications: Secondary | ICD-10-CM | POA: Diagnosis not present

## 2014-10-29 DIAGNOSIS — I4891 Unspecified atrial fibrillation: Secondary | ICD-10-CM | POA: Diagnosis not present

## 2014-10-29 DIAGNOSIS — A047 Enterocolitis due to Clostridium difficile: Secondary | ICD-10-CM | POA: Diagnosis not present

## 2014-10-29 DIAGNOSIS — I10 Essential (primary) hypertension: Secondary | ICD-10-CM | POA: Diagnosis not present

## 2014-10-29 DIAGNOSIS — J449 Chronic obstructive pulmonary disease, unspecified: Secondary | ICD-10-CM | POA: Diagnosis not present

## 2014-10-29 DIAGNOSIS — I2510 Atherosclerotic heart disease of native coronary artery without angina pectoris: Secondary | ICD-10-CM | POA: Diagnosis not present

## 2014-10-29 LAB — HEMOGLOBIN A1C: Hgb A1c MFr Bld: 6.2 % — AB (ref 4.0–6.0)

## 2014-10-29 NOTE — Telephone Encounter (Signed)
Please call to get details. We can't write for IV prednisone.

## 2014-10-30 ENCOUNTER — Encounter: Payer: Self-pay | Admitting: Family Medicine

## 2014-10-31 DIAGNOSIS — E119 Type 2 diabetes mellitus without complications: Secondary | ICD-10-CM | POA: Diagnosis not present

## 2014-10-31 DIAGNOSIS — I2510 Atherosclerotic heart disease of native coronary artery without angina pectoris: Secondary | ICD-10-CM | POA: Diagnosis not present

## 2014-10-31 DIAGNOSIS — J441 Chronic obstructive pulmonary disease with (acute) exacerbation: Secondary | ICD-10-CM | POA: Diagnosis not present

## 2014-10-31 DIAGNOSIS — A047 Enterocolitis due to Clostridium difficile: Secondary | ICD-10-CM | POA: Diagnosis not present

## 2014-10-31 DIAGNOSIS — I10 Essential (primary) hypertension: Secondary | ICD-10-CM | POA: Diagnosis not present

## 2014-10-31 DIAGNOSIS — I4891 Unspecified atrial fibrillation: Secondary | ICD-10-CM | POA: Diagnosis not present

## 2014-10-31 NOTE — Telephone Encounter (Signed)
Jared Lee states he is a lot better since the injection.

## 2014-11-03 DIAGNOSIS — R5381 Other malaise: Secondary | ICD-10-CM | POA: Diagnosis not present

## 2014-11-03 DIAGNOSIS — I2510 Atherosclerotic heart disease of native coronary artery without angina pectoris: Secondary | ICD-10-CM | POA: Diagnosis not present

## 2014-11-03 DIAGNOSIS — J441 Chronic obstructive pulmonary disease with (acute) exacerbation: Secondary | ICD-10-CM | POA: Diagnosis not present

## 2014-11-03 DIAGNOSIS — I10 Essential (primary) hypertension: Secondary | ICD-10-CM | POA: Diagnosis not present

## 2014-11-03 DIAGNOSIS — E785 Hyperlipidemia, unspecified: Secondary | ICD-10-CM | POA: Diagnosis present

## 2014-11-03 DIAGNOSIS — J9620 Acute and chronic respiratory failure, unspecified whether with hypoxia or hypercapnia: Secondary | ICD-10-CM | POA: Diagnosis not present

## 2014-11-03 DIAGNOSIS — I5032 Chronic diastolic (congestive) heart failure: Secondary | ICD-10-CM | POA: Diagnosis not present

## 2014-11-03 DIAGNOSIS — I480 Paroxysmal atrial fibrillation: Secondary | ICD-10-CM | POA: Diagnosis present

## 2014-11-03 DIAGNOSIS — E119 Type 2 diabetes mellitus without complications: Secondary | ICD-10-CM | POA: Diagnosis not present

## 2014-11-03 DIAGNOSIS — R0602 Shortness of breath: Secondary | ICD-10-CM | POA: Diagnosis not present

## 2014-11-03 DIAGNOSIS — Z79899 Other long term (current) drug therapy: Secondary | ICD-10-CM | POA: Diagnosis not present

## 2014-11-03 DIAGNOSIS — R54 Age-related physical debility: Secondary | ICD-10-CM | POA: Diagnosis not present

## 2014-11-03 DIAGNOSIS — Z8249 Family history of ischemic heart disease and other diseases of the circulatory system: Secondary | ICD-10-CM | POA: Diagnosis not present

## 2014-11-03 DIAGNOSIS — H9190 Unspecified hearing loss, unspecified ear: Secondary | ICD-10-CM | POA: Diagnosis present

## 2014-11-03 DIAGNOSIS — J9611 Chronic respiratory failure with hypoxia: Secondary | ICD-10-CM | POA: Diagnosis not present

## 2014-11-03 DIAGNOSIS — A047 Enterocolitis due to Clostridium difficile: Secondary | ICD-10-CM | POA: Diagnosis not present

## 2014-11-03 DIAGNOSIS — R0902 Hypoxemia: Secondary | ICD-10-CM | POA: Diagnosis not present

## 2014-11-03 DIAGNOSIS — Z9981 Dependence on supplemental oxygen: Secondary | ICD-10-CM | POA: Diagnosis not present

## 2014-11-03 DIAGNOSIS — J439 Emphysema, unspecified: Secondary | ICD-10-CM | POA: Diagnosis not present

## 2014-11-03 DIAGNOSIS — J9621 Acute and chronic respiratory failure with hypoxia: Secondary | ICD-10-CM | POA: Diagnosis not present

## 2014-11-03 DIAGNOSIS — Z515 Encounter for palliative care: Secondary | ICD-10-CM | POA: Diagnosis not present

## 2014-11-03 DIAGNOSIS — J90 Pleural effusion, not elsewhere classified: Secondary | ICD-10-CM | POA: Diagnosis not present

## 2014-11-03 DIAGNOSIS — R05 Cough: Secondary | ICD-10-CM | POA: Diagnosis not present

## 2014-11-05 DIAGNOSIS — I2510 Atherosclerotic heart disease of native coronary artery without angina pectoris: Secondary | ICD-10-CM | POA: Diagnosis not present

## 2014-11-05 DIAGNOSIS — J441 Chronic obstructive pulmonary disease with (acute) exacerbation: Secondary | ICD-10-CM | POA: Diagnosis not present

## 2014-11-05 DIAGNOSIS — E119 Type 2 diabetes mellitus without complications: Secondary | ICD-10-CM | POA: Diagnosis not present

## 2014-11-05 DIAGNOSIS — A047 Enterocolitis due to Clostridium difficile: Secondary | ICD-10-CM | POA: Diagnosis not present

## 2014-11-07 ENCOUNTER — Encounter: Payer: Self-pay | Admitting: Family Medicine

## 2014-11-08 DIAGNOSIS — M549 Dorsalgia, unspecified: Secondary | ICD-10-CM | POA: Diagnosis not present

## 2014-11-08 DIAGNOSIS — I10 Essential (primary) hypertension: Secondary | ICD-10-CM | POA: Diagnosis not present

## 2014-11-08 DIAGNOSIS — Z7982 Long term (current) use of aspirin: Secondary | ICD-10-CM | POA: Diagnosis not present

## 2014-11-08 DIAGNOSIS — S199XXA Unspecified injury of neck, initial encounter: Secondary | ICD-10-CM | POA: Diagnosis not present

## 2014-11-08 DIAGNOSIS — I729 Aneurysm of unspecified site: Secondary | ICD-10-CM | POA: Diagnosis not present

## 2014-11-08 DIAGNOSIS — Z79899 Other long term (current) drug therapy: Secondary | ICD-10-CM | POA: Diagnosis not present

## 2014-11-08 DIAGNOSIS — H269 Unspecified cataract: Secondary | ICD-10-CM | POA: Diagnosis not present

## 2014-11-08 DIAGNOSIS — I4891 Unspecified atrial fibrillation: Secondary | ICD-10-CM | POA: Diagnosis not present

## 2014-11-08 DIAGNOSIS — K219 Gastro-esophageal reflux disease without esophagitis: Secondary | ICD-10-CM | POA: Diagnosis not present

## 2014-11-08 DIAGNOSIS — I671 Cerebral aneurysm, nonruptured: Secondary | ICD-10-CM | POA: Diagnosis not present

## 2014-11-08 DIAGNOSIS — E119 Type 2 diabetes mellitus without complications: Secondary | ICD-10-CM | POA: Diagnosis not present

## 2014-11-08 DIAGNOSIS — E785 Hyperlipidemia, unspecified: Secondary | ICD-10-CM | POA: Diagnosis not present

## 2014-11-08 DIAGNOSIS — R42 Dizziness and giddiness: Secondary | ICD-10-CM | POA: Diagnosis not present

## 2014-11-08 DIAGNOSIS — E860 Dehydration: Secondary | ICD-10-CM | POA: Diagnosis not present

## 2014-11-08 DIAGNOSIS — I959 Hypotension, unspecified: Secondary | ICD-10-CM | POA: Diagnosis not present

## 2014-11-08 DIAGNOSIS — H9190 Unspecified hearing loss, unspecified ear: Secondary | ICD-10-CM | POA: Diagnosis not present

## 2014-11-08 DIAGNOSIS — Z8619 Personal history of other infectious and parasitic diseases: Secondary | ICD-10-CM | POA: Diagnosis not present

## 2014-11-08 DIAGNOSIS — A047 Enterocolitis due to Clostridium difficile: Secondary | ICD-10-CM | POA: Diagnosis not present

## 2014-11-08 DIAGNOSIS — I509 Heart failure, unspecified: Secondary | ICD-10-CM | POA: Diagnosis not present

## 2014-11-08 DIAGNOSIS — S3992XA Unspecified injury of lower back, initial encounter: Secondary | ICD-10-CM | POA: Diagnosis not present

## 2014-11-08 DIAGNOSIS — Z9981 Dependence on supplemental oxygen: Secondary | ICD-10-CM | POA: Diagnosis not present

## 2014-11-08 DIAGNOSIS — R51 Headache: Secondary | ICD-10-CM | POA: Diagnosis not present

## 2014-11-08 DIAGNOSIS — S0990XA Unspecified injury of head, initial encounter: Secondary | ICD-10-CM | POA: Diagnosis not present

## 2014-11-08 DIAGNOSIS — J449 Chronic obstructive pulmonary disease, unspecified: Secondary | ICD-10-CM | POA: Diagnosis not present

## 2014-11-09 DIAGNOSIS — I071 Rheumatic tricuspid insufficiency: Secondary | ICD-10-CM | POA: Diagnosis not present

## 2014-11-09 DIAGNOSIS — Z881 Allergy status to other antibiotic agents status: Secondary | ICD-10-CM | POA: Diagnosis not present

## 2014-11-09 DIAGNOSIS — Z79899 Other long term (current) drug therapy: Secondary | ICD-10-CM | POA: Diagnosis not present

## 2014-11-09 DIAGNOSIS — Y92009 Unspecified place in unspecified non-institutional (private) residence as the place of occurrence of the external cause: Secondary | ICD-10-CM

## 2014-11-09 DIAGNOSIS — J9611 Chronic respiratory failure with hypoxia: Secondary | ICD-10-CM | POA: Insufficient documentation

## 2014-11-09 DIAGNOSIS — J449 Chronic obstructive pulmonary disease, unspecified: Secondary | ICD-10-CM | POA: Diagnosis not present

## 2014-11-09 DIAGNOSIS — R0902 Hypoxemia: Secondary | ICD-10-CM | POA: Diagnosis not present

## 2014-11-09 DIAGNOSIS — I480 Paroxysmal atrial fibrillation: Secondary | ICD-10-CM | POA: Diagnosis not present

## 2014-11-09 DIAGNOSIS — I671 Cerebral aneurysm, nonruptured: Secondary | ICD-10-CM | POA: Diagnosis not present

## 2014-11-09 DIAGNOSIS — Z888 Allergy status to other drugs, medicaments and biological substances status: Secondary | ICD-10-CM | POA: Diagnosis not present

## 2014-11-09 DIAGNOSIS — Z8619 Personal history of other infectious and parasitic diseases: Secondary | ICD-10-CM | POA: Insufficient documentation

## 2014-11-09 DIAGNOSIS — I272 Other secondary pulmonary hypertension: Secondary | ICD-10-CM | POA: Diagnosis not present

## 2014-11-09 DIAGNOSIS — R55 Syncope and collapse: Secondary | ICD-10-CM | POA: Diagnosis not present

## 2014-11-09 DIAGNOSIS — W19XXXD Unspecified fall, subsequent encounter: Secondary | ICD-10-CM | POA: Insufficient documentation

## 2014-11-09 DIAGNOSIS — E785 Hyperlipidemia, unspecified: Secondary | ICD-10-CM | POA: Diagnosis not present

## 2014-11-09 DIAGNOSIS — I10 Essential (primary) hypertension: Secondary | ICD-10-CM | POA: Diagnosis not present

## 2014-11-09 DIAGNOSIS — A0472 Enterocolitis due to Clostridium difficile, not specified as recurrent: Secondary | ICD-10-CM

## 2014-11-09 DIAGNOSIS — Z9981 Dependence on supplemental oxygen: Secondary | ICD-10-CM | POA: Diagnosis not present

## 2014-11-09 DIAGNOSIS — Z87891 Personal history of nicotine dependence: Secondary | ICD-10-CM | POA: Diagnosis not present

## 2014-11-09 DIAGNOSIS — A047 Enterocolitis due to Clostridium difficile: Secondary | ICD-10-CM | POA: Diagnosis not present

## 2014-11-09 DIAGNOSIS — I2510 Atherosclerotic heart disease of native coronary artery without angina pectoris: Secondary | ICD-10-CM | POA: Insufficient documentation

## 2014-11-09 DIAGNOSIS — E782 Mixed hyperlipidemia: Secondary | ICD-10-CM | POA: Diagnosis not present

## 2014-11-09 DIAGNOSIS — I951 Orthostatic hypotension: Secondary | ICD-10-CM | POA: Diagnosis not present

## 2014-11-10 DIAGNOSIS — R55 Syncope and collapse: Secondary | ICD-10-CM | POA: Diagnosis not present

## 2014-11-10 DIAGNOSIS — I671 Cerebral aneurysm, nonruptured: Secondary | ICD-10-CM | POA: Diagnosis not present

## 2014-11-10 DIAGNOSIS — I951 Orthostatic hypotension: Secondary | ICD-10-CM | POA: Diagnosis not present

## 2014-11-10 DIAGNOSIS — A047 Enterocolitis due to Clostridium difficile: Secondary | ICD-10-CM | POA: Diagnosis not present

## 2014-11-10 DIAGNOSIS — J9611 Chronic respiratory failure with hypoxia: Secondary | ICD-10-CM | POA: Diagnosis not present

## 2014-11-10 DIAGNOSIS — J449 Chronic obstructive pulmonary disease, unspecified: Secondary | ICD-10-CM | POA: Diagnosis not present

## 2014-11-11 DIAGNOSIS — R55 Syncope and collapse: Secondary | ICD-10-CM | POA: Diagnosis not present

## 2014-11-11 DIAGNOSIS — I671 Cerebral aneurysm, nonruptured: Secondary | ICD-10-CM | POA: Diagnosis not present

## 2014-11-11 DIAGNOSIS — I951 Orthostatic hypotension: Secondary | ICD-10-CM | POA: Insufficient documentation

## 2014-11-11 DIAGNOSIS — J449 Chronic obstructive pulmonary disease, unspecified: Secondary | ICD-10-CM | POA: Diagnosis not present

## 2014-11-11 DIAGNOSIS — J9611 Chronic respiratory failure with hypoxia: Secondary | ICD-10-CM | POA: Diagnosis not present

## 2014-11-11 DIAGNOSIS — A047 Enterocolitis due to Clostridium difficile: Secondary | ICD-10-CM | POA: Diagnosis not present

## 2014-11-12 DIAGNOSIS — I951 Orthostatic hypotension: Secondary | ICD-10-CM | POA: Diagnosis not present

## 2014-11-12 DIAGNOSIS — A047 Enterocolitis due to Clostridium difficile: Secondary | ICD-10-CM | POA: Diagnosis not present

## 2014-11-12 DIAGNOSIS — J449 Chronic obstructive pulmonary disease, unspecified: Secondary | ICD-10-CM | POA: Diagnosis not present

## 2014-11-12 DIAGNOSIS — I671 Cerebral aneurysm, nonruptured: Secondary | ICD-10-CM | POA: Diagnosis not present

## 2014-11-12 DIAGNOSIS — J9611 Chronic respiratory failure with hypoxia: Secondary | ICD-10-CM | POA: Diagnosis not present

## 2014-11-12 DIAGNOSIS — R55 Syncope and collapse: Secondary | ICD-10-CM | POA: Diagnosis not present

## 2014-11-13 DIAGNOSIS — I671 Cerebral aneurysm, nonruptured: Secondary | ICD-10-CM | POA: Diagnosis not present

## 2014-11-13 DIAGNOSIS — A047 Enterocolitis due to Clostridium difficile: Secondary | ICD-10-CM | POA: Diagnosis not present

## 2014-11-13 DIAGNOSIS — I951 Orthostatic hypotension: Secondary | ICD-10-CM | POA: Diagnosis not present

## 2014-11-13 DIAGNOSIS — J449 Chronic obstructive pulmonary disease, unspecified: Secondary | ICD-10-CM | POA: Diagnosis not present

## 2014-11-13 DIAGNOSIS — R55 Syncope and collapse: Secondary | ICD-10-CM | POA: Diagnosis not present

## 2014-11-13 DIAGNOSIS — J9611 Chronic respiratory failure with hypoxia: Secondary | ICD-10-CM | POA: Diagnosis not present

## 2014-11-14 ENCOUNTER — Ambulatory Visit: Payer: Medicare Other | Admitting: Family Medicine

## 2014-11-14 DIAGNOSIS — R55 Syncope and collapse: Secondary | ICD-10-CM | POA: Diagnosis not present

## 2014-11-14 DIAGNOSIS — I951 Orthostatic hypotension: Secondary | ICD-10-CM | POA: Diagnosis not present

## 2014-11-14 DIAGNOSIS — I2510 Atherosclerotic heart disease of native coronary artery without angina pectoris: Secondary | ICD-10-CM | POA: Diagnosis not present

## 2014-11-14 DIAGNOSIS — E782 Mixed hyperlipidemia: Secondary | ICD-10-CM | POA: Diagnosis not present

## 2014-11-14 DIAGNOSIS — J449 Chronic obstructive pulmonary disease, unspecified: Secondary | ICD-10-CM | POA: Diagnosis not present

## 2014-11-14 DIAGNOSIS — I480 Paroxysmal atrial fibrillation: Secondary | ICD-10-CM | POA: Diagnosis not present

## 2014-11-14 DIAGNOSIS — I671 Cerebral aneurysm, nonruptured: Secondary | ICD-10-CM | POA: Diagnosis not present

## 2014-11-14 DIAGNOSIS — I10 Essential (primary) hypertension: Secondary | ICD-10-CM | POA: Diagnosis not present

## 2014-11-14 DIAGNOSIS — W1839XD Other fall on same level, subsequent encounter: Secondary | ICD-10-CM | POA: Diagnosis not present

## 2014-11-14 DIAGNOSIS — Z9981 Dependence on supplemental oxygen: Secondary | ICD-10-CM | POA: Diagnosis not present

## 2014-11-14 DIAGNOSIS — J9611 Chronic respiratory failure with hypoxia: Secondary | ICD-10-CM | POA: Diagnosis not present

## 2014-11-14 DIAGNOSIS — Z8619 Personal history of other infectious and parasitic diseases: Secondary | ICD-10-CM | POA: Diagnosis not present

## 2014-11-19 ENCOUNTER — Telehealth: Payer: Self-pay | Admitting: *Deleted

## 2014-11-19 ENCOUNTER — Other Ambulatory Visit: Payer: Self-pay | Admitting: *Deleted

## 2014-11-19 DIAGNOSIS — I951 Orthostatic hypotension: Secondary | ICD-10-CM | POA: Diagnosis not present

## 2014-11-19 DIAGNOSIS — I671 Cerebral aneurysm, nonruptured: Secondary | ICD-10-CM | POA: Diagnosis not present

## 2014-11-19 DIAGNOSIS — J9611 Chronic respiratory failure with hypoxia: Secondary | ICD-10-CM | POA: Diagnosis not present

## 2014-11-19 DIAGNOSIS — J449 Chronic obstructive pulmonary disease, unspecified: Secondary | ICD-10-CM | POA: Diagnosis not present

## 2014-11-19 DIAGNOSIS — R55 Syncope and collapse: Secondary | ICD-10-CM | POA: Diagnosis not present

## 2014-11-19 DIAGNOSIS — I10 Essential (primary) hypertension: Secondary | ICD-10-CM | POA: Diagnosis not present

## 2014-11-19 MED ORDER — METOPROLOL TARTRATE 25 MG PO TABS: 100.0000 mg | ORAL_TABLET | Freq: Two times a day (BID) | ORAL | Status: DC

## 2014-11-19 MED ORDER — METOPROLOL TARTRATE 25 MG PO TABS: 25.0000 mg | ORAL_TABLET | Freq: Two times a day (BID) | ORAL | Status: DC

## 2014-11-19 NOTE — Telephone Encounter (Signed)
He still needs to be on the beta blocker because of his heart failure history. But I will decrease the dose from 100 mg down to 25 mg and will send a new prescription to his pharmacy. Please make sure that he is hydrating. Ok to decrease dose of Lasix to every other day.  If starts to retain fluid or swell then we can go back up o the lasix.

## 2014-11-19 NOTE — Telephone Encounter (Signed)
[name] from [facility] called stating that Mr. Jared Lee's BP this morning was 100/50 sitting and 90/50 standing.  She said that he is out of his metoprolol and wanted to know if you wanted him to continue this.  Please advise.

## 2014-11-25 ENCOUNTER — Ambulatory Visit: Payer: Medicare Other | Admitting: Family Medicine

## 2014-11-28 ENCOUNTER — Telehealth: Payer: Self-pay | Admitting: *Deleted

## 2014-11-28 ENCOUNTER — Other Ambulatory Visit: Payer: Self-pay | Admitting: Family Medicine

## 2014-11-28 ENCOUNTER — Ambulatory Visit: Payer: Medicare Other | Admitting: Family Medicine

## 2014-11-28 DIAGNOSIS — I951 Orthostatic hypotension: Secondary | ICD-10-CM | POA: Diagnosis not present

## 2014-11-28 DIAGNOSIS — I671 Cerebral aneurysm, nonruptured: Secondary | ICD-10-CM | POA: Diagnosis not present

## 2014-11-28 DIAGNOSIS — J449 Chronic obstructive pulmonary disease, unspecified: Secondary | ICD-10-CM | POA: Diagnosis not present

## 2014-11-28 DIAGNOSIS — R55 Syncope and collapse: Secondary | ICD-10-CM | POA: Diagnosis not present

## 2014-11-28 DIAGNOSIS — J9611 Chronic respiratory failure with hypoxia: Secondary | ICD-10-CM | POA: Diagnosis not present

## 2014-11-28 DIAGNOSIS — I10 Essential (primary) hypertension: Secondary | ICD-10-CM | POA: Diagnosis not present

## 2014-11-28 NOTE — Telephone Encounter (Signed)
Nurse tech from [facility] called and states pt canceled his appt for with [facility] for today. He c/o for greenish yellowish mucus when he coughs. The nurse states he has an abx that was written by dr. Madilyn Fireman back in feb. She wants to know if he should take the abx. Looked back at 10/11/14 note and saw that [place] recommended he take that abx if his sputum production changed or the color changed. (azithromyacin)

## 2014-11-28 NOTE — Telephone Encounter (Signed)
Pharmacy sent Rx request for Vancomycin 125mg  capsule, quantity 56, directions to take one capsule QID for 14 days. Please advise if Rx is appropriate.

## 2014-11-29 NOTE — Telephone Encounter (Signed)
Attempted to contact Pt, no answer. Left voicemail and provided callback information.

## 2014-12-02 ENCOUNTER — Other Ambulatory Visit: Payer: Self-pay | Admitting: *Deleted

## 2014-12-02 ENCOUNTER — Other Ambulatory Visit: Payer: Self-pay | Admitting: Family Medicine

## 2014-12-02 MED ORDER — ALBUTEROL SULFATE (2.5 MG/3ML) 0.083% IN NEBU: 2.5000 mg | INHALATION_SOLUTION | Freq: Four times a day (QID) | RESPIRATORY_TRACT | Status: DC | PRN

## 2014-12-05 DIAGNOSIS — J449 Chronic obstructive pulmonary disease, unspecified: Secondary | ICD-10-CM | POA: Diagnosis not present

## 2014-12-05 DIAGNOSIS — J9690 Respiratory failure, unspecified, unspecified whether with hypoxia or hypercapnia: Secondary | ICD-10-CM | POA: Diagnosis not present

## 2014-12-05 DIAGNOSIS — E872 Acidosis: Secondary | ICD-10-CM | POA: Diagnosis present

## 2014-12-05 DIAGNOSIS — Z9981 Dependence on supplemental oxygen: Secondary | ICD-10-CM | POA: Diagnosis not present

## 2014-12-05 DIAGNOSIS — R092 Respiratory arrest: Secondary | ICD-10-CM | POA: Diagnosis not present

## 2014-12-05 DIAGNOSIS — J9600 Acute respiratory failure, unspecified whether with hypoxia or hypercapnia: Secondary | ICD-10-CM | POA: Diagnosis not present

## 2014-12-05 DIAGNOSIS — I671 Cerebral aneurysm, nonruptured: Secondary | ICD-10-CM | POA: Diagnosis not present

## 2014-12-05 DIAGNOSIS — E119 Type 2 diabetes mellitus without complications: Secondary | ICD-10-CM | POA: Diagnosis present

## 2014-12-05 DIAGNOSIS — R410 Disorientation, unspecified: Secondary | ICD-10-CM | POA: Diagnosis not present

## 2014-12-05 DIAGNOSIS — E875 Hyperkalemia: Secondary | ICD-10-CM | POA: Diagnosis not present

## 2014-12-05 DIAGNOSIS — N179 Acute kidney failure, unspecified: Secondary | ICD-10-CM | POA: Diagnosis not present

## 2014-12-05 DIAGNOSIS — G9341 Metabolic encephalopathy: Secondary | ICD-10-CM | POA: Diagnosis not present

## 2014-12-05 DIAGNOSIS — I10 Essential (primary) hypertension: Secondary | ICD-10-CM | POA: Diagnosis not present

## 2014-12-05 DIAGNOSIS — J9620 Acute and chronic respiratory failure, unspecified whether with hypoxia or hypercapnia: Secondary | ICD-10-CM | POA: Diagnosis not present

## 2014-12-05 DIAGNOSIS — R079 Chest pain, unspecified: Secondary | ICD-10-CM | POA: Diagnosis not present

## 2014-12-05 DIAGNOSIS — Z87891 Personal history of nicotine dependence: Secondary | ICD-10-CM | POA: Diagnosis not present

## 2014-12-05 DIAGNOSIS — N139 Obstructive and reflux uropathy, unspecified: Secondary | ICD-10-CM | POA: Diagnosis not present

## 2014-12-05 DIAGNOSIS — J9621 Acute and chronic respiratory failure with hypoxia: Secondary | ICD-10-CM | POA: Diagnosis not present

## 2014-12-05 DIAGNOSIS — I480 Paroxysmal atrial fibrillation: Secondary | ICD-10-CM | POA: Diagnosis not present

## 2014-12-05 DIAGNOSIS — Z781 Physical restraint status: Secondary | ICD-10-CM | POA: Diagnosis not present

## 2014-12-05 DIAGNOSIS — Z8249 Family history of ischemic heart disease and other diseases of the circulatory system: Secondary | ICD-10-CM | POA: Diagnosis not present

## 2014-12-05 DIAGNOSIS — R0600 Dyspnea, unspecified: Secondary | ICD-10-CM | POA: Diagnosis not present

## 2014-12-05 DIAGNOSIS — R0602 Shortness of breath: Secondary | ICD-10-CM | POA: Diagnosis not present

## 2014-12-05 DIAGNOSIS — J441 Chronic obstructive pulmonary disease with (acute) exacerbation: Secondary | ICD-10-CM | POA: Diagnosis not present

## 2014-12-05 DIAGNOSIS — Z79899 Other long term (current) drug therapy: Secondary | ICD-10-CM | POA: Diagnosis not present

## 2014-12-05 DIAGNOSIS — I509 Heart failure, unspecified: Secondary | ICD-10-CM | POA: Diagnosis not present

## 2014-12-05 DIAGNOSIS — I2510 Atherosclerotic heart disease of native coronary artery without angina pectoris: Secondary | ICD-10-CM | POA: Diagnosis present

## 2014-12-05 DIAGNOSIS — N359 Urethral stricture, unspecified: Secondary | ICD-10-CM | POA: Diagnosis present

## 2014-12-05 DIAGNOSIS — H9190 Unspecified hearing loss, unspecified ear: Secondary | ICD-10-CM | POA: Diagnosis present

## 2014-12-05 LAB — BASIC METABOLIC PANEL
CREATININE: 0.8 mg/dL (ref 0.6–1.3)
Glucose: 268 mg/dL
POTASSIUM: 3.8 mmol/L (ref 3.4–5.3)
Sodium: 141 mmol/L (ref 137–147)

## 2014-12-05 LAB — CBC AND DIFFERENTIAL
Hemoglobin: 9.9 g/dL — AB (ref 13.5–17.5)
Platelets: 117 10*3/uL — AB (ref 150–399)
WBC: 10.5 10*3/mL

## 2014-12-12 LAB — CHLORIDE: Chloride: 102 mmol/L

## 2014-12-14 DIAGNOSIS — J441 Chronic obstructive pulmonary disease with (acute) exacerbation: Secondary | ICD-10-CM | POA: Diagnosis not present

## 2014-12-14 DIAGNOSIS — I2510 Atherosclerotic heart disease of native coronary artery without angina pectoris: Secondary | ICD-10-CM | POA: Diagnosis not present

## 2014-12-17 DIAGNOSIS — J441 Chronic obstructive pulmonary disease with (acute) exacerbation: Secondary | ICD-10-CM | POA: Diagnosis not present

## 2014-12-17 DIAGNOSIS — I2510 Atherosclerotic heart disease of native coronary artery without angina pectoris: Secondary | ICD-10-CM | POA: Diagnosis not present

## 2014-12-19 DIAGNOSIS — I2510 Atherosclerotic heart disease of native coronary artery without angina pectoris: Secondary | ICD-10-CM | POA: Diagnosis not present

## 2014-12-19 DIAGNOSIS — J441 Chronic obstructive pulmonary disease with (acute) exacerbation: Secondary | ICD-10-CM | POA: Diagnosis not present

## 2014-12-23 DIAGNOSIS — J309 Allergic rhinitis, unspecified: Secondary | ICD-10-CM | POA: Diagnosis not present

## 2014-12-23 DIAGNOSIS — M810 Age-related osteoporosis without current pathological fracture: Secondary | ICD-10-CM | POA: Diagnosis not present

## 2014-12-23 DIAGNOSIS — J42 Unspecified chronic bronchitis: Secondary | ICD-10-CM | POA: Diagnosis not present

## 2014-12-23 DIAGNOSIS — R0602 Shortness of breath: Secondary | ICD-10-CM | POA: Diagnosis not present

## 2014-12-24 DIAGNOSIS — J441 Chronic obstructive pulmonary disease with (acute) exacerbation: Secondary | ICD-10-CM | POA: Diagnosis not present

## 2014-12-24 DIAGNOSIS — I2510 Atherosclerotic heart disease of native coronary artery without angina pectoris: Secondary | ICD-10-CM | POA: Diagnosis not present

## 2014-12-26 ENCOUNTER — Ambulatory Visit: Payer: Medicare Other | Admitting: Family Medicine

## 2014-12-26 DIAGNOSIS — I2510 Atherosclerotic heart disease of native coronary artery without angina pectoris: Secondary | ICD-10-CM | POA: Diagnosis not present

## 2014-12-26 DIAGNOSIS — J441 Chronic obstructive pulmonary disease with (acute) exacerbation: Secondary | ICD-10-CM | POA: Diagnosis not present

## 2014-12-27 DIAGNOSIS — Z79899 Other long term (current) drug therapy: Secondary | ICD-10-CM | POA: Diagnosis not present

## 2014-12-27 DIAGNOSIS — J309 Allergic rhinitis, unspecified: Secondary | ICD-10-CM | POA: Diagnosis not present

## 2014-12-27 DIAGNOSIS — J449 Chronic obstructive pulmonary disease, unspecified: Secondary | ICD-10-CM | POA: Diagnosis not present

## 2014-12-29 ENCOUNTER — Other Ambulatory Visit: Payer: Self-pay | Admitting: Family Medicine

## 2014-12-31 ENCOUNTER — Other Ambulatory Visit: Payer: Self-pay | Admitting: Family Medicine

## 2014-12-31 DIAGNOSIS — J441 Chronic obstructive pulmonary disease with (acute) exacerbation: Secondary | ICD-10-CM | POA: Diagnosis not present

## 2014-12-31 DIAGNOSIS — I2510 Atherosclerotic heart disease of native coronary artery without angina pectoris: Secondary | ICD-10-CM | POA: Diagnosis not present

## 2015-01-07 ENCOUNTER — Encounter: Payer: Self-pay | Admitting: Family Medicine

## 2015-01-07 DIAGNOSIS — I671 Cerebral aneurysm, nonruptured: Secondary | ICD-10-CM | POA: Insufficient documentation

## 2015-01-14 ENCOUNTER — Telehealth: Payer: Self-pay | Admitting: Family Medicine

## 2015-01-14 ENCOUNTER — Other Ambulatory Visit: Payer: Self-pay | Admitting: Family Medicine

## 2015-01-14 ENCOUNTER — Encounter: Payer: Self-pay | Admitting: Family Medicine

## 2015-01-14 LAB — CALCIUM: CALCIUM: 7.9 mg/dL

## 2015-01-14 NOTE — Telephone Encounter (Signed)
Received fax for prior authorization on  ProAir HFA sent through cover my meds waiting on authorization. - CF

## 2015-01-15 ENCOUNTER — Encounter: Payer: Self-pay | Admitting: Family Medicine

## 2015-01-15 ENCOUNTER — Ambulatory Visit (INDEPENDENT_AMBULATORY_CARE_PROVIDER_SITE_OTHER): Payer: Medicare Other | Admitting: Family Medicine

## 2015-01-15 VITALS — BP 138/73 | HR 65 | Ht 71.0 in | Wt 138.0 lb

## 2015-01-15 DIAGNOSIS — I10 Essential (primary) hypertension: Secondary | ICD-10-CM | POA: Diagnosis not present

## 2015-01-15 DIAGNOSIS — J9621 Acute and chronic respiratory failure with hypoxia: Secondary | ICD-10-CM

## 2015-01-15 DIAGNOSIS — J441 Chronic obstructive pulmonary disease with (acute) exacerbation: Secondary | ICD-10-CM | POA: Diagnosis not present

## 2015-01-15 DIAGNOSIS — N179 Acute kidney failure, unspecified: Secondary | ICD-10-CM | POA: Diagnosis not present

## 2015-01-15 LAB — BASIC METABOLIC PANEL
BUN: 14 mg/dL (ref 6–23)
CHLORIDE: 99 meq/L (ref 96–112)
CO2: 29 mEq/L (ref 19–32)
CREATININE: 0.74 mg/dL (ref 0.50–1.35)
Calcium: 9.3 mg/dL (ref 8.4–10.5)
GLUCOSE: 83 mg/dL (ref 70–99)
Potassium: 4.6 mEq/L (ref 3.5–5.3)
Sodium: 136 mEq/L (ref 135–145)

## 2015-01-15 MED ORDER — IPRATROPIUM-ALBUTEROL 0.5-2.5 (3) MG/3ML IN SOLN: 3.0000 mL | Freq: Once | RESPIRATORY_TRACT | Status: DC

## 2015-01-15 MED ORDER — ALBUTEROL SULFATE HFA 108 (90 BASE) MCG/ACT IN AERS: INHALATION_SPRAY | RESPIRATORY_TRACT | Status: DC

## 2015-01-15 NOTE — Progress Notes (Signed)
   Subjective:    Patient ID: Jared Lee, male    DOB: 05-19-1941, 74 y.o.   MRN: [id number]  HPI Here today for hospital follow-up from [facility]. He was admitted on 12/05/2014. He unfortunately had an episode of acute on chronic hypoxic respiratory failure and respiratory arrest. He was intubated for a. Of time and was on mechanical ventilation. Fortunately, he was able to be weaned. He has severe end-stage COPD and is oxygen dependent. He did have acute renal failure which was felt to be secondary to dehydration. He also experienced encephalopathy that was a felt to be acute from being in intensive care unit. With his history of recurrent C. difficile they also gave him Flagyl while he was on an antibiotic during the hospitalization. He did refuse physical therapy and occupational therapy as an outpatient. And he declined referral to palliative care and hospice care.  Hypertension- Pt denies chest pain, SOB, dizziness, or heart palpitations.  Taking meds as directed w/o problems.  Denies medication side effects.      Review of Systems     Objective:   Physical Exam  Constitutional: He is oriented to person, place, and time. He appears well-developed and well-nourished.  HENT:  Head: Normocephalic and atraumatic.  Cardiovascular: Normal rate, regular rhythm and normal heart sounds.   Pulmonary/Chest: Effort normal and breath sounds normal.  + course BS  Neurological: He is alert and oriented to person, place, and time.  Skin: Skin is warm and dry.  Psychiatric: He has a normal mood and affect. His behavior is normal.          Assessment & Plan:  Acute on chronic hypoxic respiratory failure-overall he is back to baseline. As usual he is not wearing his oxygen during the appointment. He typically leaves the oxygen in his car. It's a little bit confusing which inhalers he's actually using. Unfortunately, every time he gets hospitalized his inhalers gets switched. I  think they may have a different formulary. Some not exactly clear which ones he is actually using. He is on 10 mg of prednisone daily. Been nebulous her treatment here in the office. Based on our records he is on Symbicort twice a day and uses his Combivent 4 times a day. So he is on a long-acting albuterol, and he'll quit a steroid, and anticholinergic, and a quick acting albuterol. We asked to try him on Daliresp in the past and it caused diarrhea. And right now he is on an extended course of low-dose prednisone.  Refill Provera today. He is planning on following up with pulmonology. He's only been once before.  Follow-up acute kidney injury-he did have acute renal failure upon admission. Would like to recheck BUN/creatinine today to make sure that she is staying well-hydrated and kidney function is back to baseline.  Hypertension-well-controlled on current regimen. Refill lisinopril yesterday.

## 2015-01-16 ENCOUNTER — Telehealth: Payer: Self-pay | Admitting: Family Medicine

## 2015-01-16 ENCOUNTER — Other Ambulatory Visit: Payer: Self-pay | Admitting: Family Medicine

## 2015-01-16 MED ORDER — VENTOLIN HFA 108 (90 BASE) MCG/ACT IN AERS: 2.0000 | INHALATION_SPRAY | Freq: Four times a day (QID) | RESPIRATORY_TRACT | Status: DC | PRN

## 2015-01-16 NOTE — Telephone Encounter (Signed)
Received a fax from [place] wanting [place] to use an alternative for the med or give the reason why the ProAir HFA would be better - CF

## 2015-01-16 NOTE — Telephone Encounter (Signed)
Prescription sent

## 2015-01-16 NOTE — Telephone Encounter (Signed)
Left message for Pt, insurance will not cover PROAIR HFA. They will cover: Ventoline HFA (quantity limit 2 inhalers per 30 days) or Xopenex HFA (quantity limit 2 inhalers per 30 days). Advised Pt to return clinic call to see if he has a preference.

## 2015-01-16 NOTE — Telephone Encounter (Signed)
Returned clinic call, is fine with either option for new Rx.

## 2015-01-16 NOTE — Telephone Encounter (Signed)
Medication is going to be changed to one on his formulary because insurance will not cover proair until he tries 2 alternatives. - CF

## 2015-01-16 NOTE — Progress Notes (Signed)
Quick Note:  All labs are normal. ______ 

## 2015-01-17 ENCOUNTER — Other Ambulatory Visit: Payer: Self-pay | Admitting: Family Medicine

## 2015-01-17 ENCOUNTER — Other Ambulatory Visit: Payer: Self-pay

## 2015-01-17 DIAGNOSIS — I10 Essential (primary) hypertension: Secondary | ICD-10-CM

## 2015-01-17 MED ORDER — ESOMEPRAZOLE MAGNESIUM 20 MG PO CPDR: DELAYED_RELEASE_CAPSULE | ORAL | Status: DC

## 2015-01-21 DIAGNOSIS — J209 Acute bronchitis, unspecified: Secondary | ICD-10-CM | POA: Diagnosis not present

## 2015-01-21 DIAGNOSIS — J42 Unspecified chronic bronchitis: Secondary | ICD-10-CM | POA: Diagnosis not present

## 2015-01-23 DIAGNOSIS — R112 Nausea with vomiting, unspecified: Secondary | ICD-10-CM | POA: Diagnosis not present

## 2015-01-23 DIAGNOSIS — J449 Chronic obstructive pulmonary disease, unspecified: Secondary | ICD-10-CM | POA: Diagnosis not present

## 2015-01-23 DIAGNOSIS — J309 Allergic rhinitis, unspecified: Secondary | ICD-10-CM | POA: Diagnosis not present

## 2015-01-29 DIAGNOSIS — R0602 Shortness of breath: Secondary | ICD-10-CM | POA: Diagnosis not present

## 2015-01-29 DIAGNOSIS — R0789 Other chest pain: Secondary | ICD-10-CM | POA: Diagnosis not present

## 2015-01-29 DIAGNOSIS — J440 Chronic obstructive pulmonary disease with acute lower respiratory infection: Secondary | ICD-10-CM | POA: Diagnosis not present

## 2015-01-29 DIAGNOSIS — J209 Acute bronchitis, unspecified: Secondary | ICD-10-CM | POA: Diagnosis present

## 2015-01-29 DIAGNOSIS — A047 Enterocolitis due to Clostridium difficile: Secondary | ICD-10-CM | POA: Diagnosis not present

## 2015-01-29 DIAGNOSIS — R197 Diarrhea, unspecified: Secondary | ICD-10-CM | POA: Diagnosis not present

## 2015-01-29 DIAGNOSIS — R05 Cough: Secondary | ICD-10-CM | POA: Diagnosis not present

## 2015-01-29 DIAGNOSIS — J9611 Chronic respiratory failure with hypoxia: Secondary | ICD-10-CM | POA: Diagnosis not present

## 2015-01-29 DIAGNOSIS — J9621 Acute and chronic respiratory failure with hypoxia: Secondary | ICD-10-CM | POA: Diagnosis not present

## 2015-01-29 DIAGNOSIS — I10 Essential (primary) hypertension: Secondary | ICD-10-CM | POA: Diagnosis not present

## 2015-01-29 DIAGNOSIS — J441 Chronic obstructive pulmonary disease with (acute) exacerbation: Secondary | ICD-10-CM | POA: Diagnosis not present

## 2015-01-29 DIAGNOSIS — J9612 Chronic respiratory failure with hypercapnia: Secondary | ICD-10-CM | POA: Diagnosis present

## 2015-02-07 ENCOUNTER — Ambulatory Visit: Payer: Medicare Other | Admitting: Family Medicine

## 2015-02-17 ENCOUNTER — Other Ambulatory Visit: Payer: Self-pay | Admitting: Family Medicine

## 2015-02-24 DIAGNOSIS — J449 Chronic obstructive pulmonary disease, unspecified: Secondary | ICD-10-CM | POA: Diagnosis not present

## 2015-02-24 DIAGNOSIS — R0602 Shortness of breath: Secondary | ICD-10-CM | POA: Diagnosis not present

## 2015-02-24 DIAGNOSIS — Z87891 Personal history of nicotine dependence: Secondary | ICD-10-CM | POA: Diagnosis not present

## 2015-02-24 DIAGNOSIS — R079 Chest pain, unspecified: Secondary | ICD-10-CM | POA: Diagnosis not present

## 2015-02-26 ENCOUNTER — Other Ambulatory Visit: Payer: Self-pay | Admitting: Family Medicine

## 2015-03-18 ENCOUNTER — Other Ambulatory Visit: Payer: Self-pay | Admitting: Family Medicine

## 2015-03-18 ENCOUNTER — Ambulatory Visit: Payer: Medicare Other | Admitting: Family Medicine

## 2015-03-20 DIAGNOSIS — R112 Nausea with vomiting, unspecified: Secondary | ICD-10-CM | POA: Diagnosis not present

## 2015-03-20 DIAGNOSIS — J449 Chronic obstructive pulmonary disease, unspecified: Secondary | ICD-10-CM | POA: Diagnosis not present

## 2015-03-20 DIAGNOSIS — Z9981 Dependence on supplemental oxygen: Secondary | ICD-10-CM | POA: Diagnosis not present

## 2015-03-20 DIAGNOSIS — J40 Bronchitis, not specified as acute or chronic: Secondary | ICD-10-CM | POA: Diagnosis not present

## 2015-03-20 DIAGNOSIS — R0602 Shortness of breath: Secondary | ICD-10-CM | POA: Diagnosis not present

## 2015-03-20 DIAGNOSIS — Z87891 Personal history of nicotine dependence: Secondary | ICD-10-CM | POA: Diagnosis not present

## 2015-03-20 DIAGNOSIS — I10 Essential (primary) hypertension: Secondary | ICD-10-CM | POA: Diagnosis not present

## 2015-03-20 DIAGNOSIS — E785 Hyperlipidemia, unspecified: Secondary | ICD-10-CM | POA: Diagnosis not present

## 2015-03-20 DIAGNOSIS — Z79899 Other long term (current) drug therapy: Secondary | ICD-10-CM | POA: Diagnosis not present

## 2015-03-20 DIAGNOSIS — R05 Cough: Secondary | ICD-10-CM | POA: Diagnosis not present

## 2015-03-20 DIAGNOSIS — R197 Diarrhea, unspecified: Secondary | ICD-10-CM | POA: Diagnosis not present

## 2015-03-21 ENCOUNTER — Ambulatory Visit: Payer: Medicare Other | Admitting: Family Medicine

## 2015-03-23 ENCOUNTER — Other Ambulatory Visit: Payer: Self-pay | Admitting: Family Medicine

## 2015-03-25 ENCOUNTER — Other Ambulatory Visit: Payer: Self-pay | Admitting: Family Medicine

## 2015-03-25 MED ORDER — ALBUTEROL SULFATE (2.5 MG/3ML) 0.083% IN NEBU: INHALATION_SOLUTION | RESPIRATORY_TRACT | Status: DC

## 2015-03-26 ENCOUNTER — Other Ambulatory Visit: Payer: Self-pay | Admitting: Family Medicine

## 2015-03-26 MED ORDER — ALBUTEROL SULFATE (2.5 MG/3ML) 0.083% IN NEBU: INHALATION_SOLUTION | RESPIRATORY_TRACT | Status: DC

## 2015-03-26 NOTE — Telephone Encounter (Signed)
Pharmacy discarded last Rx thinking it was a duplicate order, will resend with DX code to ensure insurance coverage.

## 2015-04-09 ENCOUNTER — Other Ambulatory Visit: Payer: Self-pay | Admitting: Family Medicine

## 2015-04-10 DIAGNOSIS — Z87891 Personal history of nicotine dependence: Secondary | ICD-10-CM | POA: Diagnosis not present

## 2015-04-10 DIAGNOSIS — Z9981 Dependence on supplemental oxygen: Secondary | ICD-10-CM | POA: Diagnosis not present

## 2015-04-10 DIAGNOSIS — Z79899 Other long term (current) drug therapy: Secondary | ICD-10-CM | POA: Diagnosis not present

## 2015-04-10 DIAGNOSIS — R51 Headache: Secondary | ICD-10-CM | POA: Diagnosis not present

## 2015-04-10 DIAGNOSIS — J449 Chronic obstructive pulmonary disease, unspecified: Secondary | ICD-10-CM | POA: Diagnosis not present

## 2015-04-10 DIAGNOSIS — J439 Emphysema, unspecified: Secondary | ICD-10-CM | POA: Diagnosis not present

## 2015-04-10 DIAGNOSIS — R112 Nausea with vomiting, unspecified: Secondary | ICD-10-CM | POA: Diagnosis not present

## 2015-04-10 DIAGNOSIS — R197 Diarrhea, unspecified: Secondary | ICD-10-CM | POA: Diagnosis not present

## 2015-04-10 DIAGNOSIS — J441 Chronic obstructive pulmonary disease with (acute) exacerbation: Secondary | ICD-10-CM | POA: Diagnosis not present

## 2015-04-10 DIAGNOSIS — I10 Essential (primary) hypertension: Secondary | ICD-10-CM | POA: Diagnosis not present

## 2015-04-10 DIAGNOSIS — E785 Hyperlipidemia, unspecified: Secondary | ICD-10-CM | POA: Diagnosis not present

## 2015-04-10 DIAGNOSIS — F419 Anxiety disorder, unspecified: Secondary | ICD-10-CM | POA: Diagnosis not present

## 2015-04-10 DIAGNOSIS — R0602 Shortness of breath: Secondary | ICD-10-CM | POA: Diagnosis not present

## 2015-04-10 DIAGNOSIS — J9621 Acute and chronic respiratory failure with hypoxia: Secondary | ICD-10-CM | POA: Diagnosis not present

## 2015-04-11 ENCOUNTER — Other Ambulatory Visit: Payer: Self-pay | Admitting: Family Medicine

## 2015-04-11 DIAGNOSIS — J9611 Chronic respiratory failure with hypoxia: Secondary | ICD-10-CM | POA: Diagnosis not present

## 2015-04-11 DIAGNOSIS — J9621 Acute and chronic respiratory failure with hypoxia: Secondary | ICD-10-CM | POA: Diagnosis not present

## 2015-04-11 DIAGNOSIS — I10 Essential (primary) hypertension: Secondary | ICD-10-CM | POA: Diagnosis not present

## 2015-04-12 DIAGNOSIS — I10 Essential (primary) hypertension: Secondary | ICD-10-CM | POA: Diagnosis not present

## 2015-04-12 DIAGNOSIS — J9611 Chronic respiratory failure with hypoxia: Secondary | ICD-10-CM | POA: Diagnosis not present

## 2015-04-12 DIAGNOSIS — R197 Diarrhea, unspecified: Secondary | ICD-10-CM | POA: Diagnosis not present

## 2015-04-12 DIAGNOSIS — J9621 Acute and chronic respiratory failure with hypoxia: Secondary | ICD-10-CM | POA: Diagnosis not present

## 2015-04-13 DIAGNOSIS — J9611 Chronic respiratory failure with hypoxia: Secondary | ICD-10-CM | POA: Diagnosis not present

## 2015-04-13 DIAGNOSIS — I10 Essential (primary) hypertension: Secondary | ICD-10-CM | POA: Diagnosis not present

## 2015-04-13 DIAGNOSIS — J9621 Acute and chronic respiratory failure with hypoxia: Secondary | ICD-10-CM | POA: Diagnosis not present

## 2015-04-13 DIAGNOSIS — R197 Diarrhea, unspecified: Secondary | ICD-10-CM | POA: Diagnosis not present

## 2015-04-21 ENCOUNTER — Ambulatory Visit: Payer: Medicare Other | Admitting: Family Medicine

## 2015-04-29 ENCOUNTER — Ambulatory Visit (INDEPENDENT_AMBULATORY_CARE_PROVIDER_SITE_OTHER): Payer: Medicare Other | Admitting: Family Medicine

## 2015-04-29 ENCOUNTER — Encounter: Payer: Self-pay | Admitting: Family Medicine

## 2015-04-29 VITALS — BP 139/72 | HR 79 | Temp 97.5°F | Wt 136.0 lb

## 2015-04-29 DIAGNOSIS — R0789 Other chest pain: Secondary | ICD-10-CM | POA: Diagnosis not present

## 2015-04-29 DIAGNOSIS — T50905A Adverse effect of unspecified drugs, medicaments and biological substances, initial encounter: Secondary | ICD-10-CM

## 2015-04-29 DIAGNOSIS — E099 Drug or chemical induced diabetes mellitus without complications: Secondary | ICD-10-CM

## 2015-04-29 DIAGNOSIS — R10812 Left upper quadrant abdominal tenderness: Secondary | ICD-10-CM

## 2015-04-29 DIAGNOSIS — I509 Heart failure, unspecified: Secondary | ICD-10-CM

## 2015-04-29 DIAGNOSIS — J441 Chronic obstructive pulmonary disease with (acute) exacerbation: Secondary | ICD-10-CM | POA: Diagnosis not present

## 2015-04-29 DIAGNOSIS — I671 Cerebral aneurysm, nonruptured: Secondary | ICD-10-CM

## 2015-04-29 LAB — POCT GLYCOSYLATED HEMOGLOBIN (HGB A1C): Hemoglobin A1C: 6.2

## 2015-04-29 MED ORDER — FERROUS SULFATE 325 (65 FE) MG PO TABS: ORAL_TABLET | ORAL | Status: DC

## 2015-04-29 MED ORDER — IPRATROPIUM-ALBUTEROL 0.5-2.5 (3) MG/3ML IN SOLN
3.0000 mL | Freq: Once | RESPIRATORY_TRACT | Status: AC
  Administered 2015-04-29: 3 mL via RESPIRATORY_TRACT

## 2015-04-29 NOTE — Progress Notes (Signed)
Subjective:    Patient ID: Jared Lee, male    DOB: 1941-07-19, 74 y.o.   MRN: [id number]  HPI COPD - More SOB and Chest pain started in the last week.  Has also had some stomach pain while on recent course of augmentin. Though he is not sure why.  He stopped it after 2 days and started to feel better.  Stools were loose as well but that has been better this week since completing his antibiotic.  + runny nose.   Feels more SOB when it is hot.  No fever or chills this week.  No recent swelling.  When I looked through care every where he was placed on doxycycline on July 21 for COPD exacerbation with acute bronchitis. He says he was hospitalized at [facility] back in March for a very fast heart rate. He also complains of some mid chest discomfort that he says feels like a squeezing sensation. He was not able to quantify how long it lasts or if it radiates or about the intensity of it. I asked him several questions but I don't think he could actually hear me well and not to answer the questions.  He is here by himself today. His son often comes with him but could not make it today. He is extremely hard of hearing which makes it somewhat difficult to communicate.   Diabetes - no hypoglycemic events. No wounds or sores that are not healing well. No increased thirst or urination. Taking medications as prescribed without any side effects. On glipizide.     Review of Systems     Objective:   Physical Exam  Constitutional: He is oriented to person, place, and time. He appears well-developed and well-nourished.  HENT:  Head: Normocephalic and atraumatic.  Cardiovascular: Normal rate, regular rhythm and normal heart sounds.   Pulmonary/Chest: Effort normal and breath sounds normal.  Abdominal: Bowel sounds are normal. He exhibits no distension and no mass. There is tenderness. There is no rebound and no guarding.  Tender in the LUQ.    Musculoskeletal: He exhibits no edema.  He doesn't look volume  overloaded today.   Neurological: He is alert and oriented to person, place, and time.  Skin: Skin is warm and dry.  Psychiatric: He has a normal mood and affect. His behavior is normal.          Assessment & Plan:  COPD - follow-up from recent exacerbation-his lung exam is back to baseline. His pulse ox was low when he initially got here but as usual he left his oxygen in the car and did not bring it in with him. I think he might be embarrassed to wear it in public. We did give him an albuterol treatment here in the office and his oxygen level came up to 93% on room air. We'll go ahead and check a CBC with differential as well. We did go ahead and add Augmentin to his intolerance list.  Atypical chest pain - unclear etiology. I really couldn't get a good hx of Pain. Will check cardiac enzyme as I don't think truly cardiac.  EKG today shows rate of 143 bpm.  This was after the albuterol treatment. Repeat pulse before he left was 78.    LUQ tenderness - his pain and nausea and vomiting and diarrhea are actually better since being off of the Augmentin but he is still tender in the left upper quadrant some going to do some additional blood work including a CBC  amylase and lipase just to make sure that everything is resolving.  Medication side effects with nausea vomiting and stomach pain and diarrhea will add to his intolerance list.  Drug induced DM- hemoglobin A1c looks fantastic at 6.2 today. Continue current regimen and recheck in 3 months. He is on a low-dose on glipizide.  He also notes that he was diagnosed with a middle cerebral artery aneurysm back in March. They had discussed with him potential for surgery but they weren't sure that he would do well and so recommended just symptomatic treatment.

## 2015-04-30 LAB — COMPLETE METABOLIC PANEL WITH GFR
ALK PHOS: 59 U/L (ref 40–115)
ALT: 15 U/L (ref 9–46)
AST: 22 U/L (ref 10–35)
Albumin: 4 g/dL (ref 3.6–5.1)
BILIRUBIN TOTAL: 0.4 mg/dL (ref 0.2–1.2)
BUN: 23 mg/dL (ref 7–25)
CO2: 29 mmol/L (ref 20–31)
Calcium: 8.9 mg/dL (ref 8.6–10.3)
Chloride: 101 mmol/L (ref 98–110)
Creat: 0.95 mg/dL (ref 0.70–1.18)
GFR, EST NON AFRICAN AMERICAN: 77 mL/min (ref >=60)
Glucose, Bld: 46 mg/dL — ABNORMAL LOW (ref 65–99)
Potassium: 4.4 mmol/L (ref 3.5–5.3)
Sodium: 142 mmol/L (ref 135–146)
TOTAL PROTEIN: 6 g/dL — AB (ref 6.1–8.1)

## 2015-04-30 LAB — CK: CK TOTAL: 29 U/L (ref 7–232)

## 2015-04-30 LAB — CBC WITH DIFFERENTIAL/PLATELET
BASOS ABS: 0 10*3/uL (ref 0.0–0.1)
Basophils Relative: 0 % (ref 0–1)
EOS ABS: 0.4 10*3/uL (ref 0.0–0.7)
EOS PCT: 5 % (ref 0–5)
HCT: 39.5 % (ref 39.0–52.0)
Hemoglobin: 13 g/dL (ref 13.0–17.0)
Lymphocytes Relative: 19 % (ref 12–46)
Lymphs Abs: 1.4 10*3/uL (ref 0.7–4.0)
MCH: 27.5 pg (ref 26.0–34.0)
MCHC: 32.9 g/dL (ref 30.0–36.0)
MCV: 83.5 fL (ref 78.0–100.0)
MPV: 10.6 fL (ref 8.6–12.4)
Monocytes Absolute: 0.6 10*3/uL (ref 0.1–1.0)
Monocytes Relative: 9 % (ref 3–12)
Neutro Abs: 4.8 10*3/uL (ref 1.7–7.7)
Neutrophils Relative %: 67 % (ref 43–77)
PLATELETS: 158 10*3/uL (ref 150–400)
RBC: 4.73 MIL/uL (ref 4.22–5.81)
RDW: 15.7 % — AB (ref 11.5–15.5)
WBC: 7.2 10*3/uL (ref 4.0–10.5)

## 2015-04-30 LAB — LIPASE: LIPASE: 32 U/L (ref 7–60)

## 2015-04-30 LAB — BRAIN NATRIURETIC PEPTIDE: BRAIN NATRIURETIC PEPTIDE: 36.3 pg/mL (ref 0.0–100.0)

## 2015-04-30 LAB — AMYLASE: AMYLASE: 76 U/L (ref 0–105)

## 2015-05-01 LAB — TROPONIN I

## 2015-05-13 ENCOUNTER — Other Ambulatory Visit: Payer: Self-pay | Admitting: Family Medicine

## 2015-05-13 NOTE — Telephone Encounter (Signed)
Due for f/u around November. Will need fasting labs. No recent lipid profile on file

## 2015-05-19 DIAGNOSIS — J449 Chronic obstructive pulmonary disease, unspecified: Secondary | ICD-10-CM | POA: Diagnosis not present

## 2015-05-19 DIAGNOSIS — R197 Diarrhea, unspecified: Secondary | ICD-10-CM | POA: Diagnosis not present

## 2015-05-19 DIAGNOSIS — R05 Cough: Secondary | ICD-10-CM | POA: Diagnosis not present

## 2015-05-19 DIAGNOSIS — J441 Chronic obstructive pulmonary disease with (acute) exacerbation: Secondary | ICD-10-CM | POA: Diagnosis not present

## 2015-05-19 DIAGNOSIS — E119 Type 2 diabetes mellitus without complications: Secondary | ICD-10-CM | POA: Diagnosis not present

## 2015-05-19 DIAGNOSIS — I10 Essential (primary) hypertension: Secondary | ICD-10-CM | POA: Diagnosis not present

## 2015-05-19 DIAGNOSIS — Z87891 Personal history of nicotine dependence: Secondary | ICD-10-CM | POA: Diagnosis not present

## 2015-05-19 DIAGNOSIS — J9610 Chronic respiratory failure, unspecified whether with hypoxia or hypercapnia: Secondary | ICD-10-CM | POA: Diagnosis not present

## 2015-05-19 DIAGNOSIS — A047 Enterocolitis due to Clostridium difficile: Secondary | ICD-10-CM | POA: Diagnosis not present

## 2015-05-19 DIAGNOSIS — R5381 Other malaise: Secondary | ICD-10-CM | POA: Diagnosis not present

## 2015-05-19 DIAGNOSIS — Z7982 Long term (current) use of aspirin: Secondary | ICD-10-CM | POA: Diagnosis not present

## 2015-05-19 DIAGNOSIS — R0602 Shortness of breath: Secondary | ICD-10-CM | POA: Diagnosis not present

## 2015-05-19 DIAGNOSIS — R079 Chest pain, unspecified: Secondary | ICD-10-CM | POA: Diagnosis not present

## 2015-05-19 DIAGNOSIS — Z79899 Other long term (current) drug therapy: Secondary | ICD-10-CM | POA: Diagnosis not present

## 2015-05-19 DIAGNOSIS — H9190 Unspecified hearing loss, unspecified ear: Secondary | ICD-10-CM | POA: Diagnosis not present

## 2015-05-20 DIAGNOSIS — E119 Type 2 diabetes mellitus without complications: Secondary | ICD-10-CM | POA: Diagnosis not present

## 2015-05-20 DIAGNOSIS — J441 Chronic obstructive pulmonary disease with (acute) exacerbation: Secondary | ICD-10-CM | POA: Diagnosis not present

## 2015-05-20 DIAGNOSIS — I10 Essential (primary) hypertension: Secondary | ICD-10-CM | POA: Diagnosis not present

## 2015-05-21 DIAGNOSIS — E119 Type 2 diabetes mellitus without complications: Secondary | ICD-10-CM | POA: Diagnosis not present

## 2015-05-21 DIAGNOSIS — A047 Enterocolitis due to Clostridium difficile: Secondary | ICD-10-CM | POA: Diagnosis not present

## 2015-05-21 DIAGNOSIS — I10 Essential (primary) hypertension: Secondary | ICD-10-CM | POA: Diagnosis not present

## 2015-05-21 DIAGNOSIS — J441 Chronic obstructive pulmonary disease with (acute) exacerbation: Secondary | ICD-10-CM | POA: Diagnosis not present

## 2015-05-22 DIAGNOSIS — I10 Essential (primary) hypertension: Secondary | ICD-10-CM | POA: Diagnosis not present

## 2015-05-22 DIAGNOSIS — E119 Type 2 diabetes mellitus without complications: Secondary | ICD-10-CM | POA: Diagnosis not present

## 2015-05-22 DIAGNOSIS — R197 Diarrhea, unspecified: Secondary | ICD-10-CM | POA: Diagnosis not present

## 2015-05-22 DIAGNOSIS — J441 Chronic obstructive pulmonary disease with (acute) exacerbation: Secondary | ICD-10-CM | POA: Diagnosis not present

## 2015-05-29 ENCOUNTER — Ambulatory Visit: Payer: Medicare Other | Admitting: Family Medicine

## 2015-06-04 DIAGNOSIS — J449 Chronic obstructive pulmonary disease, unspecified: Secondary | ICD-10-CM | POA: Diagnosis not present

## 2015-06-04 DIAGNOSIS — R05 Cough: Secondary | ICD-10-CM | POA: Diagnosis not present

## 2015-06-04 DIAGNOSIS — J441 Chronic obstructive pulmonary disease with (acute) exacerbation: Secondary | ICD-10-CM | POA: Diagnosis not present

## 2015-06-04 DIAGNOSIS — J9622 Acute and chronic respiratory failure with hypercapnia: Secondary | ICD-10-CM | POA: Diagnosis not present

## 2015-06-04 DIAGNOSIS — A047 Enterocolitis due to Clostridium difficile: Secondary | ICD-10-CM | POA: Diagnosis not present

## 2015-06-04 DIAGNOSIS — R0602 Shortness of breath: Secondary | ICD-10-CM | POA: Diagnosis not present

## 2015-06-04 DIAGNOSIS — J9611 Chronic respiratory failure with hypoxia: Secondary | ICD-10-CM | POA: Diagnosis not present

## 2015-06-04 DIAGNOSIS — Z23 Encounter for immunization: Secondary | ICD-10-CM | POA: Diagnosis not present

## 2015-06-04 DIAGNOSIS — J9612 Chronic respiratory failure with hypercapnia: Secondary | ICD-10-CM | POA: Diagnosis not present

## 2015-06-04 DIAGNOSIS — I10 Essential (primary) hypertension: Secondary | ICD-10-CM | POA: Diagnosis not present

## 2015-06-04 DIAGNOSIS — E119 Type 2 diabetes mellitus without complications: Secondary | ICD-10-CM | POA: Diagnosis not present

## 2015-06-04 DIAGNOSIS — J9621 Acute and chronic respiratory failure with hypoxia: Secondary | ICD-10-CM | POA: Diagnosis not present

## 2015-06-04 LAB — CBC AND DIFFERENTIAL
HCT: 43 % (ref 41–53)
HEMOGLOBIN: 14.5 g/dL (ref 13.5–17.5)
Platelets: 159 10*3/uL (ref 150–399)
WBC: 7.9 10^3/mL

## 2015-06-04 LAB — BASIC METABOLIC PANEL
BUN: 28 mg/dL — AB (ref 4–21)
CREATININE: 1.1 mg/dL (ref 0.6–1.3)
Potassium: 4.5 mmol/L (ref 3.4–5.3)

## 2015-06-04 LAB — HEPATIC FUNCTION PANEL
ALK PHOS: 81 U/L (ref 25–125)
ALT: 19 U/L (ref 10–40)
AST: 27 U/L (ref 14–40)
Bilirubin, Total: 0.4 mg/dL

## 2015-06-04 LAB — POCT INR: INR: 1 (ref 0.9–1.1)

## 2015-06-05 DIAGNOSIS — J9622 Acute and chronic respiratory failure with hypercapnia: Secondary | ICD-10-CM | POA: Diagnosis present

## 2015-06-05 DIAGNOSIS — H9190 Unspecified hearing loss, unspecified ear: Secondary | ICD-10-CM | POA: Diagnosis present

## 2015-06-05 DIAGNOSIS — J9621 Acute and chronic respiratory failure with hypoxia: Secondary | ICD-10-CM | POA: Diagnosis present

## 2015-06-05 DIAGNOSIS — R0602 Shortness of breath: Secondary | ICD-10-CM | POA: Diagnosis not present

## 2015-06-05 DIAGNOSIS — Z9981 Dependence on supplemental oxygen: Secondary | ICD-10-CM | POA: Diagnosis not present

## 2015-06-05 DIAGNOSIS — Z23 Encounter for immunization: Secondary | ICD-10-CM | POA: Diagnosis not present

## 2015-06-05 DIAGNOSIS — J441 Chronic obstructive pulmonary disease with (acute) exacerbation: Secondary | ICD-10-CM | POA: Diagnosis not present

## 2015-06-05 DIAGNOSIS — I10 Essential (primary) hypertension: Secondary | ICD-10-CM | POA: Diagnosis not present

## 2015-06-05 DIAGNOSIS — F419 Anxiety disorder, unspecified: Secondary | ICD-10-CM | POA: Diagnosis present

## 2015-06-05 DIAGNOSIS — E785 Hyperlipidemia, unspecified: Secondary | ICD-10-CM | POA: Diagnosis present

## 2015-06-05 DIAGNOSIS — A047 Enterocolitis due to Clostridium difficile: Secondary | ICD-10-CM | POA: Diagnosis not present

## 2015-06-05 DIAGNOSIS — J9601 Acute respiratory failure with hypoxia: Secondary | ICD-10-CM | POA: Diagnosis not present

## 2015-06-05 DIAGNOSIS — J9612 Chronic respiratory failure with hypercapnia: Secondary | ICD-10-CM | POA: Diagnosis present

## 2015-06-05 DIAGNOSIS — E119 Type 2 diabetes mellitus without complications: Secondary | ICD-10-CM | POA: Diagnosis not present

## 2015-06-05 DIAGNOSIS — Z87891 Personal history of nicotine dependence: Secondary | ICD-10-CM | POA: Diagnosis not present

## 2015-06-10 ENCOUNTER — Ambulatory Visit: Payer: Medicare Other | Admitting: Family Medicine

## 2015-06-14 ENCOUNTER — Other Ambulatory Visit: Payer: Self-pay | Admitting: Family Medicine

## 2015-06-21 ENCOUNTER — Other Ambulatory Visit: Payer: Self-pay | Admitting: Family Medicine

## 2015-06-24 ENCOUNTER — Encounter: Payer: Self-pay | Admitting: Family Medicine

## 2015-06-24 LAB — PT AND PTT
APTT: 33.7
INR: 1
PT: 13.1

## 2015-06-24 LAB — COMPREHENSIVE METABOLIC PANEL
ALBUMIN - PEBFLD: 3.6
ALK PHOS: 81 U/L
ALT: 19
AST: 27 U/L
BILIRUBIN TOTAL: 0.4 mg/dL
BUN: 28
CALCIUM: 8.9 mg/dL
CO2: 26 mmol/L
Chloride: 104 mmol/L
Creat: 1.06
MAGNESIUM: 2.1 mg/dL (ref 1.6–2.4)
PHOSPHORUS: 2.7 mg/dL (ref 2.5–4.9)
PROTEIN: 7
Potassium: 4.8 mmol/L
Sodium: 147

## 2015-06-26 DIAGNOSIS — I10 Essential (primary) hypertension: Secondary | ICD-10-CM | POA: Diagnosis not present

## 2015-06-26 DIAGNOSIS — R05 Cough: Secondary | ICD-10-CM | POA: Diagnosis not present

## 2015-06-26 DIAGNOSIS — E440 Moderate protein-calorie malnutrition: Secondary | ICD-10-CM | POA: Diagnosis not present

## 2015-06-26 DIAGNOSIS — J9611 Chronic respiratory failure with hypoxia: Secondary | ICD-10-CM | POA: Diagnosis not present

## 2015-06-26 DIAGNOSIS — Z681 Body mass index (BMI) 19 or less, adult: Secondary | ICD-10-CM | POA: Diagnosis not present

## 2015-06-26 DIAGNOSIS — E118 Type 2 diabetes mellitus with unspecified complications: Secondary | ICD-10-CM | POA: Diagnosis not present

## 2015-06-26 DIAGNOSIS — J441 Chronic obstructive pulmonary disease with (acute) exacerbation: Secondary | ICD-10-CM | POA: Diagnosis not present

## 2015-06-26 DIAGNOSIS — H9190 Unspecified hearing loss, unspecified ear: Secondary | ICD-10-CM | POA: Diagnosis not present

## 2015-06-27 DIAGNOSIS — H9190 Unspecified hearing loss, unspecified ear: Secondary | ICD-10-CM | POA: Diagnosis not present

## 2015-06-27 DIAGNOSIS — Z9981 Dependence on supplemental oxygen: Secondary | ICD-10-CM | POA: Diagnosis not present

## 2015-06-27 DIAGNOSIS — J441 Chronic obstructive pulmonary disease with (acute) exacerbation: Secondary | ICD-10-CM | POA: Diagnosis not present

## 2015-06-27 DIAGNOSIS — Z681 Body mass index (BMI) 19 or less, adult: Secondary | ICD-10-CM | POA: Diagnosis not present

## 2015-06-27 DIAGNOSIS — E119 Type 2 diabetes mellitus without complications: Secondary | ICD-10-CM | POA: Diagnosis not present

## 2015-06-27 DIAGNOSIS — Z7982 Long term (current) use of aspirin: Secondary | ICD-10-CM | POA: Diagnosis not present

## 2015-06-27 DIAGNOSIS — F419 Anxiety disorder, unspecified: Secondary | ICD-10-CM | POA: Diagnosis present

## 2015-06-27 DIAGNOSIS — J449 Chronic obstructive pulmonary disease, unspecified: Secondary | ICD-10-CM | POA: Diagnosis not present

## 2015-06-27 DIAGNOSIS — Z87891 Personal history of nicotine dependence: Secondary | ICD-10-CM | POA: Diagnosis not present

## 2015-06-27 DIAGNOSIS — E440 Moderate protein-calorie malnutrition: Secondary | ICD-10-CM | POA: Diagnosis present

## 2015-06-27 DIAGNOSIS — Z888 Allergy status to other drugs, medicaments and biological substances status: Secondary | ICD-10-CM | POA: Diagnosis not present

## 2015-06-27 DIAGNOSIS — K580 Irritable bowel syndrome with diarrhea: Secondary | ICD-10-CM | POA: Diagnosis present

## 2015-06-27 DIAGNOSIS — I10 Essential (primary) hypertension: Secondary | ICD-10-CM | POA: Diagnosis not present

## 2015-06-27 DIAGNOSIS — J9611 Chronic respiratory failure with hypoxia: Secondary | ICD-10-CM | POA: Diagnosis not present

## 2015-07-07 DIAGNOSIS — Z87891 Personal history of nicotine dependence: Secondary | ICD-10-CM | POA: Diagnosis not present

## 2015-07-07 DIAGNOSIS — Z79899 Other long term (current) drug therapy: Secondary | ICD-10-CM | POA: Diagnosis not present

## 2015-07-07 DIAGNOSIS — R0602 Shortness of breath: Secondary | ICD-10-CM | POA: Diagnosis not present

## 2015-07-07 DIAGNOSIS — R05 Cough: Secondary | ICD-10-CM | POA: Diagnosis not present

## 2015-07-07 DIAGNOSIS — Z9981 Dependence on supplemental oxygen: Secondary | ICD-10-CM | POA: Diagnosis not present

## 2015-07-07 DIAGNOSIS — J9611 Chronic respiratory failure with hypoxia: Secondary | ICD-10-CM | POA: Diagnosis not present

## 2015-07-07 DIAGNOSIS — E1165 Type 2 diabetes mellitus with hyperglycemia: Secondary | ICD-10-CM | POA: Diagnosis not present

## 2015-07-07 DIAGNOSIS — I10 Essential (primary) hypertension: Secondary | ICD-10-CM | POA: Diagnosis not present

## 2015-07-07 DIAGNOSIS — Z7982 Long term (current) use of aspirin: Secondary | ICD-10-CM | POA: Diagnosis not present

## 2015-07-07 DIAGNOSIS — R0781 Pleurodynia: Secondary | ICD-10-CM | POA: Diagnosis not present

## 2015-07-07 DIAGNOSIS — E119 Type 2 diabetes mellitus without complications: Secondary | ICD-10-CM | POA: Diagnosis not present

## 2015-07-07 DIAGNOSIS — J441 Chronic obstructive pulmonary disease with (acute) exacerbation: Secondary | ICD-10-CM | POA: Diagnosis not present

## 2015-07-07 DIAGNOSIS — Z7984 Long term (current) use of oral hypoglycemic drugs: Secondary | ICD-10-CM | POA: Diagnosis not present

## 2015-07-07 DIAGNOSIS — F419 Anxiety disorder, unspecified: Secondary | ICD-10-CM | POA: Diagnosis not present

## 2015-07-08 DIAGNOSIS — J9611 Chronic respiratory failure with hypoxia: Secondary | ICD-10-CM | POA: Diagnosis not present

## 2015-07-08 DIAGNOSIS — E119 Type 2 diabetes mellitus without complications: Secondary | ICD-10-CM | POA: Diagnosis not present

## 2015-07-08 DIAGNOSIS — I10 Essential (primary) hypertension: Secondary | ICD-10-CM | POA: Diagnosis not present

## 2015-07-08 DIAGNOSIS — J441 Chronic obstructive pulmonary disease with (acute) exacerbation: Secondary | ICD-10-CM | POA: Diagnosis not present

## 2015-07-09 DIAGNOSIS — J441 Chronic obstructive pulmonary disease with (acute) exacerbation: Secondary | ICD-10-CM | POA: Diagnosis not present

## 2015-07-11 ENCOUNTER — Other Ambulatory Visit: Payer: Self-pay | Admitting: Family Medicine

## 2015-07-21 ENCOUNTER — Ambulatory Visit: Payer: Medicare Other | Admitting: Family Medicine

## 2015-07-21 DIAGNOSIS — R1110 Vomiting, unspecified: Secondary | ICD-10-CM | POA: Diagnosis not present

## 2015-07-21 DIAGNOSIS — R112 Nausea with vomiting, unspecified: Secondary | ICD-10-CM | POA: Diagnosis not present

## 2015-07-21 DIAGNOSIS — Z79899 Other long term (current) drug therapy: Secondary | ICD-10-CM | POA: Diagnosis not present

## 2015-07-21 DIAGNOSIS — Z7982 Long term (current) use of aspirin: Secondary | ICD-10-CM | POA: Diagnosis not present

## 2015-07-21 DIAGNOSIS — Z7984 Long term (current) use of oral hypoglycemic drugs: Secondary | ICD-10-CM | POA: Diagnosis not present

## 2015-07-21 DIAGNOSIS — F419 Anxiety disorder, unspecified: Secondary | ICD-10-CM | POA: Diagnosis not present

## 2015-07-21 DIAGNOSIS — R05 Cough: Secondary | ICD-10-CM | POA: Diagnosis not present

## 2015-07-21 DIAGNOSIS — J449 Chronic obstructive pulmonary disease, unspecified: Secondary | ICD-10-CM | POA: Diagnosis not present

## 2015-07-21 DIAGNOSIS — I10 Essential (primary) hypertension: Secondary | ICD-10-CM | POA: Diagnosis not present

## 2015-07-21 DIAGNOSIS — R51 Headache: Secondary | ICD-10-CM | POA: Diagnosis not present

## 2015-07-21 DIAGNOSIS — R197 Diarrhea, unspecified: Secondary | ICD-10-CM | POA: Diagnosis not present

## 2015-07-21 DIAGNOSIS — E785 Hyperlipidemia, unspecified: Secondary | ICD-10-CM | POA: Diagnosis not present

## 2015-07-21 DIAGNOSIS — R0602 Shortness of breath: Secondary | ICD-10-CM | POA: Diagnosis not present

## 2015-07-21 DIAGNOSIS — Z87891 Personal history of nicotine dependence: Secondary | ICD-10-CM | POA: Diagnosis not present

## 2015-07-22 ENCOUNTER — Other Ambulatory Visit: Payer: Self-pay | Admitting: Family Medicine

## 2015-07-29 ENCOUNTER — Ambulatory Visit: Payer: Medicare Other | Admitting: Family Medicine

## 2015-08-04 ENCOUNTER — Ambulatory Visit (INDEPENDENT_AMBULATORY_CARE_PROVIDER_SITE_OTHER): Payer: Medicare Other | Admitting: Family Medicine

## 2015-08-04 ENCOUNTER — Encounter: Payer: Self-pay | Admitting: Family Medicine

## 2015-08-04 DIAGNOSIS — R1110 Vomiting, unspecified: Secondary | ICD-10-CM

## 2015-08-04 DIAGNOSIS — R197 Diarrhea, unspecified: Secondary | ICD-10-CM | POA: Diagnosis not present

## 2015-08-04 DIAGNOSIS — J441 Chronic obstructive pulmonary disease with (acute) exacerbation: Secondary | ICD-10-CM | POA: Diagnosis not present

## 2015-08-04 DIAGNOSIS — B356 Tinea cruris: Secondary | ICD-10-CM | POA: Diagnosis not present

## 2015-08-04 MED ORDER — IPRATROPIUM-ALBUTEROL 0.5-2.5 (3) MG/3ML IN SOLN
3.0000 mL | Freq: Once | RESPIRATORY_TRACT | Status: AC
  Administered 2015-08-04: 3 mL via RESPIRATORY_TRACT

## 2015-08-04 MED ORDER — METHYLPREDNISOLONE ACETATE 40 MG/ML IJ SUSP
40.0000 mg | Freq: Once | INTRAMUSCULAR | Status: AC
  Administered 2015-08-04: 40 mg via INTRAMUSCULAR

## 2015-08-04 MED ORDER — DOXYCYCLINE HYCLATE 100 MG PO TABS: 100.0000 mg | ORAL_TABLET | Freq: Two times a day (BID) | ORAL | Status: DC

## 2015-08-04 MED ORDER — METHYLPREDNISOLONE SODIUM SUCC 125 MG IJ SOLR
125.0000 mg | Freq: Once | INTRAMUSCULAR | Status: AC
  Administered 2015-08-04: 125 mg via INTRAMUSCULAR

## 2015-08-04 NOTE — Addendum Note (Signed)
Addended by: Elizabeth Sauer on: 08/04/2015 05:02 PM   Modules accepted: Orders

## 2015-08-04 NOTE — Progress Notes (Signed)
   Subjective:    Patient ID: Jared Lee, male    DOB: 1941/07/29, 74 y.o.   MRN: [id number]  HPI 74 yo male with a history of oxygen dependent COPD was seen in the emergency department at [facility] on November 21. He came in with nausea vomiting and diarrhea as well as a COPD exacerbation. He was given a nebulizer treatment as well as a course of prednisone. With this shift in whether he's felt more short of breath. He says he has a productive cough with brown-colored sputum. He is doing his nebulizer treatments 3-4 times per day. He does wear oxygen but removes it during the office visit. He said he did have vomiting and diarrhea for 3 days but that has resolved and he is actually feeling much better from that perspective.  He does have a rash on the posterior buttocks crease area. He does wear adult diapers. His son-in-law he was here with him today says he encourages him to change them frequently that he often does not. They were given a cream in the hospital which they've been applying almost every other day. The son-in-law says that he doesn't apply it on days where Jared Lee doesn't want it applied. It does seem to be healing and does not cause any pain or discomfort.   Review of Systems     Objective:   Physical Exam  Constitutional: He is oriented to person, place, and time. He appears well-developed and well-nourished.  Not well kempt  HENT:  Head: Normocephalic and atraumatic.  Right Ear: External ear normal.  Left Ear: External ear normal.  Nose: Nose normal.  Mouth/Throat: Oropharynx is clear and moist.  TMs and canals are clear.   Eyes: Conjunctivae and EOM are normal. Pupils are equal, round, and reactive to light.  Neck: Neck supple. No thyromegaly present.  Cardiovascular: Normal rate and normal heart sounds.   Pulmonary/Chest: Effort normal and breath sounds normal.  Prolonged expiration.   Lymphadenopathy:    He has no cervical adenopathy.  Neurological: He is  alert and oriented to person, place, and time.  Skin: Skin is warm and dry.  Psychiatric: He has a normal mood and affect.          Assessment & Plan:  COPD exacerbation-will treat with Solu-Medrol IM and Depo-Medrol. He does respond well to this and does not do well with oral prednisone. Given nebulizer treatment here in the office. We'll send  a prescription for doxycycline. Oxygen level came up nicely after the albuterol treatment.  Diarrhea/vomiting-resolved. Likely gastroenteritis.  Tinea cruris most likely-continue using cream every other day until symptoms completely resolved. Certainly if he starts to notice any breakdown of the skin then please let us know.

## 2015-08-15 ENCOUNTER — Telehealth: Payer: Self-pay

## 2015-08-15 ENCOUNTER — Other Ambulatory Visit: Payer: Self-pay

## 2015-08-15 MED ORDER — ALBUTEROL SULFATE (2.5 MG/3ML) 0.083% IN NEBU
2.5000 mg | INHALATION_SOLUTION | RESPIRATORY_TRACT | Status: DC | PRN
Start: 2015-08-15 — End: 2015-11-04

## 2015-08-15 MED ORDER — VENTOLIN HFA 108 (90 BASE) MCG/ACT IN AERS: 2.0000 | INHALATION_SPRAY | Freq: Four times a day (QID) | RESPIRATORY_TRACT | Status: DC | PRN

## 2015-08-15 MED ORDER — ALBUTEROL SULFATE (2.5 MG/3ML) 0.083% IN NEBU: 2.5000 mg | INHALATION_SOLUTION | RESPIRATORY_TRACT | Status: DC | PRN

## 2015-08-15 NOTE — Telephone Encounter (Signed)
Sent new prescription in to the pharmacy.

## 2015-08-24 DIAGNOSIS — R05 Cough: Secondary | ICD-10-CM | POA: Diagnosis not present

## 2015-08-24 DIAGNOSIS — R112 Nausea with vomiting, unspecified: Secondary | ICD-10-CM | POA: Diagnosis not present

## 2015-08-24 DIAGNOSIS — Z79899 Other long term (current) drug therapy: Secondary | ICD-10-CM | POA: Diagnosis not present

## 2015-08-24 DIAGNOSIS — I10 Essential (primary) hypertension: Secondary | ICD-10-CM | POA: Diagnosis not present

## 2015-08-24 DIAGNOSIS — J441 Chronic obstructive pulmonary disease with (acute) exacerbation: Secondary | ICD-10-CM | POA: Diagnosis not present

## 2015-08-24 DIAGNOSIS — J9611 Chronic respiratory failure with hypoxia: Secondary | ICD-10-CM | POA: Diagnosis not present

## 2015-08-24 DIAGNOSIS — F419 Anxiety disorder, unspecified: Secondary | ICD-10-CM | POA: Diagnosis not present

## 2015-08-24 DIAGNOSIS — Z9981 Dependence on supplemental oxygen: Secondary | ICD-10-CM | POA: Diagnosis not present

## 2015-08-24 DIAGNOSIS — R197 Diarrhea, unspecified: Secondary | ICD-10-CM | POA: Insufficient documentation

## 2015-08-24 DIAGNOSIS — Z7982 Long term (current) use of aspirin: Secondary | ICD-10-CM | POA: Diagnosis not present

## 2015-08-24 DIAGNOSIS — K529 Noninfective gastroenteritis and colitis, unspecified: Secondary | ICD-10-CM | POA: Diagnosis not present

## 2015-08-24 DIAGNOSIS — Z87891 Personal history of nicotine dependence: Secondary | ICD-10-CM | POA: Diagnosis not present

## 2015-08-24 DIAGNOSIS — D72829 Elevated white blood cell count, unspecified: Secondary | ICD-10-CM | POA: Diagnosis not present

## 2015-08-24 DIAGNOSIS — Z7951 Long term (current) use of inhaled steroids: Secondary | ICD-10-CM | POA: Diagnosis not present

## 2015-08-24 DIAGNOSIS — H9190 Unspecified hearing loss, unspecified ear: Secondary | ICD-10-CM | POA: Diagnosis not present

## 2015-08-24 DIAGNOSIS — R0789 Other chest pain: Secondary | ICD-10-CM | POA: Diagnosis not present

## 2015-08-24 DIAGNOSIS — E785 Hyperlipidemia, unspecified: Secondary | ICD-10-CM | POA: Diagnosis not present

## 2015-08-24 DIAGNOSIS — E860 Dehydration: Secondary | ICD-10-CM | POA: Diagnosis not present

## 2015-08-24 DIAGNOSIS — E119 Type 2 diabetes mellitus without complications: Secondary | ICD-10-CM | POA: Diagnosis not present

## 2015-08-24 DIAGNOSIS — J9610 Chronic respiratory failure, unspecified whether with hypoxia or hypercapnia: Secondary | ICD-10-CM | POA: Diagnosis not present

## 2015-08-24 DIAGNOSIS — Z7984 Long term (current) use of oral hypoglycemic drugs: Secondary | ICD-10-CM | POA: Diagnosis not present

## 2015-08-24 DIAGNOSIS — R0602 Shortness of breath: Secondary | ICD-10-CM | POA: Diagnosis not present

## 2015-08-24 DIAGNOSIS — Z7952 Long term (current) use of systemic steroids: Secondary | ICD-10-CM | POA: Diagnosis not present

## 2015-08-25 DIAGNOSIS — R0902 Hypoxemia: Secondary | ICD-10-CM | POA: Diagnosis not present

## 2015-08-25 DIAGNOSIS — R0689 Other abnormalities of breathing: Secondary | ICD-10-CM | POA: Diagnosis not present

## 2015-08-25 DIAGNOSIS — J441 Chronic obstructive pulmonary disease with (acute) exacerbation: Secondary | ICD-10-CM | POA: Diagnosis not present

## 2015-08-25 DIAGNOSIS — K529 Noninfective gastroenteritis and colitis, unspecified: Secondary | ICD-10-CM | POA: Diagnosis not present

## 2015-08-26 DIAGNOSIS — J441 Chronic obstructive pulmonary disease with (acute) exacerbation: Secondary | ICD-10-CM | POA: Diagnosis not present

## 2015-08-26 DIAGNOSIS — J9621 Acute and chronic respiratory failure with hypoxia: Secondary | ICD-10-CM | POA: Diagnosis not present

## 2015-08-26 DIAGNOSIS — I10 Essential (primary) hypertension: Secondary | ICD-10-CM | POA: Diagnosis not present

## 2015-08-26 DIAGNOSIS — K529 Noninfective gastroenteritis and colitis, unspecified: Secondary | ICD-10-CM | POA: Diagnosis not present

## 2015-08-27 DIAGNOSIS — R0602 Shortness of breath: Secondary | ICD-10-CM | POA: Diagnosis not present

## 2015-08-27 DIAGNOSIS — J9601 Acute respiratory failure with hypoxia: Secondary | ICD-10-CM | POA: Diagnosis not present

## 2015-08-27 DIAGNOSIS — I10 Essential (primary) hypertension: Secondary | ICD-10-CM | POA: Diagnosis not present

## 2015-08-27 DIAGNOSIS — E119 Type 2 diabetes mellitus without complications: Secondary | ICD-10-CM | POA: Diagnosis not present

## 2015-08-28 DIAGNOSIS — I10 Essential (primary) hypertension: Secondary | ICD-10-CM | POA: Diagnosis present

## 2015-08-28 DIAGNOSIS — R0602 Shortness of breath: Secondary | ICD-10-CM | POA: Diagnosis not present

## 2015-08-28 DIAGNOSIS — J9611 Chronic respiratory failure with hypoxia: Secondary | ICD-10-CM | POA: Diagnosis present

## 2015-08-28 DIAGNOSIS — Z7984 Long term (current) use of oral hypoglycemic drugs: Secondary | ICD-10-CM | POA: Diagnosis not present

## 2015-08-28 DIAGNOSIS — E785 Hyperlipidemia, unspecified: Secondary | ICD-10-CM | POA: Diagnosis present

## 2015-08-28 DIAGNOSIS — H9190 Unspecified hearing loss, unspecified ear: Secondary | ICD-10-CM | POA: Diagnosis present

## 2015-08-28 DIAGNOSIS — E860 Dehydration: Secondary | ICD-10-CM | POA: Diagnosis present

## 2015-08-28 DIAGNOSIS — Z7982 Long term (current) use of aspirin: Secondary | ICD-10-CM | POA: Diagnosis not present

## 2015-08-28 DIAGNOSIS — Z79899 Other long term (current) drug therapy: Secondary | ICD-10-CM | POA: Diagnosis not present

## 2015-08-28 DIAGNOSIS — Z7951 Long term (current) use of inhaled steroids: Secondary | ICD-10-CM | POA: Diagnosis not present

## 2015-08-28 DIAGNOSIS — Z7952 Long term (current) use of systemic steroids: Secondary | ICD-10-CM | POA: Diagnosis not present

## 2015-08-28 DIAGNOSIS — K529 Noninfective gastroenteritis and colitis, unspecified: Secondary | ICD-10-CM | POA: Diagnosis present

## 2015-08-28 DIAGNOSIS — Z87891 Personal history of nicotine dependence: Secondary | ICD-10-CM | POA: Diagnosis not present

## 2015-08-28 DIAGNOSIS — D72829 Elevated white blood cell count, unspecified: Secondary | ICD-10-CM | POA: Diagnosis not present

## 2015-08-28 DIAGNOSIS — Z9981 Dependence on supplemental oxygen: Secondary | ICD-10-CM | POA: Diagnosis not present

## 2015-08-28 DIAGNOSIS — E119 Type 2 diabetes mellitus without complications: Secondary | ICD-10-CM | POA: Diagnosis present

## 2015-08-28 DIAGNOSIS — F419 Anxiety disorder, unspecified: Secondary | ICD-10-CM | POA: Diagnosis present

## 2015-08-28 DIAGNOSIS — J441 Chronic obstructive pulmonary disease with (acute) exacerbation: Secondary | ICD-10-CM | POA: Diagnosis present

## 2015-08-30 DIAGNOSIS — J441 Chronic obstructive pulmonary disease with (acute) exacerbation: Secondary | ICD-10-CM | POA: Diagnosis not present

## 2015-08-30 DIAGNOSIS — I110 Hypertensive heart disease with heart failure: Secondary | ICD-10-CM | POA: Diagnosis not present

## 2015-09-02 DIAGNOSIS — I110 Hypertensive heart disease with heart failure: Secondary | ICD-10-CM | POA: Diagnosis not present

## 2015-09-02 DIAGNOSIS — J441 Chronic obstructive pulmonary disease with (acute) exacerbation: Secondary | ICD-10-CM | POA: Diagnosis not present

## 2015-09-04 DIAGNOSIS — I110 Hypertensive heart disease with heart failure: Secondary | ICD-10-CM | POA: Diagnosis not present

## 2015-09-04 DIAGNOSIS — J441 Chronic obstructive pulmonary disease with (acute) exacerbation: Secondary | ICD-10-CM | POA: Diagnosis not present

## 2015-09-05 ENCOUNTER — Inpatient Hospital Stay: Payer: Medicare Other | Admitting: Family Medicine

## 2015-09-09 DIAGNOSIS — J441 Chronic obstructive pulmonary disease with (acute) exacerbation: Secondary | ICD-10-CM | POA: Diagnosis not present

## 2015-09-09 DIAGNOSIS — I110 Hypertensive heart disease with heart failure: Secondary | ICD-10-CM | POA: Diagnosis not present

## 2015-09-16 DIAGNOSIS — J441 Chronic obstructive pulmonary disease with (acute) exacerbation: Secondary | ICD-10-CM | POA: Diagnosis not present

## 2015-09-16 DIAGNOSIS — I110 Hypertensive heart disease with heart failure: Secondary | ICD-10-CM | POA: Diagnosis not present

## 2015-09-26 DIAGNOSIS — Z7952 Long term (current) use of systemic steroids: Secondary | ICD-10-CM | POA: Diagnosis not present

## 2015-09-26 DIAGNOSIS — J441 Chronic obstructive pulmonary disease with (acute) exacerbation: Secondary | ICD-10-CM | POA: Diagnosis not present

## 2015-09-26 DIAGNOSIS — J209 Acute bronchitis, unspecified: Secondary | ICD-10-CM | POA: Diagnosis not present

## 2015-09-26 DIAGNOSIS — J40 Bronchitis, not specified as acute or chronic: Secondary | ICD-10-CM | POA: Insufficient documentation

## 2015-09-26 DIAGNOSIS — J440 Chronic obstructive pulmonary disease with acute lower respiratory infection: Secondary | ICD-10-CM | POA: Diagnosis not present

## 2015-09-26 DIAGNOSIS — E119 Type 2 diabetes mellitus without complications: Secondary | ICD-10-CM | POA: Diagnosis not present

## 2015-09-26 DIAGNOSIS — I10 Essential (primary) hypertension: Secondary | ICD-10-CM | POA: Diagnosis not present

## 2015-09-26 DIAGNOSIS — Z9981 Dependence on supplemental oxygen: Secondary | ICD-10-CM | POA: Diagnosis not present

## 2015-09-26 DIAGNOSIS — H9190 Unspecified hearing loss, unspecified ear: Secondary | ICD-10-CM | POA: Diagnosis not present

## 2015-09-26 DIAGNOSIS — Z79899 Other long term (current) drug therapy: Secondary | ICD-10-CM | POA: Diagnosis not present

## 2015-09-26 DIAGNOSIS — R05 Cough: Secondary | ICD-10-CM | POA: Diagnosis not present

## 2015-09-26 DIAGNOSIS — Z87891 Personal history of nicotine dependence: Secondary | ICD-10-CM | POA: Diagnosis not present

## 2015-09-26 DIAGNOSIS — K529 Noninfective gastroenteritis and colitis, unspecified: Secondary | ICD-10-CM | POA: Diagnosis not present

## 2015-09-26 DIAGNOSIS — Z7984 Long term (current) use of oral hypoglycemic drugs: Secondary | ICD-10-CM | POA: Diagnosis not present

## 2015-09-26 DIAGNOSIS — Z7982 Long term (current) use of aspirin: Secondary | ICD-10-CM | POA: Diagnosis not present

## 2015-09-26 DIAGNOSIS — R0602 Shortness of breath: Secondary | ICD-10-CM | POA: Diagnosis not present

## 2015-09-26 DIAGNOSIS — Z7951 Long term (current) use of inhaled steroids: Secondary | ICD-10-CM | POA: Diagnosis not present

## 2015-09-26 DIAGNOSIS — F341 Dysthymic disorder: Secondary | ICD-10-CM | POA: Diagnosis not present

## 2015-09-26 DIAGNOSIS — E785 Hyperlipidemia, unspecified: Secondary | ICD-10-CM | POA: Diagnosis not present

## 2015-09-27 DIAGNOSIS — J441 Chronic obstructive pulmonary disease with (acute) exacerbation: Secondary | ICD-10-CM | POA: Diagnosis not present

## 2015-09-28 DIAGNOSIS — J441 Chronic obstructive pulmonary disease with (acute) exacerbation: Secondary | ICD-10-CM | POA: Diagnosis not present

## 2015-09-29 DIAGNOSIS — I119 Hypertensive heart disease without heart failure: Secondary | ICD-10-CM | POA: Diagnosis not present

## 2015-09-29 DIAGNOSIS — J441 Chronic obstructive pulmonary disease with (acute) exacerbation: Secondary | ICD-10-CM | POA: Diagnosis not present

## 2015-09-29 DIAGNOSIS — I10 Essential (primary) hypertension: Secondary | ICD-10-CM | POA: Diagnosis not present

## 2015-09-29 DIAGNOSIS — F341 Dysthymic disorder: Secondary | ICD-10-CM | POA: Diagnosis not present

## 2015-09-30 ENCOUNTER — Other Ambulatory Visit: Payer: Self-pay | Admitting: Family Medicine

## 2015-09-30 DIAGNOSIS — I110 Hypertensive heart disease with heart failure: Secondary | ICD-10-CM | POA: Diagnosis not present

## 2015-09-30 DIAGNOSIS — J441 Chronic obstructive pulmonary disease with (acute) exacerbation: Secondary | ICD-10-CM | POA: Diagnosis not present

## 2015-10-01 DIAGNOSIS — J441 Chronic obstructive pulmonary disease with (acute) exacerbation: Secondary | ICD-10-CM | POA: Diagnosis not present

## 2015-10-01 DIAGNOSIS — I110 Hypertensive heart disease with heart failure: Secondary | ICD-10-CM | POA: Diagnosis not present

## 2015-10-02 DIAGNOSIS — J441 Chronic obstructive pulmonary disease with (acute) exacerbation: Secondary | ICD-10-CM | POA: Diagnosis not present

## 2015-10-02 DIAGNOSIS — I110 Hypertensive heart disease with heart failure: Secondary | ICD-10-CM | POA: Diagnosis not present

## 2015-10-06 ENCOUNTER — Other Ambulatory Visit: Payer: Self-pay

## 2015-10-06 ENCOUNTER — Ambulatory Visit (INDEPENDENT_AMBULATORY_CARE_PROVIDER_SITE_OTHER): Payer: Medicare Other | Admitting: Family Medicine

## 2015-10-06 ENCOUNTER — Encounter: Payer: Self-pay | Admitting: Family Medicine

## 2015-10-06 VITALS — BP 128/76 | HR 48 | Wt 146.0 lb

## 2015-10-06 DIAGNOSIS — E099 Drug or chemical induced diabetes mellitus without complications: Secondary | ICD-10-CM | POA: Diagnosis not present

## 2015-10-06 DIAGNOSIS — J441 Chronic obstructive pulmonary disease with (acute) exacerbation: Secondary | ICD-10-CM

## 2015-10-06 DIAGNOSIS — J019 Acute sinusitis, unspecified: Secondary | ICD-10-CM | POA: Diagnosis not present

## 2015-10-06 DIAGNOSIS — I10 Essential (primary) hypertension: Secondary | ICD-10-CM

## 2015-10-06 DIAGNOSIS — J209 Acute bronchitis, unspecified: Secondary | ICD-10-CM

## 2015-10-06 LAB — POCT GLYCOSYLATED HEMOGLOBIN (HGB A1C): HEMOGLOBIN A1C: 6.5

## 2015-10-06 MED ORDER — METHYLPREDNISOLONE SODIUM SUCC 125 MG IJ SOLR
125.0000 mg | Freq: Once | INTRAMUSCULAR | Status: AC
  Administered 2015-10-06: 125 mg via INTRAMUSCULAR

## 2015-10-06 MED ORDER — AZITHROMYCIN 250 MG PO TABS
ORAL_TABLET | ORAL | Status: AC
Start: 2015-10-06 — End: 2015-10-11

## 2015-10-06 MED ORDER — ALBUTEROL SULFATE (2.5 MG/3ML) 0.083% IN NEBU
2.5000 mg | INHALATION_SOLUTION | Freq: Once | RESPIRATORY_TRACT | Status: AC
  Administered 2015-10-06: 2.5 mg via RESPIRATORY_TRACT

## 2015-10-06 MED ORDER — METHYLPREDNISOLONE ACETATE 40 MG/ML IJ SUSP
40.0000 mg | Freq: Once | INTRAMUSCULAR | Status: AC
  Administered 2015-10-06: 40 mg via INTRAMUSCULAR

## 2015-10-06 NOTE — Patient Outreach (Signed)
[facility]) Care Management  10/06/2015  Jared Lee [birth year] [id number]  Telephone call to patient and [name], patients emergency contact. Unable to reach patient and/or [facility]. HIPAA compliant voice message left with call back phone number.   PLAN: RNCM will attempt 2nd telephone outreach to patient within 1 week.   Quinn Plowman RN,BSN,CCM [facility] Telephonic  [phone]

## 2015-10-06 NOTE — Progress Notes (Signed)
   Subjective:    Patient ID: Jared Lee, male    DOB: 03-28-1941, 75 y.o.   MRN: [id number]  HPI Hypertension- Pt denies chest pain, SOB, dizziness, or heart palpitations.  Taking meds as directed w/o problems.  Denies medication side effects.    Diabetes - no hypoglycemic events. No wounds or sores that are not healing well. No increased thirst or urination. Checking glucose at home. Taking medications as prescribed without any side effects. He does need a refill sent to the pharmacy.  COPD  - he is on Symbicort.   He wears O2 daily.  He c/o of 4 days of sinus congestion with inc cough and sputum production. Inc SOB and wheezing.  He has been using his albuterol regularly.  Says had a low grade fever the first few days.  He was actually just recently hospitalized for 3 days and discharged home on January 30 for COPD exacerbation. He had virtually has had multiple admissions. He was treated with a course of doxycycline.  Review of Systems     Objective:   Physical Exam  Constitutional: He is oriented to person, place, and time. He appears well-developed and well-nourished.  HENT:  Head: Normocephalic and atraumatic.  Right Ear: External ear normal.  Left Ear: External ear normal.  Nose: Nose normal.  Mouth/Throat: Oropharynx is clear and moist.  TMs and canals are clear.   Eyes: Conjunctivae and EOM are normal. Pupils are equal, round, and reactive to light.  Neck: Neck supple. No thyromegaly present.  Cardiovascular: Normal rate, regular rhythm and normal heart sounds.   Pulmonary/Chest: Effort normal. He has wheezes. He exhibits no tenderness.  Diffuse rhonchi and prolonged expiration. Slight expiratory wheezing bilaterally.  Musculoskeletal: He exhibits no edema.  Lymphadenopathy:    He has no cervical adenopathy.  Neurological: He is alert and oriented to person, place, and time.  Skin: Skin is warm and dry.  Psychiatric: He has a normal mood and affect. His behavior is normal.           Assessment & Plan:  HTN - well controlled. Continue current regimen. It's not clear to me which lisinopril he is taking. I left based on his list and encouraged him to let me know.  DM- well controlled on glipizide 2.5 XL daily.  A1C of 6.5 today.  Continue current regimen.  Reminded due for eye exa.    COPD exacerbation with URI sxs.  Will treat with azithromycin and give him Solu-Medrol and Depo-Medrol IM here in the office. He does have increased work of breathing here in the office and is wearing his oxygen. Continue use albuterol liberally every 4-6 hours as needed for the next few days until he is feeling better. Prescription sent to the pharmacy. He does have a diagnosis of congestive heart failure but no signs of volume overload on exam today.

## 2015-10-07 ENCOUNTER — Encounter: Payer: Self-pay | Admitting: Family Medicine

## 2015-10-07 DIAGNOSIS — J441 Chronic obstructive pulmonary disease with (acute) exacerbation: Secondary | ICD-10-CM | POA: Diagnosis not present

## 2015-10-07 DIAGNOSIS — I110 Hypertensive heart disease with heart failure: Secondary | ICD-10-CM | POA: Diagnosis not present

## 2015-10-08 ENCOUNTER — Other Ambulatory Visit: Payer: Self-pay

## 2015-10-08 DIAGNOSIS — I110 Hypertensive heart disease with heart failure: Secondary | ICD-10-CM | POA: Diagnosis not present

## 2015-10-08 DIAGNOSIS — J441 Chronic obstructive pulmonary disease with (acute) exacerbation: Secondary | ICD-10-CM | POA: Diagnosis not present

## 2015-10-08 NOTE — Patient Outreach (Signed)
[facility]) Care Management  10/08/2015  Jared Lee 1941-03-12 [id number]   SUBJECTIVE: Telephone call to patient regarding NEXT GEN high risk referral.  Patient states he was able to hear RNCM at time of call. HIPAA verified with patient.  Discussed and offered [facility] care management services to patient.  Patient states he was told that he would qualify for meals on wheels and transportation. Patient gave verbal authorization to speak with [name], caregiver. Patient states [name] is not there at this time.  Patient states he will have [name] return RNCM's call to discuss [facility] services further before he makes a decision. Patient states he has difficulty with his hearing.   PLAN;  RNCM will await return call from [facility] as requested by patient.  RNCM will attempt 2nd follow up call with patient within 1 week if no return call from [facility].   Quinn Plowman RN,BSN,CCM [facility] Telephonic  [phone]

## 2015-10-09 ENCOUNTER — Ambulatory Visit: Payer: Self-pay

## 2015-10-09 ENCOUNTER — Other Ambulatory Visit: Payer: Self-pay | Admitting: Family Medicine

## 2015-10-10 ENCOUNTER — Ambulatory Visit: Payer: Self-pay

## 2015-10-13 ENCOUNTER — Other Ambulatory Visit: Payer: Self-pay

## 2015-10-13 NOTE — Patient Outreach (Signed)
[facility]) Care Management  10/13/2015  KAYNE COULTAS May 03, 1941 [id number]  SUBJECTIVE: Telephone call to patient regarding [employer]Gen high risk referral. HIPAA verified with patient.  Patient states Mr. Myrle Sheng is not at home.   Patient request call back to [facility] due to being hard of hearing.   PLAN:  RNCM will send patient outreach letter to attempt outreach.   Quinn Plowman RN,BSN,CCM [facility] Telephonic  [phone]

## 2015-10-14 DIAGNOSIS — I110 Hypertensive heart disease with heart failure: Secondary | ICD-10-CM | POA: Diagnosis not present

## 2015-10-14 DIAGNOSIS — J441 Chronic obstructive pulmonary disease with (acute) exacerbation: Secondary | ICD-10-CM | POA: Diagnosis not present

## 2015-10-15 DIAGNOSIS — I110 Hypertensive heart disease with heart failure: Secondary | ICD-10-CM | POA: Diagnosis not present

## 2015-10-15 DIAGNOSIS — J441 Chronic obstructive pulmonary disease with (acute) exacerbation: Secondary | ICD-10-CM | POA: Diagnosis not present

## 2015-10-21 ENCOUNTER — Other Ambulatory Visit: Payer: Self-pay | Admitting: Family Medicine

## 2015-10-21 DIAGNOSIS — J441 Chronic obstructive pulmonary disease with (acute) exacerbation: Secondary | ICD-10-CM | POA: Diagnosis not present

## 2015-10-21 DIAGNOSIS — I110 Hypertensive heart disease with heart failure: Secondary | ICD-10-CM | POA: Diagnosis not present

## 2015-10-27 ENCOUNTER — Other Ambulatory Visit: Payer: Self-pay | Admitting: Family Medicine

## 2015-10-28 ENCOUNTER — Telehealth: Payer: Self-pay | Admitting: Family Medicine

## 2015-10-28 DIAGNOSIS — Z79899 Other long term (current) drug therapy: Secondary | ICD-10-CM | POA: Diagnosis not present

## 2015-10-28 DIAGNOSIS — Z7982 Long term (current) use of aspirin: Secondary | ICD-10-CM | POA: Diagnosis not present

## 2015-10-28 DIAGNOSIS — I110 Hypertensive heart disease with heart failure: Secondary | ICD-10-CM | POA: Diagnosis not present

## 2015-10-28 DIAGNOSIS — R069 Unspecified abnormalities of breathing: Secondary | ICD-10-CM | POA: Diagnosis not present

## 2015-10-28 DIAGNOSIS — J441 Chronic obstructive pulmonary disease with (acute) exacerbation: Secondary | ICD-10-CM | POA: Diagnosis not present

## 2015-10-28 DIAGNOSIS — I10 Essential (primary) hypertension: Secondary | ICD-10-CM | POA: Diagnosis not present

## 2015-10-28 DIAGNOSIS — R05 Cough: Secondary | ICD-10-CM | POA: Diagnosis not present

## 2015-10-28 DIAGNOSIS — E119 Type 2 diabetes mellitus without complications: Secondary | ICD-10-CM | POA: Diagnosis not present

## 2015-10-28 DIAGNOSIS — E785 Hyperlipidemia, unspecified: Secondary | ICD-10-CM | POA: Diagnosis not present

## 2015-10-28 DIAGNOSIS — R0602 Shortness of breath: Secondary | ICD-10-CM | POA: Diagnosis not present

## 2015-10-28 DIAGNOSIS — Z7984 Long term (current) use of oral hypoglycemic drugs: Secondary | ICD-10-CM | POA: Diagnosis not present

## 2015-10-28 DIAGNOSIS — Z87891 Personal history of nicotine dependence: Secondary | ICD-10-CM | POA: Diagnosis not present

## 2015-10-28 DIAGNOSIS — J40 Bronchitis, not specified as acute or chronic: Secondary | ICD-10-CM | POA: Diagnosis not present

## 2015-10-28 NOTE — Telephone Encounter (Signed)
Nira Conn, RN from [place] called to say she say Pt for his final home treatment today. When she got there Pt was stating he wanted to go to the ED due to shortness of breath and trouble breathing. Nira Conn tried to get Pt to agree for her to call us and get him an appointment, Pt declined stating "they can't help me when I can't breathe." Pt was taken via ambulance to the ED.

## 2015-10-29 DIAGNOSIS — J441 Chronic obstructive pulmonary disease with (acute) exacerbation: Secondary | ICD-10-CM | POA: Diagnosis not present

## 2015-10-29 DIAGNOSIS — I10 Essential (primary) hypertension: Secondary | ICD-10-CM | POA: Diagnosis not present

## 2015-10-29 DIAGNOSIS — E119 Type 2 diabetes mellitus without complications: Secondary | ICD-10-CM | POA: Diagnosis not present

## 2015-10-29 DIAGNOSIS — J40 Bronchitis, not specified as acute or chronic: Secondary | ICD-10-CM | POA: Diagnosis not present

## 2015-10-30 DIAGNOSIS — E118 Type 2 diabetes mellitus with unspecified complications: Secondary | ICD-10-CM | POA: Diagnosis not present

## 2015-10-30 DIAGNOSIS — J441 Chronic obstructive pulmonary disease with (acute) exacerbation: Secondary | ICD-10-CM | POA: Diagnosis not present

## 2015-10-30 DIAGNOSIS — I10 Essential (primary) hypertension: Secondary | ICD-10-CM | POA: Diagnosis not present

## 2015-10-30 DIAGNOSIS — J40 Bronchitis, not specified as acute or chronic: Secondary | ICD-10-CM | POA: Diagnosis not present

## 2015-10-30 LAB — POCT GLUCOSE (2 HR PP)

## 2015-11-03 ENCOUNTER — Other Ambulatory Visit: Payer: Self-pay | Admitting: Family Medicine

## 2015-11-04 ENCOUNTER — Other Ambulatory Visit: Payer: Self-pay | Admitting: *Deleted

## 2015-11-04 MED ORDER — ALBUTEROL SULFATE (2.5 MG/3ML) 0.083% IN NEBU: 2.5000 mg | INHALATION_SOLUTION | RESPIRATORY_TRACT | Status: DC | PRN

## 2015-11-07 ENCOUNTER — Encounter: Payer: Self-pay | Admitting: Family Medicine

## 2015-11-07 ENCOUNTER — Ambulatory Visit (INDEPENDENT_AMBULATORY_CARE_PROVIDER_SITE_OTHER): Payer: Medicare Other | Admitting: Family Medicine

## 2015-11-07 ENCOUNTER — Other Ambulatory Visit: Payer: Self-pay | Admitting: *Deleted

## 2015-11-07 VITALS — BP 108/61 | HR 71 | Wt 143.0 lb

## 2015-11-07 DIAGNOSIS — R0602 Shortness of breath: Secondary | ICD-10-CM | POA: Diagnosis not present

## 2015-11-07 DIAGNOSIS — I10 Essential (primary) hypertension: Secondary | ICD-10-CM

## 2015-11-07 DIAGNOSIS — I509 Heart failure, unspecified: Secondary | ICD-10-CM | POA: Diagnosis not present

## 2015-11-07 DIAGNOSIS — J441 Chronic obstructive pulmonary disease with (acute) exacerbation: Secondary | ICD-10-CM

## 2015-11-07 MED ORDER — METHYLPREDNISOLONE ACETATE 80 MG/ML IJ SUSP
80.0000 mg | Freq: Once | INTRAMUSCULAR | Status: AC
Start: 2015-11-07 — End: 2015-11-07
  Administered 2015-11-07: 80 mg via INTRAMUSCULAR

## 2015-11-07 MED ORDER — METHYLPREDNISOLONE SODIUM SUCC 125 MG IJ SOLR
125.0000 mg | Freq: Once | INTRAMUSCULAR | Status: AC
  Administered 2015-11-07: 125 mg via INTRAMUSCULAR

## 2015-11-07 MED ORDER — ALBUTEROL SULFATE (2.5 MG/3ML) 0.083% IN NEBU
2.5000 mg | INHALATION_SOLUTION | Freq: Once | RESPIRATORY_TRACT | Status: AC
  Administered 2015-11-07: 2.5 mg via RESPIRATORY_TRACT

## 2015-11-07 MED ORDER — PREDNISONE 10 MG PO TABS: 10.0000 mg | ORAL_TABLET | Freq: Every day | ORAL | Status: DC

## 2015-11-07 MED ORDER — IPRATROPIUM-ALBUTEROL 0.5-2.5 (3) MG/3ML IN SOLN
3.0000 mL | Freq: Once | RESPIRATORY_TRACT | Status: AC
  Administered 2015-11-07: 3 mL via RESPIRATORY_TRACT

## 2015-11-07 NOTE — Progress Notes (Addendum)
Subjective:    Patient ID: Jared Lee, male    DOB: 06/04/1941, 75 y.o.   MRN: [id number]  HPI 75 year old male with a history of severe COPD on 4 L of oxygen comes in today for hospital follow-up. He was admitted about 2 weeks ago were 25 with a diagnosis of acute exacerbation of COPD. Was discharged home on March 2. He was given azithromycin and a prednisone taper. He completed his antibiotic about 3-4 days ago.  COPD exacerbation - he started feeling more short of breath again about 3 days ago after completing his prednisone. He still has some chronic cough. Some occasional green and yellow phlegm.No fevers chills or sweats. Uses albuterol nebulizer 3 times a day.  DM- last hemoglobin A1c looked great at 6.5. Lab Results  Component Value Date   HGBA1C 6.5 10/06/2015   He also brought me a pill that he wanted me to identify. Said he said he only had one left but wasn't sure really what it was and that he needed refills   Review of Systems  BP 108/61 mmHg  Pulse 71  Wt 143 lb (64.864 kg)  SpO2 92%    Allergies  Allergen Reactions  . Augmentin [Amoxicillin-Pot Clavulanate] Nausea And Vomiting    Stomach pain as well.   Newton Pigg [Roflumilast] Other (See Comments)    HA, diarrhea   . Levaquin [Levofloxacin]   . Prednisone Other (See Comments)    Swelling    Past Medical History  Diagnosis Date  . Hypertension   . H/O colonoscopy 02/03/12    EGD as well. Mildy nodular gastritis.    Marland Kitchen COPD (chronic obstructive pulmonary disease) ([place])   . Normal cardiac stress test 04/14/12    High POint REgional  . Hyperlipemia     No past surgical history on file.  Social History   Social History  . Marital Status: Single    Spouse Name: N/A  . Number of Children: N/A  . Years of Education: N/A   Occupational History  . Not on file.   Social History Main Topics  . Smoking status: Former Smoker -- 1.00 packs/day for 66 years    Types: Cigarettes    Quit date: 08/31/2011   . Smokeless tobacco: Never Used  . Alcohol Use: No  . Drug Use: No  . Sexual Activity: Not on file   Other Topics Concern  . Not on file   Social History Narrative    Family History  Problem Relation Age of Onset  . Heart disease Father   . Hypertension Father   . Emphysema Father   . Emphysema Paternal Grandfather     Outpatient Encounter Prescriptions as of 11/07/2015  Medication Sig  . albuterol (PROVENTIL) (2.5 MG/3ML) 0.083% nebulizer solution Take 3 mLs (2.5 mg total) by nebulization every 4 (four) hours as needed for wheezing or shortness of breath. ICD 10 J44.1 COPD  . ALPRAZolam (XANAX) 0.25 MG tablet TAKE ONE TABLET BY MOUTH ONCE DAILY AS NEEDED FOR ANXIETY  . AMBULATORY NON FORMULARY MEDICATION Medication Name: Ambulatory oxygen cocentrator.  Pt is mobile within th home.  Pt requests portable concentrator. Using [employer] for oxygen needs.  . AMBULATORY NON FORMULARY MEDICATION Medication Name: One Touch Ultra Test Strips for BID Testing Daily. Dx Code: 250.00  Dx: Type II or unspecified type diabetes mellitus without mention of complication, not stated as uncontrolled (250.00)  . aspirin 81 MG tablet Take 81 mg by mouth daily.  . Blood Glucose  Monitoring Suppl (BLOOD GLUCOSE MONITOR SYSTEM) W/DEVICE KIT Check twice a day.  . budesonide-formoterol (SYMBICORT) 160-4.5 MCG/ACT inhaler Inhale 2 puffs into the lungs 2 (two) times daily.  . colestipol (COLESTID) 1 G tablet TAKE ONE TABLET BY MOUTH TWICE DAILY AS NEEDED FOR DIARRHEA  . esomeprazole (NEXIUM) 20 MG capsule Take one capsule by mouth in the morning before breakfast.  . ferrous sulfate 325 (65 FE) MG tablet TAKE ONE TABLET BY MOUTH ONCE DAILY WITH BREAKFAST  . furosemide (LASIX) 40 MG tablet Take 1 tablet (40 mg total) by mouth daily.  Marland Kitchen glipiZIDE (GLUCOTROL XL) 2.5 MG 24 hr tablet TAKE ONE TABLET BY MOUTH ONCE DAILY  . ipratropium (ATROVENT) 0.02 % nebulizer solution Take 2.5 mLs (0.5 mg total) by nebulization every  6 (six) hours as needed for wheezing or shortness of breath. Dx COPD. ICD-9 Code 496.  Marland Kitchen Lancets (ONETOUCH ULTRASOFT) lancets One touch Delica. Patient to test blood sugar BID daily.  Dx Code: 250.00 Dx: Type II or unspecified type diabetes mellitus without mention of complication, not stated as uncontrolled (250.00)  . lisinopril (PRINIVIL,ZESTRIL) 40 MG tablet TAKE ONE TABLET BY MOUTH ONCE DAILY -  MUST  BE  SEEN  BEFORE  FURTHER  REFILLS  . metoprolol (LOPRESSOR) 100 MG tablet TAKE ONE TABLET BY MOUTH TWICE DAILY  . pravastatin (PRAVACHOL) 20 MG tablet Take 1 tablet (20 mg total) by mouth daily. Needs labs before future refills  . Probiotic Product (ACIDOPHILUS PROBIOTIC COMPLEX) TABS Take 1 tablet by mouth daily.  . VENTOLIN HFA 108 (90 BASE) MCG/ACT inhaler Inhale 2 puffs into the lungs every 6 (six) hours as needed for wheezing or shortness of breath. ICD - 10 J44.1 COPD  . [DISCONTINUED] lisinopril (PRINIVIL,ZESTRIL) 5 MG tablet TAKE ONE TABLET BY MOUTH ONCE DAILY  . [DISCONTINUED] metoprolol (LOPRESSOR) 100 MG tablet TAKE ONE TABLET BY MOUTH TWICE DAILY  . predniSONE (DELTASONE) 10 MG tablet Take 1 tablet (10 mg total) by mouth daily with breakfast.  . albuterol (PROVENTIL) (2.5 MG/3ML) 0.083% nebulizer solution 2.5 mg   . albuterol (PROVENTIL) (2.5 MG/3ML) 0.083% nebulizer solution 2.5 mg   . [EXPIRED] ipratropium-albuterol (DUONEB) 0.5-2.5 (3) MG/3ML nebulizer solution 3 mL   . [EXPIRED] methylPREDNISolone acetate (DEPO-MEDROL) injection 80 mg   . [EXPIRED] methylPREDNISolone sodium succinate (SOLU-MEDROL) 125 mg/2 mL injection 125 mg    No facility-administered encounter medications on file as of 11/07/2015.          Objective:   Physical Exam  Constitutional: He is oriented to person, place, and time. He appears well-developed and well-nourished.  HENT:  Head: Normocephalic and atraumatic.  Neck: Neck supple. No thyromegaly present.  Cardiovascular: Normal rate, regular rhythm  and normal heart sounds.   Pulmonary/Chest: Effort normal. He has wheezes.  Coarse diffuse rhonchi with expiratory wheezing. Breath sounds much improved after second albuterol treatment.  Musculoskeletal: He exhibits no edema.  Lymphadenopathy:    He has no cervical adenopathy.  Neurological: He is alert and oriented to person, place, and time.  Skin: Skin is warm and dry.  Psychiatric: He has a normal mood and affect. His behavior is normal.          Assessment & Plan:  COPD exacerbation- Patient was very short of breath and was defatting when he came into the office. His blood pressure was also quite high which is unusual for him. We gave him 1 DuoNeb and 2 albuterol breathing treatments with nebulizer. We also gave him IM Solu-Medrol and  Depo-Medrol. He felt much better before we let him go home. Encouraged him to go the emergency permit of the weekend if he feels like he is becoming more short of breath or suddenly gets worse. I did go ahead and send over 10 mg of prednisone to his local pharmacy that he can start in a couple of days as the Depo-Medrol is wearing off. X benefit from chronic low-dose steroids at this point. Follow-up in one month. No sign of fluid overload to indicate acute heart failure. Continue chronic oxygen therapy, 4 liter oxygen.    Hypertension-blood pressure came down nicely after the breathing treatments 3 well controlled. Continue current regimen.  The pill was identified as a statin. New prescription sent to the pharmacy for 1 year.  He also asked about getting a reclining chair that lips. He says he saw on TV where it was covered by his Medicare and is interested in getting that. He Artie has a hospital bed. He is homebound.

## 2015-11-07 NOTE — Telephone Encounter (Signed)
Error

## 2015-11-13 DIAGNOSIS — F341 Dysthymic disorder: Secondary | ICD-10-CM | POA: Diagnosis not present

## 2015-11-13 DIAGNOSIS — Z7984 Long term (current) use of oral hypoglycemic drugs: Secondary | ICD-10-CM | POA: Diagnosis not present

## 2015-11-13 DIAGNOSIS — E119 Type 2 diabetes mellitus without complications: Secondary | ICD-10-CM | POA: Diagnosis not present

## 2015-11-13 DIAGNOSIS — M47814 Spondylosis without myelopathy or radiculopathy, thoracic region: Secondary | ICD-10-CM | POA: Diagnosis not present

## 2015-11-13 DIAGNOSIS — Z87891 Personal history of nicotine dependence: Secondary | ICD-10-CM | POA: Diagnosis not present

## 2015-11-13 DIAGNOSIS — A047 Enterocolitis due to Clostridium difficile: Secondary | ICD-10-CM | POA: Diagnosis not present

## 2015-11-13 DIAGNOSIS — R093 Abnormal sputum: Secondary | ICD-10-CM | POA: Diagnosis not present

## 2015-11-13 DIAGNOSIS — R072 Precordial pain: Secondary | ICD-10-CM | POA: Diagnosis not present

## 2015-11-13 DIAGNOSIS — Z9981 Dependence on supplemental oxygen: Secondary | ICD-10-CM | POA: Diagnosis not present

## 2015-11-13 DIAGNOSIS — E785 Hyperlipidemia, unspecified: Secondary | ICD-10-CM | POA: Diagnosis not present

## 2015-11-13 DIAGNOSIS — R05 Cough: Secondary | ICD-10-CM | POA: Diagnosis not present

## 2015-11-13 DIAGNOSIS — Z7982 Long term (current) use of aspirin: Secondary | ICD-10-CM | POA: Diagnosis not present

## 2015-11-13 DIAGNOSIS — R0602 Shortness of breath: Secondary | ICD-10-CM | POA: Diagnosis not present

## 2015-11-13 DIAGNOSIS — H9190 Unspecified hearing loss, unspecified ear: Secondary | ICD-10-CM | POA: Diagnosis not present

## 2015-11-13 DIAGNOSIS — R197 Diarrhea, unspecified: Secondary | ICD-10-CM | POA: Diagnosis not present

## 2015-11-13 DIAGNOSIS — I10 Essential (primary) hypertension: Secondary | ICD-10-CM | POA: Diagnosis not present

## 2015-11-13 DIAGNOSIS — L905 Scar conditions and fibrosis of skin: Secondary | ICD-10-CM | POA: Diagnosis not present

## 2015-11-13 DIAGNOSIS — J441 Chronic obstructive pulmonary disease with (acute) exacerbation: Secondary | ICD-10-CM | POA: Diagnosis not present

## 2015-11-13 DIAGNOSIS — R079 Chest pain, unspecified: Secondary | ICD-10-CM | POA: Diagnosis not present

## 2015-11-25 ENCOUNTER — Other Ambulatory Visit: Payer: Self-pay | Admitting: Family Medicine

## 2015-11-27 ENCOUNTER — Other Ambulatory Visit: Payer: Self-pay

## 2015-11-27 NOTE — Patient Outreach (Signed)
[facility]) Care Management  11/27/2015  Jared Lee Jul 21, 1941 [id number]   No response from patient and/or [name] after 3 telephone calls and letter outreach.   PLAN: RNCM will refer to Josepha Pigg to close due to being unable to contact. RNCM will notify patients primary MD of inability to establish contact with patient.   Quinn Plowman RN,BSN,CCM [facility] Telephonic  [phone]

## 2015-12-01 ENCOUNTER — Other Ambulatory Visit: Payer: Self-pay | Admitting: *Deleted

## 2015-12-01 DIAGNOSIS — J441 Chronic obstructive pulmonary disease with (acute) exacerbation: Secondary | ICD-10-CM

## 2015-12-01 MED ORDER — ALBUTEROL SULFATE (2.5 MG/3ML) 0.083% IN NEBU: 2.5000 mg | INHALATION_SOLUTION | RESPIRATORY_TRACT | Status: DC | PRN

## 2015-12-02 ENCOUNTER — Ambulatory Visit: Payer: Medicare Other | Admitting: Family Medicine

## 2015-12-03 DIAGNOSIS — J189 Pneumonia, unspecified organism: Secondary | ICD-10-CM | POA: Diagnosis not present

## 2015-12-03 DIAGNOSIS — H9190 Unspecified hearing loss, unspecified ear: Secondary | ICD-10-CM | POA: Diagnosis not present

## 2015-12-03 DIAGNOSIS — J441 Chronic obstructive pulmonary disease with (acute) exacerbation: Secondary | ICD-10-CM | POA: Diagnosis not present

## 2015-12-03 DIAGNOSIS — E119 Type 2 diabetes mellitus without complications: Secondary | ICD-10-CM | POA: Diagnosis not present

## 2015-12-03 DIAGNOSIS — J9621 Acute and chronic respiratory failure with hypoxia: Secondary | ICD-10-CM | POA: Diagnosis not present

## 2015-12-03 DIAGNOSIS — I10 Essential (primary) hypertension: Secondary | ICD-10-CM | POA: Diagnosis not present

## 2015-12-03 DIAGNOSIS — R0602 Shortness of breath: Secondary | ICD-10-CM | POA: Diagnosis not present

## 2015-12-03 DIAGNOSIS — J439 Emphysema, unspecified: Secondary | ICD-10-CM | POA: Diagnosis not present

## 2015-12-03 DIAGNOSIS — J440 Chronic obstructive pulmonary disease with acute lower respiratory infection: Secondary | ICD-10-CM | POA: Diagnosis not present

## 2015-12-04 ENCOUNTER — Encounter: Payer: Self-pay | Admitting: Family Medicine

## 2015-12-04 ENCOUNTER — Ambulatory Visit: Payer: Medicare Other | Admitting: Family Medicine

## 2015-12-04 ENCOUNTER — Telehealth: Payer: Self-pay | Admitting: *Deleted

## 2015-12-04 DIAGNOSIS — J441 Chronic obstructive pulmonary disease with (acute) exacerbation: Secondary | ICD-10-CM | POA: Diagnosis not present

## 2015-12-04 DIAGNOSIS — Z7982 Long term (current) use of aspirin: Secondary | ICD-10-CM | POA: Diagnosis not present

## 2015-12-04 DIAGNOSIS — I10 Essential (primary) hypertension: Secondary | ICD-10-CM | POA: Diagnosis present

## 2015-12-04 DIAGNOSIS — H9190 Unspecified hearing loss, unspecified ear: Secondary | ICD-10-CM | POA: Diagnosis present

## 2015-12-04 DIAGNOSIS — J9621 Acute and chronic respiratory failure with hypoxia: Secondary | ICD-10-CM | POA: Diagnosis not present

## 2015-12-04 DIAGNOSIS — J189 Pneumonia, unspecified organism: Secondary | ICD-10-CM | POA: Diagnosis present

## 2015-12-04 DIAGNOSIS — R739 Hyperglycemia, unspecified: Secondary | ICD-10-CM | POA: Diagnosis not present

## 2015-12-04 DIAGNOSIS — Z79899 Other long term (current) drug therapy: Secondary | ICD-10-CM | POA: Diagnosis not present

## 2015-12-04 DIAGNOSIS — Z7984 Long term (current) use of oral hypoglycemic drugs: Secondary | ICD-10-CM | POA: Diagnosis not present

## 2015-12-04 DIAGNOSIS — E118 Type 2 diabetes mellitus with unspecified complications: Secondary | ICD-10-CM | POA: Diagnosis not present

## 2015-12-04 DIAGNOSIS — Z87891 Personal history of nicotine dependence: Secondary | ICD-10-CM | POA: Diagnosis not present

## 2015-12-04 DIAGNOSIS — E162 Hypoglycemia, unspecified: Secondary | ICD-10-CM | POA: Diagnosis not present

## 2015-12-04 DIAGNOSIS — H9193 Unspecified hearing loss, bilateral: Secondary | ICD-10-CM | POA: Diagnosis not present

## 2015-12-04 DIAGNOSIS — E11649 Type 2 diabetes mellitus with hypoglycemia without coma: Secondary | ICD-10-CM | POA: Diagnosis not present

## 2015-12-04 DIAGNOSIS — J440 Chronic obstructive pulmonary disease with acute lower respiratory infection: Secondary | ICD-10-CM | POA: Diagnosis present

## 2015-12-04 DIAGNOSIS — T50905A Adverse effect of unspecified drugs, medicaments and biological substances, initial encounter: Secondary | ICD-10-CM

## 2015-12-04 DIAGNOSIS — E1165 Type 2 diabetes mellitus with hyperglycemia: Secondary | ICD-10-CM | POA: Diagnosis not present

## 2015-12-04 DIAGNOSIS — F419 Anxiety disorder, unspecified: Secondary | ICD-10-CM | POA: Diagnosis present

## 2015-12-04 NOTE — Progress Notes (Signed)
   Subjective:    Patient ID: Jared Lee, male    DOB: 1941/07/28, 75 y.o.   MRN: [id number]  HPI    Review of Systems     Objective:   Physical Exam        Assessment & Plan:  Patient has been deemed a "no-show" for today's scheduled appointment.  Based on chief complaint and my chart review:  -Follow-up advised.  Contacting patient and urged to schedule visit in 1-2 weeks.  If necessary this was communicated to the patient via phone or mail on: 8:00 AM 12/04/2015

## 2015-12-04 NOTE — Telephone Encounter (Signed)
Med was denied however the medication should have been run through Medicare part B and not Medicare part D. Called the pharm and spoke with pharm and she said d/c PA because they submitted it to wrong insurance. Called and left message on pt's vm

## 2015-12-04 NOTE — Telephone Encounter (Signed)
PA initiated for albuterol ned solution through [employer]

## 2015-12-10 ENCOUNTER — Ambulatory Visit: Payer: Medicare Other | Admitting: Physician Assistant

## 2015-12-22 ENCOUNTER — Ambulatory Visit (INDEPENDENT_AMBULATORY_CARE_PROVIDER_SITE_OTHER): Payer: Medicare Other | Admitting: Family Medicine

## 2015-12-22 ENCOUNTER — Encounter: Payer: Self-pay | Admitting: Family Medicine

## 2015-12-22 VITALS — BP 102/60 | HR 81 | Temp 97.7°F | Wt 147.0 lb

## 2015-12-22 DIAGNOSIS — H9193 Unspecified hearing loss, bilateral: Secondary | ICD-10-CM

## 2015-12-22 DIAGNOSIS — I10 Essential (primary) hypertension: Secondary | ICD-10-CM

## 2015-12-22 DIAGNOSIS — E119 Type 2 diabetes mellitus without complications: Secondary | ICD-10-CM

## 2015-12-22 DIAGNOSIS — J441 Chronic obstructive pulmonary disease with (acute) exacerbation: Secondary | ICD-10-CM | POA: Diagnosis not present

## 2015-12-22 MED ORDER — METHYLPREDNISOLONE ACETATE 80 MG/ML IJ SUSP
80.0000 mg | Freq: Once | INTRAMUSCULAR | Status: AC
  Administered 2015-12-22: 80 mg via INTRAMUSCULAR

## 2015-12-22 MED ORDER — AZITHROMYCIN 250 MG PO TABS: 250.0000 mg | ORAL_TABLET | ORAL | Status: DC

## 2015-12-22 MED ORDER — METHYLPREDNISOLONE SODIUM SUCC 125 MG IJ SOLR
125.0000 mg | Freq: Once | INTRAMUSCULAR | Status: AC
  Administered 2015-12-22: 125 mg via INTRAMUSCULAR

## 2015-12-22 MED ORDER — DOXYCYCLINE HYCLATE 100 MG PO TABS: 100.0000 mg | ORAL_TABLET | Freq: Two times a day (BID) | ORAL | Status: DC

## 2015-12-22 MED ORDER — IPRATROPIUM-ALBUTEROL 0.5-2.5 (3) MG/3ML IN SOLN
3.0000 mL | Freq: Once | RESPIRATORY_TRACT | Status: AC
  Administered 2015-12-22: 3 mL via RESPIRATORY_TRACT

## 2015-12-22 NOTE — Patient Instructions (Signed)
Take the doxycycline twice a day for 7 days. Once finished the start the azithromycin twice a week for up to a year.

## 2015-12-22 NOTE — Progress Notes (Signed)
Subjective:    Patient ID: Jared Lee, male    DOB: May 11, 1941, 75 y.o.   MRN: [id number]  HPI COPD f/u - Is here today for hospital follow-up. He was seen at [facility] on April 5 and discharged home 3 days later on April 8. Diagnosis was acute on chronic respiratory failure with hypoxemia and COPD exacerbation. Improved on his baseline oxygen and was sent home with bronchodilator steroid taper and doxycycline for 2 additional days. Note he does have a chronic left base opacity. They did not adjust any of his medications. He says he feels like he's been more short of breath the last 8 or 9 days. He's also had gray sputum production. He has been using his nebulizer treatments at home. He says he would like a shot today.  Hypertension- Pt denies chest pain, SOB, dizziness, or heart palpitations.  Taking meds as directed w/o problems.  Denies medication side effects.    He is extremely hard of hearing it does make it difficult for the conversation but his son-in-law was present with him today   Diabetes-doing well overall. No hypoglycemic episodes.  Review of Systems  BP 102/60 mmHg  Pulse 81  Temp(Src) 97.7 F (36.5 C)  Wt 147 lb (66.679 kg)  SpO2 93%    Allergies  Allergen Reactions  . Augmentin [Amoxicillin-Pot Clavulanate] Nausea And Vomiting    Stomach pain as well.   [place] [Roflumilast] Other (See Comments)    HA, diarrhea   . Levaquin [Levofloxacin]   . Prednisone Other (See Comments)    Swelling    Past Medical History  Diagnosis Date  . Hypertension   . H/O colonoscopy 02/03/12    EGD as well. Mildy nodular gastritis.    Marland Kitchen COPD (chronic obstructive pulmonary disease) ([place])   . Normal cardiac stress test 04/14/12    [facility]  . Hyperlipemia     No past surgical history on file.  Social History   Social History  . Marital Status: Single    Spouse Name: N/A  . Number of Children: N/A  . Years of Education: N/A    Occupational History  . Not on file.   Social History Main Topics  . Smoking status: Former Smoker -- 1.00 packs/day for 66 years    Types: Cigarettes    Quit date: 08/31/2011  . Smokeless tobacco: Never Used  . Alcohol Use: No  . Drug Use: No  . Sexual Activity: Not on file   Other Topics Concern  . Not on file   Social History Narrative    Family History  Problem Relation Age of Onset  . Heart disease Father   . Hypertension Father   . Emphysema Father   . Emphysema Paternal Grandfather     Outpatient Encounter Prescriptions as of 12/22/2015  Medication Sig  . albuterol (PROVENTIL) (2.5 MG/3ML) 0.083% nebulizer solution Take 3 mLs (2.5 mg total) by nebulization every 4 (four) hours as needed for wheezing or shortness of breath. ICD 10 J44.1 COPD  . ALPRAZolam (XANAX) 0.25 MG tablet TAKE ONE TABLET BY MOUTH ONCE DAILY AS NEEDED FOR ANXIETY  . AMBULATORY NON FORMULARY MEDICATION Medication Name: Ambulatory oxygen cocentrator.  Pt is mobile within th home.  Pt requests portable concentrator. Using Advanced for oxygen needs.  . AMBULATORY NON FORMULARY MEDICATION Medication Name: One Touch Ultra Test Strips for BID Testing Daily. Dx Code: 250.00  Dx: Type II or unspecified type diabetes mellitus without mention  of complication, not stated as uncontrolled (250.00)  . aspirin 81 MG tablet Take 81 mg by mouth daily.  Marland Kitchen azithromycin (ZITHROMAX) 250 MG tablet Take 1 tablet (250 mg total) by mouth 3 (three) times a week.  . Blood Glucose Monitoring Suppl (BLOOD GLUCOSE MONITOR SYSTEM) W/DEVICE KIT Check twice a day.  . budesonide-formoterol (SYMBICORT) 160-4.5 MCG/ACT inhaler Inhale 2 puffs into the lungs 2 (two) times daily.  . colestipol (COLESTID) 1 G tablet TAKE ONE TABLET BY MOUTH TWICE DAILY AS NEEDED FOR DIARRHEA  . doxycycline (VIBRA-TABS) 100 MG tablet Take 1 tablet (100 mg total) by mouth 2 (two) times daily.  Marland Kitchen esomeprazole (NEXIUM) 20 MG capsule Take one capsule by mouth  in the morning before breakfast.  . ferrous sulfate 325 (65 FE) MG tablet TAKE ONE TABLET BY MOUTH ONCE DAILY WITH BREAKFAST  . furosemide (LASIX) 40 MG tablet Take 1 tablet (40 mg total) by mouth daily.  Marland Kitchen glipiZIDE (GLUCOTROL XL) 2.5 MG 24 hr tablet TAKE ONE TABLET BY MOUTH ONCE DAILY  . ipratropium (ATROVENT) 0.02 % nebulizer solution Take 2.5 mLs (0.5 mg total) by nebulization every 6 (six) hours as needed for wheezing or shortness of breath. Dx COPD. ICD-9 Code 496.  Marland Kitchen Lancets (ONETOUCH ULTRASOFT) lancets One touch Delica. Patient to test blood sugar BID daily.  Dx Code: 250.00 Dx: Type II or unspecified type diabetes mellitus without mention of complication, not stated as uncontrolled (250.00)  . lisinopril (PRINIVIL,ZESTRIL) 40 MG tablet TAKE ONE TABLET BY MOUTH ONCE DAILY -  MUST  BE  SEEN  BEFORE  FURTHER  REFILLS  . metoprolol (LOPRESSOR) 100 MG tablet TAKE ONE TABLET BY MOUTH TWICE DAILY  . pravastatin (PRAVACHOL) 20 MG tablet Take 1 tablet (20 mg total) by mouth daily.  . predniSONE (DELTASONE) 10 MG tablet Take 1 tablet (10 mg total) by mouth daily with breakfast.  . Probiotic Product (ACIDOPHILUS PROBIOTIC COMPLEX) TABS Take 1 tablet by mouth daily.  . VENTOLIN HFA 108 (90 BASE) MCG/ACT inhaler Inhale 2 puffs into the lungs every 6 (six) hours as needed for wheezing or shortness of breath. ICD - 10 J44.1 COPD  . [EXPIRED] ipratropium-albuterol (DUONEB) 0.5-2.5 (3) MG/3ML nebulizer solution 3 mL   . [EXPIRED] methylPREDNISolone acetate (DEPO-MEDROL) injection 80 mg   . [EXPIRED] methylPREDNISolone sodium succinate (SOLU-MEDROL) 125 mg/2 mL injection 125 mg    No facility-administered encounter medications on file as of 12/22/2015.          Objective:   Physical Exam  Constitutional: He is oriented to person, place, and time. He appears well-developed and well-nourished.  HENT:  Head: Normocephalic and atraumatic.  Cardiovascular: Normal rate, regular rhythm and normal heart  sounds.   Pulmonary/Chest: Effort normal and breath sounds normal.  Neurological: He is alert and oriented to person, place, and time.  Skin: Skin is warm and dry.  Psychiatric: He has a normal mood and affect. His behavior is normal.          Assessment & Plan:  COPD exacerbation - Given Solu-Medrol and Depo-Medrol IM here in the office today. He seems to get short-term relief with this. We also discussed maybe putting him on weekly prophylaxis with azithromycin to see if this helps reduce frequency of admissions etc. based on the new gold guidelines. Call if not improving or suddenly gets worse.  HTN - Well controlled. Continue current regimen. Follow up in 3 mo.   Diabetes-stable. I'll up in one month for repeat hemoglobin A1c. Last  A1c was 6.5.  Hearing loss is severe makes it very difficult to communicate.

## 2015-12-28 ENCOUNTER — Other Ambulatory Visit: Payer: Self-pay | Admitting: Family Medicine

## 2016-01-07 ENCOUNTER — Other Ambulatory Visit: Payer: Self-pay | Admitting: Family Medicine

## 2016-01-12 ENCOUNTER — Other Ambulatory Visit: Payer: Self-pay | Admitting: Family Medicine

## 2016-01-15 ENCOUNTER — Other Ambulatory Visit: Payer: Self-pay | Admitting: Family Medicine

## 2016-01-15 NOTE — Telephone Encounter (Signed)
Pt's son called about medication. rx sent.Jared Lee

## 2016-01-22 ENCOUNTER — Encounter: Payer: Self-pay | Admitting: Family Medicine

## 2016-01-22 ENCOUNTER — Ambulatory Visit (INDEPENDENT_AMBULATORY_CARE_PROVIDER_SITE_OTHER): Payer: Medicare Other | Admitting: Family Medicine

## 2016-01-22 VITALS — BP 138/66 | HR 84 | Temp 97.4°F | Wt 143.0 lb

## 2016-01-22 DIAGNOSIS — J441 Chronic obstructive pulmonary disease with (acute) exacerbation: Secondary | ICD-10-CM

## 2016-01-22 DIAGNOSIS — E099 Drug or chemical induced diabetes mellitus without complications: Secondary | ICD-10-CM

## 2016-01-22 LAB — POCT GLYCOSYLATED HEMOGLOBIN (HGB A1C): Hemoglobin A1C: 6.3

## 2016-01-22 MED ORDER — CEFUROXIME AXETIL 250 MG PO TABS: 250.0000 mg | ORAL_TABLET | Freq: Two times a day (BID) | ORAL | Status: DC

## 2016-01-22 MED ORDER — IPRATROPIUM-ALBUTEROL 0.5-2.5 (3) MG/3ML IN SOLN
3.0000 mL | Freq: Once | RESPIRATORY_TRACT | Status: AC
  Administered 2016-01-22: 3 mL via RESPIRATORY_TRACT

## 2016-01-22 MED ORDER — LISINOPRIL 20 MG PO TABS: 20.0000 mg | ORAL_TABLET | Freq: Every day | ORAL | Status: DC

## 2016-01-22 MED ORDER — METHYLPREDNISOLONE SODIUM SUCC 125 MG IJ SOLR
125.0000 mg | Freq: Once | INTRAMUSCULAR | Status: AC
  Administered 2016-01-22: 125 mg via INTRAMUSCULAR

## 2016-01-22 MED ORDER — METHYLPREDNISOLONE ACETATE 80 MG/ML IJ SUSP
80.0000 mg | Freq: Once | INTRAMUSCULAR | Status: AC
  Administered 2016-01-22: 80 mg via INTRAMUSCULAR

## 2016-01-22 NOTE — Progress Notes (Signed)
Subjective:    CC: COPD  HPI:  COPD-he says he really hasn't felt well since he was here 4 weeks ago. Still continues to be short of breath. As a matter fact he asked for a breathing treatment as soon as he came in the door today which is typical for him. He reports that he has had some fevers but none in the last 3-4 days. He reports that his sputum is a gray color and that he feels like his chest and sinuses are congested. I did try to put him on prophylactic azithromycin 3 times a week. Unfortunately after the first dose he started to get diarrhea and was unable to tolerate it. He is currently on 10 mg of prednisone daily. He's been using his nebulizer every 4 hours.  Diabetes-this is about unfortunate triggered by recurrent courses of prednisone. Is currently on glipizide 2.5 mg.  Hypertension- Pt denies chest pain, SOB, dizziness, or heart palpitations.  Taking meds as directed w/o problems.  Denies medication side effects.     Past medical history, Surgical history, Family history not pertinant except as noted below, Social history, Allergies, and medications have been entered into the medical record, reviewed, and corrections made.   Review of Systems: No fevers, chills, night sweats, weight loss, chest pain, or shortness of breath.   Objective:    General: Well Developed, well nourished, and in no acute distress.  Neuro: Alert and oriented x3, extra-ocular muscles intact, sensation grossly intact.  HEENT: Normocephalic, atraumatic  Skin: Warm and dry, no rashes. Cardiac: Regular rate and rhythm, no murmurs rubs or gallops, no lower extremity edema.  Respiratory: Clear to auscultation bilaterally. Not using accessory muscles, speaking in full sentences.  LE: he has bilateral feet edema, 1+    Impression and Recommendations:    COPD Exacerbation-given Solu-Medrol 125 mg and Depo-Medrol 80 mg IM here in the office. Given 2 albuterol nebulizer treatments. Continue with Symbicort.  Continue with home nebulizer every 4 hours. I did add azithromycin to his intolerance list. Because that he reports that he's coughing up gray sputum and has not really gotten better over the last month then recommend chest x-ray to rule out pneumonia. In the interim I'm going to go ahead and prescribe him cefuroxime 250 mg twice a day since he is intolerant to Augmentin, Levaquin. He can take doxycycline that he has used it so much that I really wonder male developed some resistance.  DM- A1c of 6.3 today which is well controlled. Continue current regimen. Patient declines to get up-to-date eye exam. Foot exam performed today.    HTN - well controlled.  He does have some ankle edema today but this is not unusual when he is on prednisone. We'll get chest are just make sure there is no sign of pulmonary edema. He is on Lasix daily. Will refill lisinopril, but will change to 20 mg. I'm not sure he is actually taking it consistently considering his last refill was in August of last year and he should have run out several months ago.  Due for labs today.

## 2016-01-23 LAB — CBC WITH DIFFERENTIAL/PLATELET
BASOS PCT: 0 %
Basophils Absolute: 0 cells/uL (ref 0–200)
EOS PCT: 0 %
Eosinophils Absolute: 0 cells/uL — ABNORMAL LOW (ref 15–500)
HEMATOCRIT: 42 % (ref 38.5–50.0)
HEMOGLOBIN: 13.8 g/dL (ref 13.2–17.1)
LYMPHS ABS: 940 {cells}/uL (ref 850–3900)
LYMPHS PCT: 10 %
MCH: 28.7 pg (ref 27.0–33.0)
MCHC: 32.9 g/dL (ref 32.0–36.0)
MCV: 87.3 fL (ref 80.0–100.0)
MONO ABS: 752 {cells}/uL (ref 200–950)
MPV: 11.6 fL (ref 7.5–12.5)
Monocytes Relative: 8 %
Neutro Abs: 7708 cells/uL (ref 1500–7800)
Neutrophils Relative %: 82 %
Platelets: 149 10*3/uL (ref 140–400)
RBC: 4.81 MIL/uL (ref 4.20–5.80)
RDW: 14.5 % (ref 11.0–15.0)
WBC: 9.4 10*3/uL (ref 3.8–10.8)

## 2016-01-23 LAB — COMPLETE METABOLIC PANEL WITH GFR
ALT: 16 U/L (ref 9–46)
AST: 23 U/L (ref 10–35)
Albumin: 3.8 g/dL (ref 3.6–5.1)
Alkaline Phosphatase: 58 U/L (ref 40–115)
BILIRUBIN TOTAL: 0.4 mg/dL (ref 0.2–1.2)
BUN: 20 mg/dL (ref 7–25)
CALCIUM: 8.4 mg/dL — AB (ref 8.6–10.3)
CHLORIDE: 105 mmol/L (ref 98–110)
CO2: 24 mmol/L (ref 20–31)
CREATININE: 0.97 mg/dL (ref 0.70–1.18)
GFR, EST AFRICAN AMERICAN: 87 mL/min (ref >=60)
GFR, EST NON AFRICAN AMERICAN: 76 mL/min (ref >=60)
GLUCOSE: 194 mg/dL — AB (ref 65–99)
Potassium: 5.1 mmol/L (ref 3.5–5.3)
Sodium: 139 mmol/L (ref 135–146)
TOTAL PROTEIN: 6 g/dL — AB (ref 6.1–8.1)

## 2016-01-23 LAB — LIPID PANEL
CHOLESTEROL: 215 mg/dL — AB (ref 125–200)
HDL: 94 mg/dL (ref >=40)
LDL CALC: 94 mg/dL (ref <130)
TRIGLYCERIDES: 137 mg/dL (ref <150)
Total CHOL/HDL Ratio: 2.3 Ratio (ref <=5.0)
VLDL: 27 mg/dL (ref <30)

## 2016-01-28 ENCOUNTER — Other Ambulatory Visit: Payer: Self-pay | Admitting: *Deleted

## 2016-01-28 MED ORDER — ALBUTEROL SULFATE (2.5 MG/3ML) 0.083% IN NEBU: 2.5000 mg | INHALATION_SOLUTION | RESPIRATORY_TRACT | Status: DC | PRN

## 2016-02-08 ENCOUNTER — Other Ambulatory Visit: Payer: Self-pay | Admitting: Family Medicine

## 2016-02-11 ENCOUNTER — Other Ambulatory Visit: Payer: Self-pay | Admitting: Family Medicine

## 2016-02-13 ENCOUNTER — Other Ambulatory Visit: Payer: Self-pay | Admitting: Family Medicine

## 2016-02-16 ENCOUNTER — Other Ambulatory Visit: Payer: Self-pay | Admitting: Family Medicine

## 2016-02-19 DIAGNOSIS — H2513 Age-related nuclear cataract, bilateral: Secondary | ICD-10-CM | POA: Diagnosis not present

## 2016-02-19 DIAGNOSIS — H25041 Posterior subcapsular polar age-related cataract, right eye: Secondary | ICD-10-CM | POA: Diagnosis not present

## 2016-02-19 DIAGNOSIS — H25013 Cortical age-related cataract, bilateral: Secondary | ICD-10-CM | POA: Diagnosis not present

## 2016-02-19 DIAGNOSIS — Z8679 Personal history of other diseases of the circulatory system: Secondary | ICD-10-CM | POA: Diagnosis not present

## 2016-02-19 DIAGNOSIS — H35033 Hypertensive retinopathy, bilateral: Secondary | ICD-10-CM | POA: Diagnosis not present

## 2016-02-19 DIAGNOSIS — H5231 Anisometropia: Secondary | ICD-10-CM | POA: Diagnosis not present

## 2016-02-19 DIAGNOSIS — H52223 Regular astigmatism, bilateral: Secondary | ICD-10-CM | POA: Insufficient documentation

## 2016-02-19 DIAGNOSIS — E119 Type 2 diabetes mellitus without complications: Secondary | ICD-10-CM | POA: Diagnosis not present

## 2016-02-19 DIAGNOSIS — I10 Essential (primary) hypertension: Secondary | ICD-10-CM | POA: Diagnosis not present

## 2016-02-24 DIAGNOSIS — E1165 Type 2 diabetes mellitus with hyperglycemia: Secondary | ICD-10-CM | POA: Diagnosis not present

## 2016-02-24 DIAGNOSIS — R079 Chest pain, unspecified: Secondary | ICD-10-CM | POA: Diagnosis not present

## 2016-02-24 DIAGNOSIS — J441 Chronic obstructive pulmonary disease with (acute) exacerbation: Secondary | ICD-10-CM | POA: Diagnosis not present

## 2016-02-24 DIAGNOSIS — J9622 Acute and chronic respiratory failure with hypercapnia: Secondary | ICD-10-CM | POA: Diagnosis not present

## 2016-02-24 DIAGNOSIS — R0682 Tachypnea, not elsewhere classified: Secondary | ICD-10-CM | POA: Diagnosis not present

## 2016-02-24 DIAGNOSIS — R0602 Shortness of breath: Secondary | ICD-10-CM | POA: Diagnosis not present

## 2016-02-24 DIAGNOSIS — E872 Acidosis: Secondary | ICD-10-CM | POA: Diagnosis not present

## 2016-02-24 DIAGNOSIS — J9621 Acute and chronic respiratory failure with hypoxia: Secondary | ICD-10-CM | POA: Diagnosis not present

## 2016-02-24 DIAGNOSIS — R05 Cough: Secondary | ICD-10-CM | POA: Diagnosis not present

## 2016-02-24 DIAGNOSIS — I10 Essential (primary) hypertension: Secondary | ICD-10-CM | POA: Diagnosis not present

## 2016-02-24 DIAGNOSIS — F419 Anxiety disorder, unspecified: Secondary | ICD-10-CM | POA: Diagnosis not present

## 2016-02-24 DIAGNOSIS — J440 Chronic obstructive pulmonary disease with acute lower respiratory infection: Secondary | ICD-10-CM | POA: Diagnosis not present

## 2016-02-25 DIAGNOSIS — J441 Chronic obstructive pulmonary disease with (acute) exacerbation: Secondary | ICD-10-CM | POA: Diagnosis present

## 2016-02-25 DIAGNOSIS — J9621 Acute and chronic respiratory failure with hypoxia: Secondary | ICD-10-CM | POA: Diagnosis present

## 2016-02-25 DIAGNOSIS — I10 Essential (primary) hypertension: Secondary | ICD-10-CM | POA: Diagnosis not present

## 2016-02-25 DIAGNOSIS — Z7982 Long term (current) use of aspirin: Secondary | ICD-10-CM | POA: Diagnosis not present

## 2016-02-25 DIAGNOSIS — J9622 Acute and chronic respiratory failure with hypercapnia: Secondary | ICD-10-CM | POA: Diagnosis not present

## 2016-02-25 DIAGNOSIS — F419 Anxiety disorder, unspecified: Secondary | ICD-10-CM | POA: Diagnosis present

## 2016-02-25 DIAGNOSIS — H9193 Unspecified hearing loss, bilateral: Secondary | ICD-10-CM | POA: Diagnosis not present

## 2016-02-25 DIAGNOSIS — R0602 Shortness of breath: Secondary | ICD-10-CM | POA: Diagnosis not present

## 2016-02-25 DIAGNOSIS — E785 Hyperlipidemia, unspecified: Secondary | ICD-10-CM | POA: Diagnosis present

## 2016-02-25 DIAGNOSIS — E872 Acidosis: Secondary | ICD-10-CM | POA: Diagnosis present

## 2016-02-25 DIAGNOSIS — J209 Acute bronchitis, unspecified: Secondary | ICD-10-CM | POA: Diagnosis present

## 2016-02-25 DIAGNOSIS — Z9981 Dependence on supplemental oxygen: Secondary | ICD-10-CM | POA: Diagnosis not present

## 2016-02-25 DIAGNOSIS — Z7951 Long term (current) use of inhaled steroids: Secondary | ICD-10-CM | POA: Diagnosis not present

## 2016-02-25 DIAGNOSIS — Z7984 Long term (current) use of oral hypoglycemic drugs: Secondary | ICD-10-CM | POA: Diagnosis not present

## 2016-02-25 DIAGNOSIS — E1165 Type 2 diabetes mellitus with hyperglycemia: Secondary | ICD-10-CM | POA: Diagnosis not present

## 2016-02-25 DIAGNOSIS — J440 Chronic obstructive pulmonary disease with acute lower respiratory infection: Secondary | ICD-10-CM | POA: Diagnosis present

## 2016-02-25 DIAGNOSIS — H9190 Unspecified hearing loss, unspecified ear: Secondary | ICD-10-CM | POA: Diagnosis present

## 2016-02-25 DIAGNOSIS — T380X5A Adverse effect of glucocorticoids and synthetic analogues, initial encounter: Secondary | ICD-10-CM | POA: Diagnosis not present

## 2016-02-25 DIAGNOSIS — Z87891 Personal history of nicotine dependence: Secondary | ICD-10-CM | POA: Diagnosis not present

## 2016-03-01 ENCOUNTER — Other Ambulatory Visit: Payer: Self-pay | Admitting: Family Medicine

## 2016-03-01 ENCOUNTER — Inpatient Hospital Stay: Payer: Medicare Other | Admitting: Family Medicine

## 2016-03-03 DIAGNOSIS — H25011 Cortical age-related cataract, right eye: Secondary | ICD-10-CM | POA: Diagnosis not present

## 2016-03-03 DIAGNOSIS — H2511 Age-related nuclear cataract, right eye: Secondary | ICD-10-CM | POA: Diagnosis not present

## 2016-03-13 DIAGNOSIS — H2512 Age-related nuclear cataract, left eye: Secondary | ICD-10-CM | POA: Insufficient documentation

## 2016-03-15 ENCOUNTER — Inpatient Hospital Stay: Payer: Medicare Other | Admitting: Family Medicine

## 2016-03-15 DIAGNOSIS — H25011 Cortical age-related cataract, right eye: Secondary | ICD-10-CM | POA: Diagnosis not present

## 2016-03-15 DIAGNOSIS — H2511 Age-related nuclear cataract, right eye: Secondary | ICD-10-CM | POA: Diagnosis not present

## 2016-03-15 DIAGNOSIS — H25041 Posterior subcapsular polar age-related cataract, right eye: Secondary | ICD-10-CM | POA: Diagnosis not present

## 2016-03-15 HISTORY — PX: CATARACT EXTRACTION: SUR2

## 2016-03-17 DIAGNOSIS — Z8249 Family history of ischemic heart disease and other diseases of the circulatory system: Secondary | ICD-10-CM | POA: Diagnosis not present

## 2016-03-17 DIAGNOSIS — R0602 Shortness of breath: Secondary | ICD-10-CM | POA: Diagnosis not present

## 2016-03-17 DIAGNOSIS — J449 Chronic obstructive pulmonary disease, unspecified: Secondary | ICD-10-CM | POA: Diagnosis not present

## 2016-03-17 DIAGNOSIS — R05 Cough: Secondary | ICD-10-CM | POA: Diagnosis not present

## 2016-03-17 DIAGNOSIS — Z87891 Personal history of nicotine dependence: Secondary | ICD-10-CM | POA: Diagnosis not present

## 2016-03-17 DIAGNOSIS — R067 Sneezing: Secondary | ICD-10-CM | POA: Diagnosis not present

## 2016-03-23 DIAGNOSIS — H2512 Age-related nuclear cataract, left eye: Secondary | ICD-10-CM | POA: Diagnosis not present

## 2016-03-28 ENCOUNTER — Other Ambulatory Visit: Payer: Self-pay | Admitting: Family Medicine

## 2016-03-29 ENCOUNTER — Other Ambulatory Visit: Payer: Self-pay | Admitting: *Deleted

## 2016-03-29 MED ORDER — VENTOLIN HFA 108 (90 BASE) MCG/ACT IN AERS: 2.0000 | INHALATION_SPRAY | Freq: Four times a day (QID) | RESPIRATORY_TRACT | 5 refills | Status: DC | PRN

## 2016-03-30 ENCOUNTER — Other Ambulatory Visit: Payer: Self-pay | Admitting: *Deleted

## 2016-03-30 MED ORDER — ALBUTEROL SULFATE (2.5 MG/3ML) 0.083% IN NEBU: 2.5000 mg | INHALATION_SOLUTION | RESPIRATORY_TRACT | 11 refills | Status: DC | PRN

## 2016-03-30 MED ORDER — ALBUTEROL SULFATE (2.5 MG/3ML) 0.083% IN NEBU: 2.5000 mg | INHALATION_SOLUTION | RESPIRATORY_TRACT | 6 refills | Status: DC | PRN

## 2016-04-05 ENCOUNTER — Encounter: Payer: Self-pay | Admitting: Family Medicine

## 2016-04-05 DIAGNOSIS — H2512 Age-related nuclear cataract, left eye: Secondary | ICD-10-CM | POA: Diagnosis not present

## 2016-04-05 DIAGNOSIS — H25012 Cortical age-related cataract, left eye: Secondary | ICD-10-CM | POA: Diagnosis not present

## 2016-04-05 HISTORY — PX: CATARACT EXTRACTION W/ INTRAOCULAR LENS IMPLANT: SHX1309

## 2016-04-06 ENCOUNTER — Inpatient Hospital Stay: Payer: Medicare Other | Admitting: Family Medicine

## 2016-04-07 ENCOUNTER — Other Ambulatory Visit: Payer: Self-pay | Admitting: Family Medicine

## 2016-04-07 DIAGNOSIS — I10 Essential (primary) hypertension: Secondary | ICD-10-CM

## 2016-04-08 ENCOUNTER — Other Ambulatory Visit: Payer: Self-pay | Admitting: *Deleted

## 2016-04-08 NOTE — Telephone Encounter (Signed)
error 

## 2016-04-13 ENCOUNTER — Inpatient Hospital Stay: Payer: Medicare Other | Admitting: Family Medicine

## 2016-04-14 ENCOUNTER — Other Ambulatory Visit: Payer: Self-pay | Admitting: Family Medicine

## 2016-04-15 ENCOUNTER — Encounter: Payer: Self-pay | Admitting: Family Medicine

## 2016-04-19 ENCOUNTER — Ambulatory Visit (INDEPENDENT_AMBULATORY_CARE_PROVIDER_SITE_OTHER): Payer: Medicare Other | Admitting: Family Medicine

## 2016-04-19 ENCOUNTER — Encounter: Payer: Self-pay | Admitting: Family Medicine

## 2016-04-19 VITALS — BP 152/83 | HR 71 | Resp 20 | Ht 71.0 in | Wt 132.0 lb

## 2016-04-19 DIAGNOSIS — E119 Type 2 diabetes mellitus without complications: Secondary | ICD-10-CM | POA: Diagnosis not present

## 2016-04-19 DIAGNOSIS — J441 Chronic obstructive pulmonary disease with (acute) exacerbation: Secondary | ICD-10-CM

## 2016-04-19 DIAGNOSIS — E099 Drug or chemical induced diabetes mellitus without complications: Secondary | ICD-10-CM

## 2016-04-19 DIAGNOSIS — Z23 Encounter for immunization: Secondary | ICD-10-CM | POA: Diagnosis not present

## 2016-04-19 LAB — POCT GLYCOSYLATED HEMOGLOBIN (HGB A1C): HEMOGLOBIN A1C: 6.1

## 2016-04-19 MED ORDER — ALBUTEROL SULFATE (2.5 MG/3ML) 0.083% IN NEBU
2.5000 mg | INHALATION_SOLUTION | Freq: Once | RESPIRATORY_TRACT | Status: AC
  Administered 2016-04-19: 2.5 mg via RESPIRATORY_TRACT

## 2016-04-19 MED ORDER — METHYLPREDNISOLONE SODIUM SUCC 125 MG IJ SOLR
125.0000 mg | Freq: Once | INTRAMUSCULAR | Status: AC
  Administered 2016-04-19: 125 mg via INTRAMUSCULAR

## 2016-04-19 MED ORDER — IPRATROPIUM-ALBUTEROL 0.5-2.5 (3) MG/3ML IN SOLN
3.0000 mL | Freq: Once | RESPIRATORY_TRACT | Status: AC
  Administered 2016-04-19: 3 mL via RESPIRATORY_TRACT

## 2016-04-19 MED ORDER — CEFUROXIME AXETIL 250 MG PO TABS: 250.0000 mg | ORAL_TABLET | Freq: Two times a day (BID) | ORAL | 0 refills | Status: DC

## 2016-04-19 MED ORDER — METHYLPREDNISOLONE ACETATE 80 MG/ML IJ SUSP
80.0000 mg | Freq: Once | INTRAMUSCULAR | Status: AC
  Administered 2016-04-19: 80 mg via INTRAMUSCULAR

## 2016-04-19 NOTE — Patient Instructions (Addendum)
You were given to steroid shots today. One is a fast acting and one is a long-acting that will hang around in your system for about 5 days. I also sent a prescription for an antibiotic to your pharmacy to pick up today and start. If you are not feeling significantly better after weeks and please let us know.

## 2016-04-19 NOTE — Progress Notes (Signed)
Subjective:    CC: Here today for hospital follow-up.   HPI: This in the emergency department on July 19 for COPD exacerbation at [facility] and [facility]. Been doing his breathing treatments at home but was not getting relief. Chest x-ray was negative for anything acute. After several breathing treatments as well as steroids he was discharged home. He says he doesn't feel well today as he is feeling short of breath. He's had some cough with some small amount of sputum production. No fevers chills or sweats. No other cold symptoms.  Diabetes - no hypoglycemic events. No wounds or sores that are not healing well. No increased thirst or urination. Checking glucose at home. Taking medications as prescribed without any side effects.  He has had cataract surgery since I last saw him.    Past medical history, Surgical history, Family history not pertinant except as noted below, Social history, Allergies, and medications have been entered into the medical record, reviewed, and corrections made.   Review of Systems: No fevers, chills, night sweats, weight loss, chest pain, or shortness of breath.   Objective:    General: Well Developed, well nourished, and in no acute distress.  Neuro: Alert and oriented x3, extra-ocular muscles intact, sensation grossly intact.  HEENT: Normocephalic, atraumatic  Skin: Warm and dry, no rashes. Cardiac: Regular rate and rhythm, no murmurs rubs or gallops, no lower extremity edema.  Respiratory: Clear to auscultation bilaterally. Not using accessory muscles, speaking in full sentences.   Impression and Recommendations:   COPD exacerbation- give Solu-Medrol 125 mg and Depo-Medrol 80 mg IM here in the office. Given 2 albuterol treatments as well. Continue with home regimen. We'll also treat with cefuroxime 250 mg twice a day since intolerant to Augmentin and Levaquin. Follow-up in 3 months.  DM- Well controlled. A1C of 6.1 today.  Continue current regimen.  Follow up in  3 mo.   Given flu shot today.

## 2016-04-19 NOTE — Addendum Note (Signed)
Addended by: Teddy Spike on: 04/19/2016 04:01 PM   Modules accepted: Orders

## 2016-04-20 ENCOUNTER — Telehealth: Payer: Self-pay

## 2016-04-20 NOTE — Telephone Encounter (Signed)
Blayke's son-in-law called and states he needs prednisone 10 mg. He states he needs it because it helps him breath. They also state it is the higher dose of the prednisone that bothers him not the 10 mg. Please advise.

## 2016-04-21 MED ORDER — PREDNISONE 10 MG PO TABS: 10.0000 mg | ORAL_TABLET | Freq: Every day | ORAL | 0 refills | Status: DC | PRN

## 2016-04-21 NOTE — Telephone Encounter (Signed)
Rx sent to pharmacy   

## 2016-04-21 NOTE — Telephone Encounter (Signed)
Left message advising about medication.

## 2016-05-10 DIAGNOSIS — R21 Rash and other nonspecific skin eruption: Secondary | ICD-10-CM | POA: Diagnosis not present

## 2016-05-10 DIAGNOSIS — Z7982 Long term (current) use of aspirin: Secondary | ICD-10-CM | POA: Diagnosis not present

## 2016-05-10 DIAGNOSIS — R05 Cough: Secondary | ICD-10-CM | POA: Diagnosis not present

## 2016-05-10 DIAGNOSIS — J449 Chronic obstructive pulmonary disease, unspecified: Secondary | ICD-10-CM | POA: Diagnosis not present

## 2016-05-10 DIAGNOSIS — E119 Type 2 diabetes mellitus without complications: Secondary | ICD-10-CM | POA: Diagnosis not present

## 2016-05-10 DIAGNOSIS — J40 Bronchitis, not specified as acute or chronic: Secondary | ICD-10-CM | POA: Diagnosis not present

## 2016-05-10 DIAGNOSIS — I10 Essential (primary) hypertension: Secondary | ICD-10-CM | POA: Diagnosis not present

## 2016-05-10 DIAGNOSIS — J441 Chronic obstructive pulmonary disease with (acute) exacerbation: Secondary | ICD-10-CM | POA: Diagnosis not present

## 2016-05-10 DIAGNOSIS — Z9841 Cataract extraction status, right eye: Secondary | ICD-10-CM | POA: Diagnosis not present

## 2016-05-10 DIAGNOSIS — E785 Hyperlipidemia, unspecified: Secondary | ICD-10-CM | POA: Diagnosis not present

## 2016-05-10 DIAGNOSIS — K219 Gastro-esophageal reflux disease without esophagitis: Secondary | ICD-10-CM | POA: Diagnosis not present

## 2016-05-10 DIAGNOSIS — R0602 Shortness of breath: Secondary | ICD-10-CM | POA: Diagnosis not present

## 2016-05-10 DIAGNOSIS — J9610 Chronic respiratory failure, unspecified whether with hypoxia or hypercapnia: Secondary | ICD-10-CM | POA: Diagnosis not present

## 2016-05-10 DIAGNOSIS — H9190 Unspecified hearing loss, unspecified ear: Secondary | ICD-10-CM | POA: Diagnosis not present

## 2016-05-10 DIAGNOSIS — E1165 Type 2 diabetes mellitus with hyperglycemia: Secondary | ICD-10-CM | POA: Diagnosis not present

## 2016-05-10 DIAGNOSIS — Z961 Presence of intraocular lens: Secondary | ICD-10-CM | POA: Diagnosis present

## 2016-05-10 DIAGNOSIS — Z7951 Long term (current) use of inhaled steroids: Secondary | ICD-10-CM | POA: Diagnosis not present

## 2016-05-10 DIAGNOSIS — F419 Anxiety disorder, unspecified: Secondary | ICD-10-CM | POA: Diagnosis not present

## 2016-05-10 DIAGNOSIS — Z9842 Cataract extraction status, left eye: Secondary | ICD-10-CM | POA: Diagnosis not present

## 2016-05-10 DIAGNOSIS — Z7984 Long term (current) use of oral hypoglycemic drugs: Secondary | ICD-10-CM | POA: Diagnosis not present

## 2016-05-10 DIAGNOSIS — Z87891 Personal history of nicotine dependence: Secondary | ICD-10-CM | POA: Diagnosis not present

## 2016-05-19 ENCOUNTER — Ambulatory Visit (INDEPENDENT_AMBULATORY_CARE_PROVIDER_SITE_OTHER): Payer: Medicare Other | Admitting: Physician Assistant

## 2016-05-19 ENCOUNTER — Encounter: Payer: Self-pay | Admitting: Physician Assistant

## 2016-05-19 VITALS — BP 130/66 | HR 84

## 2016-05-19 DIAGNOSIS — J441 Chronic obstructive pulmonary disease with (acute) exacerbation: Secondary | ICD-10-CM

## 2016-05-19 DIAGNOSIS — R0602 Shortness of breath: Secondary | ICD-10-CM | POA: Diagnosis not present

## 2016-05-19 MED ORDER — METHYLPREDNISOLONE SODIUM SUCC 125 MG IJ SOLR
125.0000 mg | Freq: Once | INTRAMUSCULAR | Status: AC
  Administered 2016-05-19: 125 mg via INTRAMUSCULAR

## 2016-05-19 MED ORDER — METHYLPREDNISOLONE ACETATE 80 MG/ML IJ SUSP
80.0000 mg | Freq: Once | INTRAMUSCULAR | Status: AC
  Administered 2016-05-19: 80 mg via INTRAMUSCULAR

## 2016-05-19 MED ORDER — IPRATROPIUM-ALBUTEROL 0.5-2.5 (3) MG/3ML IN SOLN
3.0000 mL | Freq: Once | RESPIRATORY_TRACT | Status: AC
  Administered 2016-05-19: 3 mL via RESPIRATORY_TRACT

## 2016-05-19 NOTE — Progress Notes (Addendum)
Subjective:     Patient ID: Jared Lee, male   DOB: 1940-09-03, 75 y.o.   MRN: [id number]  HPI Patient is a 75 y.o. Caucasian male with advance COPD with supplemental oxygen dependence and chronic respiratory failure presenting today following a recent hospitalization for shortness of breath with an acute exacerbation. The patient notes that his symptoms were improving following his discharge but have return today. The patient reports that he felt like he could not breath and had no energy. Patient states that he is currently taking all his medications compliantly. The patient notes that the nebulizer treatment improved his symptoms but he is still having a chronic cough with gray sputum production. Patient denies tobacco or alcohol use. Patient denies chest pain, palpitations, dysuria, diarrhea, or fever.  Review of Systems  Constitutional: Negative for activity change, appetite change, chills, diaphoresis, fatigue, fever and unexpected weight change.  HENT: Negative.   Eyes: Negative.   Respiratory: Positive for cough, chest tightness, shortness of breath and wheezing.   Cardiovascular: Negative for chest pain, palpitations and leg swelling.  Gastrointestinal: Negative.   Genitourinary: Negative.   Musculoskeletal: Negative.   Neurological: Negative.        Objective:   Physical Exam  Constitutional: He is oriented to person, place, and time. He appears well-developed and well-nourished. No distress.  HENT:  Head: Normocephalic and atraumatic.  Eyes: Conjunctivae and EOM are normal. Pupils are equal, round, and reactive to light. Right eye exhibits no discharge. Left eye exhibits no discharge. No scleral icterus.  Neck: Normal range of motion. Neck supple. No JVD present. No tracheal deviation present. No thyromegaly present.  Cardiovascular: Normal rate, regular rhythm, normal heart sounds and intact distal pulses.  Exam reveals no gallop and no friction rub.   No murmur  heard. Pulmonary/Chest: Effort normal. No stridor. No respiratory distress. He has wheezes. He has rales. He exhibits no tenderness.  Abdominal: Soft. Bowel sounds are normal. He exhibits no distension and no mass. There is no tenderness. There is no rebound and no guarding.  Lymphadenopathy:    He has no cervical adenopathy.  Neurological: He is alert and oriented to person, place, and time. No cranial nerve deficit. Coordination normal.  Skin: Skin is warm and dry. No rash noted. He is not diaphoretic. No erythema. No pallor.  Psychiatric: He has a normal mood and affect. His behavior is normal. Judgment and thought content normal.      Assessment:       Jared Lee was seen today for hospitalization follow-up and copd.  Diagnoses and all orders for this visit:  SOB (shortness of breath) -     ipratropium-albuterol (DUONEB) 0.5-2.5 (3) MG/3ML nebulizer solution 3 mL; Take 3 mLs by nebulization once. -     methylPREDNISolone sodium succinate (SOLU-MEDROL) 125 mg/2 mL injection 125 mg; Inject 2 mLs (125 mg total) into the muscle once. -     methylPREDNISolone acetate (DEPO-MEDROL) injection 80 mg; Inject 1 mL (80 mg total) into the muscle once.  COPD exacerbation (HCC) -     methylPREDNISolone sodium succinate (SOLU-MEDROL) 125 mg/2 mL injection 125 mg; Inject 2 mLs (125 mg total) into the muscle once. -     methylPREDNISolone acetate (DEPO-MEDROL) injection 80 mg; Inject 1 mL (80 mg total) into the muscle once.   Plan:     1. SOB/COPD exacerbation - Discussed with patient that he is currently taking all recommended medications for his condition at the highest available dose. Patient to continue on  current medication regimen including alterations that were made by the hospitalist. See attached medication list. Patient was given albuterol ipratropium nebulizer treatment, solu-medrol 125mg  IM and depo-medrol IM injection in clinic today. Patient was instructed to continue with current treatment  and seek immediate medical attention is symptoms worsen or it becomes difficult to breath.   Admit 05/10/2016 and discharge 09/13/2017[facility]: "Discharge plan of care discussed at length with the patient. All questions answered appropriately to the best of my knowledge. Given his severe COPD and age-related debility I have advised the patient that he not drive. He has a stepson and I advised him to ask his stepson to drive him around. Also the case manager gave him information regarding transportation help through Medicaid. He has prednisone listed as allergy but able to tolerate it. Advised him to follow up with his PCP in 1 week from discharge. At this time he verbalized clear understanding of the plan of care and is being discharged home in stable condition. However, given his chronic medical conditions he is high risk for rehospitalization."

## 2016-05-22 DIAGNOSIS — J441 Chronic obstructive pulmonary disease with (acute) exacerbation: Secondary | ICD-10-CM | POA: Diagnosis not present

## 2016-05-22 DIAGNOSIS — R079 Chest pain, unspecified: Secondary | ICD-10-CM | POA: Diagnosis not present

## 2016-05-22 DIAGNOSIS — H9190 Unspecified hearing loss, unspecified ear: Secondary | ICD-10-CM | POA: Diagnosis not present

## 2016-05-22 DIAGNOSIS — R0602 Shortness of breath: Secondary | ICD-10-CM | POA: Diagnosis not present

## 2016-05-22 DIAGNOSIS — R0600 Dyspnea, unspecified: Secondary | ICD-10-CM | POA: Diagnosis not present

## 2016-05-22 DIAGNOSIS — J9611 Chronic respiratory failure with hypoxia: Secondary | ICD-10-CM | POA: Diagnosis not present

## 2016-05-22 DIAGNOSIS — E119 Type 2 diabetes mellitus without complications: Secondary | ICD-10-CM | POA: Diagnosis not present

## 2016-05-22 DIAGNOSIS — Z87891 Personal history of nicotine dependence: Secondary | ICD-10-CM | POA: Diagnosis not present

## 2016-05-22 DIAGNOSIS — D696 Thrombocytopenia, unspecified: Secondary | ICD-10-CM | POA: Diagnosis not present

## 2016-05-23 DIAGNOSIS — I10 Essential (primary) hypertension: Secondary | ICD-10-CM | POA: Diagnosis not present

## 2016-05-23 DIAGNOSIS — F419 Anxiety disorder, unspecified: Secondary | ICD-10-CM | POA: Diagnosis present

## 2016-05-23 DIAGNOSIS — J441 Chronic obstructive pulmonary disease with (acute) exacerbation: Secondary | ICD-10-CM | POA: Diagnosis not present

## 2016-05-23 DIAGNOSIS — Z87891 Personal history of nicotine dependence: Secondary | ICD-10-CM | POA: Diagnosis not present

## 2016-05-23 DIAGNOSIS — Z7982 Long term (current) use of aspirin: Secondary | ICD-10-CM | POA: Diagnosis not present

## 2016-05-23 DIAGNOSIS — H9190 Unspecified hearing loss, unspecified ear: Secondary | ICD-10-CM | POA: Diagnosis present

## 2016-05-23 DIAGNOSIS — E119 Type 2 diabetes mellitus without complications: Secondary | ICD-10-CM | POA: Diagnosis not present

## 2016-05-23 DIAGNOSIS — I493 Ventricular premature depolarization: Secondary | ICD-10-CM | POA: Diagnosis not present

## 2016-05-23 DIAGNOSIS — E785 Hyperlipidemia, unspecified: Secondary | ICD-10-CM | POA: Diagnosis not present

## 2016-05-23 DIAGNOSIS — Z7951 Long term (current) use of inhaled steroids: Secondary | ICD-10-CM | POA: Diagnosis not present

## 2016-05-23 DIAGNOSIS — D696 Thrombocytopenia, unspecified: Secondary | ICD-10-CM | POA: Diagnosis not present

## 2016-05-23 DIAGNOSIS — K219 Gastro-esophageal reflux disease without esophagitis: Secondary | ICD-10-CM | POA: Diagnosis not present

## 2016-05-23 DIAGNOSIS — Z79899 Other long term (current) drug therapy: Secondary | ICD-10-CM | POA: Diagnosis not present

## 2016-05-23 DIAGNOSIS — J9611 Chronic respiratory failure with hypoxia: Secondary | ICD-10-CM | POA: Diagnosis not present

## 2016-06-02 ENCOUNTER — Other Ambulatory Visit: Payer: Self-pay | Admitting: Family Medicine

## 2016-06-03 ENCOUNTER — Other Ambulatory Visit: Payer: Self-pay | Admitting: Family Medicine

## 2016-06-04 ENCOUNTER — Inpatient Hospital Stay: Payer: Medicare Other | Admitting: Family Medicine

## 2016-06-14 DIAGNOSIS — R0602 Shortness of breath: Secondary | ICD-10-CM | POA: Diagnosis not present

## 2016-06-14 DIAGNOSIS — E119 Type 2 diabetes mellitus without complications: Secondary | ICD-10-CM | POA: Diagnosis not present

## 2016-06-14 DIAGNOSIS — I2510 Atherosclerotic heart disease of native coronary artery without angina pectoris: Secondary | ICD-10-CM | POA: Diagnosis present

## 2016-06-14 DIAGNOSIS — H9190 Unspecified hearing loss, unspecified ear: Secondary | ICD-10-CM | POA: Diagnosis present

## 2016-06-14 DIAGNOSIS — Z961 Presence of intraocular lens: Secondary | ICD-10-CM | POA: Diagnosis present

## 2016-06-14 DIAGNOSIS — J9611 Chronic respiratory failure with hypoxia: Secondary | ICD-10-CM | POA: Diagnosis not present

## 2016-06-14 DIAGNOSIS — Z9842 Cataract extraction status, left eye: Secondary | ICD-10-CM | POA: Diagnosis not present

## 2016-06-14 DIAGNOSIS — J189 Pneumonia, unspecified organism: Secondary | ICD-10-CM | POA: Diagnosis not present

## 2016-06-14 DIAGNOSIS — R197 Diarrhea, unspecified: Secondary | ICD-10-CM | POA: Diagnosis not present

## 2016-06-14 DIAGNOSIS — Z794 Long term (current) use of insulin: Secondary | ICD-10-CM | POA: Diagnosis not present

## 2016-06-14 DIAGNOSIS — Z9841 Cataract extraction status, right eye: Secondary | ICD-10-CM | POA: Diagnosis not present

## 2016-06-14 DIAGNOSIS — J449 Chronic obstructive pulmonary disease, unspecified: Secondary | ICD-10-CM | POA: Diagnosis not present

## 2016-06-14 DIAGNOSIS — Z87891 Personal history of nicotine dependence: Secondary | ICD-10-CM | POA: Diagnosis not present

## 2016-06-14 DIAGNOSIS — I10 Essential (primary) hypertension: Secondary | ICD-10-CM | POA: Diagnosis not present

## 2016-06-14 DIAGNOSIS — Z9981 Dependence on supplemental oxygen: Secondary | ICD-10-CM | POA: Diagnosis not present

## 2016-06-14 DIAGNOSIS — J40 Bronchitis, not specified as acute or chronic: Secondary | ICD-10-CM | POA: Diagnosis not present

## 2016-06-14 DIAGNOSIS — R05 Cough: Secondary | ICD-10-CM | POA: Diagnosis not present

## 2016-06-14 DIAGNOSIS — J440 Chronic obstructive pulmonary disease with acute lower respiratory infection: Secondary | ICD-10-CM | POA: Diagnosis present

## 2016-06-20 DIAGNOSIS — K219 Gastro-esophageal reflux disease without esophagitis: Secondary | ICD-10-CM | POA: Diagnosis present

## 2016-06-20 DIAGNOSIS — R600 Localized edema: Secondary | ICD-10-CM | POA: Diagnosis not present

## 2016-06-20 DIAGNOSIS — E785 Hyperlipidemia, unspecified: Secondary | ICD-10-CM | POA: Diagnosis present

## 2016-06-20 DIAGNOSIS — Z7982 Long term (current) use of aspirin: Secondary | ICD-10-CM | POA: Diagnosis not present

## 2016-06-20 DIAGNOSIS — Z515 Encounter for palliative care: Secondary | ICD-10-CM | POA: Diagnosis not present

## 2016-06-20 DIAGNOSIS — Z79899 Other long term (current) drug therapy: Secondary | ICD-10-CM | POA: Diagnosis not present

## 2016-06-20 DIAGNOSIS — E118 Type 2 diabetes mellitus with unspecified complications: Secondary | ICD-10-CM | POA: Diagnosis not present

## 2016-06-20 DIAGNOSIS — R079 Chest pain, unspecified: Secondary | ICD-10-CM | POA: Insufficient documentation

## 2016-06-20 DIAGNOSIS — R609 Edema, unspecified: Secondary | ICD-10-CM | POA: Diagnosis not present

## 2016-06-20 DIAGNOSIS — H9190 Unspecified hearing loss, unspecified ear: Secondary | ICD-10-CM | POA: Diagnosis present

## 2016-06-20 DIAGNOSIS — Z9981 Dependence on supplemental oxygen: Secondary | ICD-10-CM | POA: Diagnosis not present

## 2016-06-20 DIAGNOSIS — F419 Anxiety disorder, unspecified: Secondary | ICD-10-CM | POA: Diagnosis present

## 2016-06-20 DIAGNOSIS — I10 Essential (primary) hypertension: Secondary | ICD-10-CM | POA: Diagnosis not present

## 2016-06-20 DIAGNOSIS — J9611 Chronic respiratory failure with hypoxia: Secondary | ICD-10-CM | POA: Diagnosis not present

## 2016-06-20 DIAGNOSIS — R0789 Other chest pain: Secondary | ICD-10-CM | POA: Diagnosis not present

## 2016-06-20 DIAGNOSIS — Z7951 Long term (current) use of inhaled steroids: Secondary | ICD-10-CM | POA: Diagnosis not present

## 2016-06-20 DIAGNOSIS — Z87891 Personal history of nicotine dependence: Secondary | ICD-10-CM | POA: Diagnosis not present

## 2016-06-20 DIAGNOSIS — E119 Type 2 diabetes mellitus without complications: Secondary | ICD-10-CM | POA: Diagnosis present

## 2016-06-20 DIAGNOSIS — J441 Chronic obstructive pulmonary disease with (acute) exacerbation: Secondary | ICD-10-CM | POA: Diagnosis not present

## 2016-06-20 DIAGNOSIS — R0602 Shortness of breath: Secondary | ICD-10-CM | POA: Diagnosis not present

## 2016-06-20 DIAGNOSIS — J45909 Unspecified asthma, uncomplicated: Secondary | ICD-10-CM | POA: Diagnosis not present

## 2016-06-22 ENCOUNTER — Ambulatory Visit: Payer: Medicare Other | Admitting: Family Medicine

## 2016-06-25 DIAGNOSIS — J189 Pneumonia, unspecified organism: Secondary | ICD-10-CM | POA: Diagnosis not present

## 2016-06-25 DIAGNOSIS — J441 Chronic obstructive pulmonary disease with (acute) exacerbation: Secondary | ICD-10-CM | POA: Diagnosis not present

## 2016-06-26 DIAGNOSIS — J189 Pneumonia, unspecified organism: Secondary | ICD-10-CM | POA: Diagnosis not present

## 2016-06-26 DIAGNOSIS — J441 Chronic obstructive pulmonary disease with (acute) exacerbation: Secondary | ICD-10-CM | POA: Diagnosis not present

## 2016-06-28 ENCOUNTER — Ambulatory Visit: Payer: Medicare Other | Admitting: Family Medicine

## 2016-06-28 DIAGNOSIS — R001 Bradycardia, unspecified: Secondary | ICD-10-CM | POA: Diagnosis present

## 2016-06-28 DIAGNOSIS — J440 Chronic obstructive pulmonary disease with acute lower respiratory infection: Secondary | ICD-10-CM | POA: Diagnosis not present

## 2016-06-28 DIAGNOSIS — E119 Type 2 diabetes mellitus without complications: Secondary | ICD-10-CM | POA: Diagnosis not present

## 2016-06-28 DIAGNOSIS — I10 Essential (primary) hypertension: Secondary | ICD-10-CM | POA: Diagnosis present

## 2016-06-28 DIAGNOSIS — H9190 Unspecified hearing loss, unspecified ear: Secondary | ICD-10-CM | POA: Diagnosis present

## 2016-06-28 DIAGNOSIS — D696 Thrombocytopenia, unspecified: Secondary | ICD-10-CM | POA: Diagnosis present

## 2016-06-28 DIAGNOSIS — E118 Type 2 diabetes mellitus with unspecified complications: Secondary | ICD-10-CM | POA: Diagnosis not present

## 2016-06-28 DIAGNOSIS — Z87891 Personal history of nicotine dependence: Secondary | ICD-10-CM | POA: Diagnosis not present

## 2016-06-28 DIAGNOSIS — M7989 Other specified soft tissue disorders: Secondary | ICD-10-CM | POA: Insufficient documentation

## 2016-06-28 DIAGNOSIS — Z7984 Long term (current) use of oral hypoglycemic drugs: Secondary | ICD-10-CM | POA: Diagnosis not present

## 2016-06-28 DIAGNOSIS — I493 Ventricular premature depolarization: Secondary | ICD-10-CM | POA: Diagnosis present

## 2016-06-28 DIAGNOSIS — K219 Gastro-esophageal reflux disease without esophagitis: Secondary | ICD-10-CM | POA: Diagnosis present

## 2016-06-28 DIAGNOSIS — Z7951 Long term (current) use of inhaled steroids: Secondary | ICD-10-CM | POA: Diagnosis not present

## 2016-06-28 DIAGNOSIS — R079 Chest pain, unspecified: Secondary | ICD-10-CM | POA: Diagnosis not present

## 2016-06-28 DIAGNOSIS — E785 Hyperlipidemia, unspecified: Secondary | ICD-10-CM | POA: Diagnosis not present

## 2016-06-28 DIAGNOSIS — J189 Pneumonia, unspecified organism: Secondary | ICD-10-CM | POA: Diagnosis not present

## 2016-06-28 DIAGNOSIS — R0789 Other chest pain: Secondary | ICD-10-CM | POA: Diagnosis not present

## 2016-06-28 DIAGNOSIS — F419 Anxiety disorder, unspecified: Secondary | ICD-10-CM | POA: Diagnosis present

## 2016-06-28 DIAGNOSIS — J441 Chronic obstructive pulmonary disease with (acute) exacerbation: Secondary | ICD-10-CM | POA: Diagnosis not present

## 2016-06-28 DIAGNOSIS — J9611 Chronic respiratory failure with hypoxia: Secondary | ICD-10-CM | POA: Diagnosis not present

## 2016-06-28 DIAGNOSIS — J181 Lobar pneumonia, unspecified organism: Secondary | ICD-10-CM | POA: Diagnosis not present

## 2016-06-28 DIAGNOSIS — R0602 Shortness of breath: Secondary | ICD-10-CM | POA: Diagnosis not present

## 2016-06-28 DIAGNOSIS — Z7982 Long term (current) use of aspirin: Secondary | ICD-10-CM | POA: Diagnosis not present

## 2016-06-28 DIAGNOSIS — R600 Localized edema: Secondary | ICD-10-CM | POA: Diagnosis not present

## 2016-06-30 DIAGNOSIS — R001 Bradycardia, unspecified: Secondary | ICD-10-CM | POA: Insufficient documentation

## 2016-07-01 ENCOUNTER — Inpatient Hospital Stay: Payer: Medicare Other | Admitting: Family Medicine

## 2016-07-04 DIAGNOSIS — J441 Chronic obstructive pulmonary disease with (acute) exacerbation: Secondary | ICD-10-CM | POA: Diagnosis not present

## 2016-07-04 DIAGNOSIS — J189 Pneumonia, unspecified organism: Secondary | ICD-10-CM | POA: Diagnosis not present

## 2016-07-05 ENCOUNTER — Telehealth: Payer: Self-pay | Admitting: *Deleted

## 2016-07-05 DIAGNOSIS — J441 Chronic obstructive pulmonary disease with (acute) exacerbation: Secondary | ICD-10-CM | POA: Diagnosis not present

## 2016-07-05 DIAGNOSIS — J189 Pneumonia, unspecified organism: Secondary | ICD-10-CM | POA: Diagnosis not present

## 2016-07-05 NOTE — Telephone Encounter (Signed)
Gave VO for continued PT for pt.Jared Lee ]

## 2016-07-05 NOTE — Telephone Encounter (Signed)
lvm asking for verbal orders for pt.Jared Lee, Jared Lee

## 2016-07-06 ENCOUNTER — Ambulatory Visit (INDEPENDENT_AMBULATORY_CARE_PROVIDER_SITE_OTHER): Payer: Medicare Other | Admitting: Family Medicine

## 2016-07-06 ENCOUNTER — Encounter: Payer: Self-pay | Admitting: Family Medicine

## 2016-07-06 VITALS — BP 124/74 | HR 74 | Wt 144.0 lb

## 2016-07-06 DIAGNOSIS — J441 Chronic obstructive pulmonary disease with (acute) exacerbation: Secondary | ICD-10-CM | POA: Diagnosis not present

## 2016-07-06 DIAGNOSIS — J181 Lobar pneumonia, unspecified organism: Secondary | ICD-10-CM | POA: Diagnosis not present

## 2016-07-06 DIAGNOSIS — E119 Type 2 diabetes mellitus without complications: Secondary | ICD-10-CM

## 2016-07-06 DIAGNOSIS — J189 Pneumonia, unspecified organism: Secondary | ICD-10-CM

## 2016-07-06 LAB — GLUCOSE, POCT (MANUAL RESULT ENTRY): POC Glucose: 213 mg/dl — AB (ref 70–99)

## 2016-07-06 MED ORDER — IPRATROPIUM-ALBUTEROL 0.5-2.5 (3) MG/3ML IN SOLN
3.0000 mL | Freq: Once | RESPIRATORY_TRACT | Status: AC
  Administered 2016-07-06: 3 mL via RESPIRATORY_TRACT

## 2016-07-06 MED ORDER — BUDESONIDE-FORMOTEROL FUMARATE 160-4.5 MCG/ACT IN AERO: 2.0000 | INHALATION_SPRAY | Freq: Two times a day (BID) | RESPIRATORY_TRACT | 6 refills | Status: DC

## 2016-07-06 MED ORDER — UMECLIDINIUM BROMIDE 62.5 MCG/INH IN AEPB: 1.0000 | INHALATION_SPRAY | Freq: Every day | RESPIRATORY_TRACT | 5 refills | Status: DC

## 2016-07-06 NOTE — Progress Notes (Signed)
Subjective:    CC: Hospital follow-up, [place] [facility]  HPI: 75-year-old male comes in today for hospital follow-up. He was admitted to [facility] on October 30. He had a prior recent admission the week before. He went in for increased shortness of breath. He has end-stage COPD. Chest x-ray showed some consolidation at the left base consistent with pneumonia. He had a mildly elevated white blood cell count productive cough and some chest discomfort. He was initially given vancomycin and Zosyn emergency department and was admitted. He was continued on IV antibiotics and later changed to doxycycline orally because of poor vascular access.  Diabetes - no hypoglycemic events. No wounds or sores that are not healing well. No increased thirst or urination. Checking glucose at home. Taking medications as prescribed without any side effects.  Left lower no pneumonia/COPD-he did bring his oxygen with him but left it in the waiting room he did not wear into the exam room today. This is typical for him. He does say he is feeling some better overall. His ankles have been more swollen as well. He says if he elevates them they look much better and they look normal. He's been trying to elevate them a couple times throughout the day.  Past medical history, Surgical history, Family history not pertinant except as noted below, Social history, Allergies, and medications have been entered into the medical record, reviewed, and corrections made.   Review of Systems: No fevers, chills, night sweats, weight loss, chest pain, or shortness of breath.   Objective:    General: Well Developed, well nourished, and in no acute distress.  Neuro: Alert and oriented x3, extra-ocular muscles intact, sensation grossly intact.  HEENT: Normocephalic, atraumatic  Skin: Warm and dry, no rashes. Cardiac: Regular rate and rhythm, no murmurs rubs or gallops, 2+ pitting edema around his ankles bilateraly.   Respiratory:  Clear to auscultation bilaterallyWith fair air movement. Not using accessory muscles, speaking in full sentences.No crackles.   Impression and Recommendations:    DM- last A1c was at goal. He has on chronic steroids on and off over the last month that probably has visited his A1c. I'll see him back in 2 months and we can recheck at that time. I will see if they did an A1c in the hospital. Lab Results  Component Value Date   HGBA1C 6.1 04/19/2016   Left lower lobe pneumonia-lung exam with no acute findings today. He is feeling better overall. Pulse ox was 92% after albuterol treatment off of his oxygen.

## 2016-07-07 DIAGNOSIS — J441 Chronic obstructive pulmonary disease with (acute) exacerbation: Secondary | ICD-10-CM | POA: Diagnosis not present

## 2016-07-07 DIAGNOSIS — J189 Pneumonia, unspecified organism: Secondary | ICD-10-CM | POA: Diagnosis not present

## 2016-07-08 DIAGNOSIS — J189 Pneumonia, unspecified organism: Secondary | ICD-10-CM | POA: Diagnosis not present

## 2016-07-08 DIAGNOSIS — J441 Chronic obstructive pulmonary disease with (acute) exacerbation: Secondary | ICD-10-CM | POA: Diagnosis not present

## 2016-07-12 DIAGNOSIS — J441 Chronic obstructive pulmonary disease with (acute) exacerbation: Secondary | ICD-10-CM | POA: Diagnosis not present

## 2016-07-12 DIAGNOSIS — J189 Pneumonia, unspecified organism: Secondary | ICD-10-CM | POA: Diagnosis not present

## 2016-07-13 DIAGNOSIS — J441 Chronic obstructive pulmonary disease with (acute) exacerbation: Secondary | ICD-10-CM | POA: Diagnosis not present

## 2016-07-13 DIAGNOSIS — J189 Pneumonia, unspecified organism: Secondary | ICD-10-CM | POA: Diagnosis not present

## 2016-07-15 DIAGNOSIS — J441 Chronic obstructive pulmonary disease with (acute) exacerbation: Secondary | ICD-10-CM | POA: Diagnosis not present

## 2016-07-15 DIAGNOSIS — J189 Pneumonia, unspecified organism: Secondary | ICD-10-CM | POA: Diagnosis not present

## 2016-07-16 ENCOUNTER — Other Ambulatory Visit: Payer: Self-pay | Admitting: Family Medicine

## 2016-07-16 DIAGNOSIS — J189 Pneumonia, unspecified organism: Secondary | ICD-10-CM | POA: Diagnosis not present

## 2016-07-16 DIAGNOSIS — J441 Chronic obstructive pulmonary disease with (acute) exacerbation: Secondary | ICD-10-CM | POA: Diagnosis not present

## 2016-07-17 ENCOUNTER — Other Ambulatory Visit: Payer: Self-pay | Admitting: Family Medicine

## 2016-07-20 ENCOUNTER — Ambulatory Visit: Payer: Medicare Other | Admitting: Family Medicine

## 2016-07-20 DIAGNOSIS — J189 Pneumonia, unspecified organism: Secondary | ICD-10-CM | POA: Diagnosis not present

## 2016-07-20 DIAGNOSIS — J441 Chronic obstructive pulmonary disease with (acute) exacerbation: Secondary | ICD-10-CM | POA: Diagnosis not present

## 2016-07-25 DIAGNOSIS — S32018A Other fracture of first lumbar vertebra, initial encounter for closed fracture: Secondary | ICD-10-CM | POA: Diagnosis not present

## 2016-07-25 DIAGNOSIS — M5489 Other dorsalgia: Secondary | ICD-10-CM | POA: Diagnosis not present

## 2016-07-25 DIAGNOSIS — Z87891 Personal history of nicotine dependence: Secondary | ICD-10-CM | POA: Diagnosis not present

## 2016-07-25 DIAGNOSIS — R52 Pain, unspecified: Secondary | ICD-10-CM | POA: Diagnosis not present

## 2016-07-25 DIAGNOSIS — Z79899 Other long term (current) drug therapy: Secondary | ICD-10-CM | POA: Diagnosis not present

## 2016-07-25 DIAGNOSIS — M25511 Pain in right shoulder: Secondary | ICD-10-CM | POA: Diagnosis not present

## 2016-07-25 DIAGNOSIS — M4856XA Collapsed vertebra, not elsewhere classified, lumbar region, initial encounter for fracture: Secondary | ICD-10-CM | POA: Diagnosis not present

## 2016-07-25 DIAGNOSIS — M545 Low back pain: Secondary | ICD-10-CM | POA: Diagnosis not present

## 2016-07-25 DIAGNOSIS — S32010A Wedge compression fracture of first lumbar vertebra, initial encounter for closed fracture: Secondary | ICD-10-CM | POA: Diagnosis not present

## 2016-07-27 DIAGNOSIS — J441 Chronic obstructive pulmonary disease with (acute) exacerbation: Secondary | ICD-10-CM | POA: Diagnosis not present

## 2016-07-27 DIAGNOSIS — J189 Pneumonia, unspecified organism: Secondary | ICD-10-CM | POA: Diagnosis not present

## 2016-07-28 DIAGNOSIS — J441 Chronic obstructive pulmonary disease with (acute) exacerbation: Secondary | ICD-10-CM | POA: Diagnosis not present

## 2016-07-28 DIAGNOSIS — J189 Pneumonia, unspecified organism: Secondary | ICD-10-CM | POA: Diagnosis not present

## 2016-07-29 ENCOUNTER — Other Ambulatory Visit: Payer: Self-pay | Admitting: Family Medicine

## 2016-08-03 ENCOUNTER — Other Ambulatory Visit: Payer: Self-pay | Admitting: Family Medicine

## 2016-08-03 ENCOUNTER — Ambulatory Visit (INDEPENDENT_AMBULATORY_CARE_PROVIDER_SITE_OTHER): Payer: Medicare Other | Admitting: Family Medicine

## 2016-08-03 ENCOUNTER — Encounter: Payer: Self-pay | Admitting: Family Medicine

## 2016-08-03 VITALS — BP 136/69 | HR 69

## 2016-08-03 DIAGNOSIS — E119 Type 2 diabetes mellitus without complications: Secondary | ICD-10-CM | POA: Diagnosis not present

## 2016-08-03 DIAGNOSIS — S32010A Wedge compression fracture of first lumbar vertebra, initial encounter for closed fracture: Secondary | ICD-10-CM | POA: Diagnosis not present

## 2016-08-03 DIAGNOSIS — J441 Chronic obstructive pulmonary disease with (acute) exacerbation: Secondary | ICD-10-CM

## 2016-08-03 LAB — POCT GLYCOSYLATED HEMOGLOBIN (HGB A1C): Hemoglobin A1C: 6.1

## 2016-08-03 MED ORDER — CALCITONIN (SALMON) 200 UNIT/ACT NA SOLN: 1.0000 | Freq: Every day | NASAL | 3 refills | Status: DC

## 2016-08-03 MED ORDER — ALBUTEROL SULFATE (2.5 MG/3ML) 0.083% IN NEBU
2.5000 mg | INHALATION_SOLUTION | Freq: Once | RESPIRATORY_TRACT | Status: AC
  Administered 2016-08-03: 2.5 mg via RESPIRATORY_TRACT

## 2016-08-03 NOTE — Progress Notes (Signed)
Subjective:    CC:   HPI:  Was sitting in his recline and went to get up and felt a grabbing sensation in his low back.  This started about a week ago.  No fall or injury.  He went to the emergency department at [facility] and [facility]. He did do an x-ray and he was noted to have a close complex compression fracture of the first lumbar vertebra. He was given Percocet for pain control and told to follow-up. He says the Percocet has not really helped relieve his pain and is just making him drowsy so he stopped taking it. He said that they talk to him in the hospital about kyphoplasty and he is very interested in doing that to relieve his pain.He still rates his pain an 8 out of 10 today.  Diabetes-he is taking his medications regularly without any side effects or problems.  COPD-stable right now. He would like to have a nebulizer treatment while he is here today because it says that's when he usually gets home. He said multiple admissions for COPD particular at [facility]. His last admission was November 3. [place] is taking his medications regularly and he does have home health coming into the home.  Past medical history, Surgical history, Family history not pertinant except as noted below, Social history, Allergies, and medications have been entered into the medical record, reviewed, and corrections made.   Review of Systems: No fevers, chills, night sweats, weight loss, chest pain, or shortness of breath.   Objective:    General: Well Developed, well nourished, and in no acute distress.  Neuro: Alert and oriented x3, extra-ocular muscles intact, sensation grossly intact.  HEENT: Normocephalic, atraumatic  Skin: Warm and dry, no rashes. Cardiac: Regular rate and rhythm, no murmurs rubs or gallops, no lower extremity edema.  Respiratory: Clear to auscultation bilaterally. Not using accessory muscles, speaking in full sentences.   Impression and Recommendations:   L1 compression  fracture - he is very interested in having kyphoplasty done for pain relief. Explained that sometimes this procedure is not successful and increase his risk for fractures above these areas. He would still like to have this done. We'll place referral as he would like to have it done at [facility] closer to where he lives.  DM-Hemoglobin A1c at goal. Continue current regimen. Follow-up in 3-4 months. Lab Results  Component Value Date   HGBA1C 6.1 08/03/2016   COPD - Stable. Patient given albuterol nebulizer treatment while here in the office. The changes made today.

## 2016-08-04 ENCOUNTER — Other Ambulatory Visit: Payer: Self-pay | Admitting: *Deleted

## 2016-08-04 ENCOUNTER — Ambulatory Visit
Admission: RE | Admit: 2016-08-04 | Discharge: 2016-08-04 | Disposition: A | Discharge status: Home / Self Care | Payer: Medicare Other | Source: Ambulatory Visit | Attending: Family Medicine | Admitting: Family Medicine

## 2016-08-04 DIAGNOSIS — S32010A Wedge compression fracture of first lumbar vertebra, initial encounter for closed fracture: Secondary | ICD-10-CM

## 2016-08-04 DIAGNOSIS — J189 Pneumonia, unspecified organism: Secondary | ICD-10-CM | POA: Diagnosis not present

## 2016-08-04 DIAGNOSIS — J441 Chronic obstructive pulmonary disease with (acute) exacerbation: Secondary | ICD-10-CM | POA: Diagnosis not present

## 2016-08-04 HISTORY — PX: IR GENERIC HISTORICAL: IMG1180011

## 2016-08-04 NOTE — Consult Note (Addendum)
Chief Complaint: Patient was seen in consultation today for  Chief Complaint  Patient presents with  . Advice Only    Kyphoplasty consult of first lumbar vertebra   at the request of Metheney,Catherine D  Referring Physician(s): Metheney,Catherine D  Supervising Physician: [name]  [facility] Status: [facility]   History of Present Illness: Jared Lee is a 75 y.o. male who describes sudden low back pain while arising from a chair approximately 10 days ago. No fall or other trauma. Went to the  ED. Radiographs demonstrated new mild L1 compression deformity since earlier films. He was given Percocet for pain control. He finds that this does not relieve the pain, with drowsiness as a side effect. He rates his  pain 9 out of 10 on the visual analog pain scale. He's had no previous known spinal fractures nor surgery. He is on no anticoagulation.  Past Medical History:  Diagnosis Date  . COPD (chronic obstructive pulmonary disease) ([place])   . H/O colonoscopy 02/03/12   EGD as well. Mildy nodular gastritis.    . Hyperlipemia   . Hypertension   . Normal cardiac stress test 04/14/12   High POint REgional    Past Surgical History:  Procedure Laterality Date  . CATARACT EXTRACTION Right 03/15/2016  . CATARACT EXTRACTION W/ INTRAOCULAR LENS IMPLANT Left 04/05/2016    Allergies: Augmentin [amoxicillin-pot clavulanate]; Azithromycin; Daliresp [roflumilast]; Levaquin [levofloxacin]; and Prednisone  Medications: Prior to Admission medications   Medication Sig Start Date End Date Taking? Authorizing Provider  albuterol (PROVENTIL) (2.5 MG/3ML) 0.083% nebulizer solution Take 3 mLs (2.5 mg total) by nebulization every 4 (four) hours as needed for wheezing or shortness of breath. ICD 10 J44.1 COPD 03/30/16  Yes Hali Marry, MD  ALPRAZolam Duanne Moron) 0.25 MG tablet Take 0.25 mg by mouth.   Yes Historical Provider, MD  AMBULATORY NON FORMULARY MEDICATION Medication Name: Ambulatory  oxygen cocentrator.  Pt is mobile within th home.  Pt requests portable concentrator. Using [facility] for oxygen needs. 11/14/12  Yes Hali Marry, MD  AMBULATORY NON FORMULARY MEDICATION Medication Name: One Touch Ultra Test Strips for BID Testing Daily. Dx Code: 250.00  Dx: Type II or unspecified type diabetes mellitus without mention of complication, not stated as uncontrolled (250.00) 02/07/14  Yes Hali Marry, MD  aspirin 81 MG tablet Take 81 mg by mouth daily.   Yes Historical Provider, MD  Blood Glucose Monitoring Suppl (BLOOD GLUCOSE MONITOR SYSTEM) W/DEVICE KIT Check twice a day. 02/05/14  Yes Hali Marry, MD  budesonide-formoterol [facility]) 160-4.5 MCG/ACT inhaler Inhale 2 puffs into the lungs 2 (two) times daily. 07/06/16  Yes Hali Marry, MD  calcitonin, salmon, (MIACALCIN/FORTICAL) 200 UNIT/ACT nasal spray Place 1 spray into alternate nostrils daily. 08/03/16  Yes Hali Marry, MD  colestipol (COLESTID) 1 g tablet TAKE ONE TABLET BY MOUTH TWICE DAILY AS NEEDED FOR DIARRHEA 07/29/16  Yes Hali Marry, MD  ferrous sulfate 325 (65 FE) MG tablet TAKE ONE TABLET BY MOUTH ONCE DAILY WITH BREAKFAST 11/25/15  Yes Hali Marry, MD  furosemide (LASIX) 40 MG tablet TAKE ONE TABLET BY MOUTH ONCE DAILY 12/29/15  Yes Hali Marry, MD  glipiZIDE (GLUCOTROL XL) 2.5 MG 24 hr tablet TAKE ONE TABLET BY MOUTH ONCE DAILY 07/16/16  Yes Hali Marry, MD  ipratropium (ATROVENT) 0.02 % nebulizer solution Take 2.5 mLs (0.5 mg total) by nebulization every 6 (six) hours as needed for wheezing or shortness of breath.  Dx COPD. ICD-9 Code 496. 03/26/14  Yes Hali Marry, MD  lisinopril (PRINIVIL,ZESTRIL) 5 MG tablet TAKE ONE TABLET BY MOUTH ONCE DAILY 06/03/16  Yes Hali Marry, MD  metoprolol (LOPRESSOR) 100 MG tablet TAKE ONE TABLET BY MOUTH TWICE DAILY 07/19/16  Yes Hali Marry, MD  NEXIUM 20 MG capsule TAKE ONE CAPSULE BY MOUTH  IN THE MORNING BEFORE BREAKFAST 04/07/16  Yes Hali Marry, MD  oxyCODONE-acetaminophen (PERCOCET/ROXICET) 5-325 MG tablet  07/26/16  Yes Historical Provider, MD  Probiotic Product (ACIDOPHILUS PROBIOTIC COMPLEX) TABS Take 1 tablet by mouth daily. 08/05/14  Yes Hali Marry, MD  umeclidinium bromide (INCRUSE ELLIPTA) 62.5 MCG/INH AEPB Inhale 1 puff into the lungs daily. 07/06/16  Yes Hali Marry, MD  VENTOLIN HFA 108 [phone] Base) MCG/ACT inhaler Inhale 2 puffs into the lungs every 6 (six) hours as needed for wheezing or shortness of breath. ICD - 10 J44.1 COPD 03/29/16  Yes Hali Marry, MD  metoprolol (LOPRESSOR) 100 MG tablet TAKE ONE TABLET BY MOUTH TWICE DAILY 04/11/15   Hali Marry, MD  pravastatin (PRAVACHOL) 20 MG tablet Take 1 tablet (20 mg total) by mouth daily. 11/25/15   Hali Marry, MD     Family History  Problem Relation Age of Onset  . Heart disease Father   . Hypertension Father   . Emphysema Father   . Emphysema Paternal Grandfather     Social History   Social History  . Marital status: Single    Spouse name: N/A  . Number of children: N/A  . Years of education: N/A   Social History Main Topics  . Smoking status: Former Smoker    Packs/day: 1.00    Years: 66.00    Types: Cigarettes    Quit date: 08/31/2011  . Smokeless tobacco: Never Used  . Alcohol use No  . Drug use: No  . Sexual activity: Not on file   Other Topics Concern  . Not on file   Social History Narrative  . No narrative on file  He was accompanied today by his stepson.  ECOG Status: 2 - Symptomatic, <50% confined to bed  Review of Systems: A 12 point ROS discussed and pertinent positives are indicated in the HPI above.  All other systems are negative.  Review of Systems  Vital Signs: BP 107/61 (BP Location: Left Arm, Patient Position: Sitting, Cuff Size: Normal)   Temp 97.5 F (36.4 C) (Oral)   Resp 20   Ht 5' 11"  (1.803 m)   Wt 138 lb (62.6  kg)   SpO2 95%   BMI 19.25 kg/m   Physical Exam  Mallampati Score:     Imaging: LUMBAR SPINE - COMPLETE 4+ VIEW  COMPARISON: 11/08/2014  FINDINGS: Examination is limited due to large amount of gas and stool throughout the colon. Normal alignment of the lumbar vertebrae. There is mild compression of the superior endplate of L1 with slight anterior cortical irregularity. This is new since the previous study and may represent acute compression. No retropulsion of fracture fragments. Otherwise, no compression deformities demonstrated. No expansile or destructive bone lesions. Diffuse aortoiliac vascular calcifications.  IMPRESSION: Mild compression of superior endplate of L1, new since prior study. This may represent acute compression. Normal alignment. No retropulsion of fracture fragments.   Electronically Signed By: Lucienne Capers M.D. On: 07/25/2016 21:53    Labs:  CBC:  Recent Labs  01/22/16 1503  WBC 9.4  HGB 13.8  HCT 42.0  PLT 149  COAGS: No results for input(s): INR, APTT in the last 8760 hours.  BMP:  Recent Labs  01/22/16 1503  NA 139  K 5.1  CL 105  CO2 24  GLUCOSE 194*  BUN 20  CALCIUM 8.4*  CREATININE 0.97  GFRNONAA 76  GFRAA 87    LIVER FUNCTION TESTS:  Recent Labs  01/22/16 1503  BILITOT 0.4  AST 23  ALT 16  ALKPHOS 58  PROT 6.0*  ALBUMIN 3.8    TUMOR MARKERS: No results for input(s): AFPTM, CEA, CA199, CHROMGRNA in the last 8760 hours.  Assessment and Plan:  My impression is that this patient's persistent severe lumbar pain is likely secondary to his subacute L1 compression fracture. He has tried an adequate course of conservative therapy including narcotic level pain medication, with incomplete pain relief, and side effects. He may be an appropriate candidate for  kyphoplasty.  To complete his workup, I would require lumbar MR to confirm this is a nonhealed uncomplicated compression fracture and exclude burst  fracture or other unstable injury. This would also help evaluate the possibility of pathologic fracture.  We did spend most of the consultation time discussing the pathophysiology of lumbar compression fractures. We discussed treatment options including conservative treatment, kyphoplasty, vertebroplasty, and open surgical fixation. We discussed in detail the kyphoplasty technique, anticipated benefits, time course of symptom resolution, possible risks and complications, and continued risk of additional level fractures with or without treatment. We discussed the need for bone building therapy long-term. He seemed to understand and was motivated to proceed. Accordingly, we'll set up an MR lumbar spine and assuming this reveals no unexpected findings except the subacute unhealed L1 compression fracture, we can set him up as an outpatient for L1 kyphoplasty under moderate sedation. Both the MRIand  procedure can be done at [facility], at the patient's request.   Thank you for this interesting consult.  I greatly enjoyed meeting JAVIAN NUDD and look forward to participating in their care.  A copy of this report was sent to the requesting provider on this date.  Electronically Signed: Buddie Marston III, DAYNE Kamalei Roeder 08/04/2016, 3:19 PM   I spent a total of  40 Minutes   in face to face in clinical consultation, greater than 50% of which was counseling/coordinating care for painful lumbar compression fracture.

## 2016-08-11 DIAGNOSIS — M5136 Other intervertebral disc degeneration, lumbar region: Secondary | ICD-10-CM | POA: Diagnosis not present

## 2016-08-11 DIAGNOSIS — M545 Low back pain: Secondary | ICD-10-CM | POA: Diagnosis not present

## 2016-08-11 DIAGNOSIS — S32010D Wedge compression fracture of first lumbar vertebra, subsequent encounter for fracture with routine healing: Secondary | ICD-10-CM | POA: Diagnosis not present

## 2016-08-16 DIAGNOSIS — R05 Cough: Secondary | ICD-10-CM | POA: Diagnosis not present

## 2016-08-16 DIAGNOSIS — Z9841 Cataract extraction status, right eye: Secondary | ICD-10-CM | POA: Diagnosis not present

## 2016-08-16 DIAGNOSIS — Z87891 Personal history of nicotine dependence: Secondary | ICD-10-CM | POA: Diagnosis not present

## 2016-08-16 DIAGNOSIS — I10 Essential (primary) hypertension: Secondary | ICD-10-CM | POA: Diagnosis not present

## 2016-08-16 DIAGNOSIS — M549 Dorsalgia, unspecified: Secondary | ICD-10-CM | POA: Diagnosis not present

## 2016-08-16 DIAGNOSIS — E119 Type 2 diabetes mellitus without complications: Secondary | ICD-10-CM | POA: Diagnosis not present

## 2016-08-16 DIAGNOSIS — M25511 Pain in right shoulder: Secondary | ICD-10-CM | POA: Diagnosis not present

## 2016-08-16 DIAGNOSIS — Z9842 Cataract extraction status, left eye: Secondary | ICD-10-CM | POA: Diagnosis not present

## 2016-08-16 DIAGNOSIS — J439 Emphysema, unspecified: Secondary | ICD-10-CM | POA: Diagnosis not present

## 2016-08-16 DIAGNOSIS — J9610 Chronic respiratory failure, unspecified whether with hypoxia or hypercapnia: Secondary | ICD-10-CM | POA: Diagnosis not present

## 2016-08-16 DIAGNOSIS — M791 Myalgia: Secondary | ICD-10-CM | POA: Diagnosis not present

## 2016-08-16 DIAGNOSIS — M545 Low back pain: Secondary | ICD-10-CM | POA: Diagnosis not present

## 2016-08-16 DIAGNOSIS — H9190 Unspecified hearing loss, unspecified ear: Secondary | ICD-10-CM | POA: Diagnosis not present

## 2016-08-16 DIAGNOSIS — R079 Chest pain, unspecified: Secondary | ICD-10-CM | POA: Diagnosis not present

## 2016-08-16 DIAGNOSIS — Z961 Presence of intraocular lens: Secondary | ICD-10-CM | POA: Diagnosis not present

## 2016-08-19 ENCOUNTER — Other Ambulatory Visit: Payer: Self-pay

## 2016-08-19 MED ORDER — ALBUTEROL SULFATE (2.5 MG/3ML) 0.083% IN NEBU: 2.5000 mg | INHALATION_SOLUTION | RESPIRATORY_TRACT | 6 refills | Status: DC | PRN

## 2016-08-24 DIAGNOSIS — S32010A Wedge compression fracture of first lumbar vertebra, initial encounter for closed fracture: Secondary | ICD-10-CM | POA: Diagnosis not present

## 2016-08-24 DIAGNOSIS — M545 Low back pain: Secondary | ICD-10-CM | POA: Diagnosis not present

## 2016-08-24 DIAGNOSIS — M4856XA Collapsed vertebra, not elsewhere classified, lumbar region, initial encounter for fracture: Secondary | ICD-10-CM | POA: Diagnosis not present

## 2016-09-03 ENCOUNTER — Telehealth: Payer: Self-pay | Admitting: *Deleted

## 2016-09-03 ENCOUNTER — Encounter: Payer: Self-pay | Admitting: Interventional Radiology

## 2016-09-03 NOTE — Telephone Encounter (Signed)
I called patient to schedule f/u from Kyphoplasty and he states he is doing fine and doesn't want a f/u appt.Cathren Harsh

## 2016-09-07 ENCOUNTER — Ambulatory Visit: Payer: Medicare Other | Admitting: Family Medicine

## 2016-09-08 ENCOUNTER — Other Ambulatory Visit: Payer: Self-pay | Admitting: Family Medicine

## 2016-09-09 ENCOUNTER — Other Ambulatory Visit: Payer: Self-pay | Admitting: Family Medicine

## 2016-09-20 ENCOUNTER — Ambulatory Visit: Payer: Medicare Other | Admitting: Family Medicine

## 2016-09-30 DIAGNOSIS — M545 Low back pain: Secondary | ICD-10-CM | POA: Diagnosis not present

## 2016-09-30 DIAGNOSIS — E785 Hyperlipidemia, unspecified: Secondary | ICD-10-CM | POA: Diagnosis not present

## 2016-09-30 DIAGNOSIS — R05 Cough: Secondary | ICD-10-CM | POA: Diagnosis not present

## 2016-09-30 DIAGNOSIS — Z9981 Dependence on supplemental oxygen: Secondary | ICD-10-CM | POA: Diagnosis not present

## 2016-09-30 DIAGNOSIS — R0602 Shortness of breath: Secondary | ICD-10-CM | POA: Diagnosis not present

## 2016-09-30 DIAGNOSIS — R030 Elevated blood-pressure reading, without diagnosis of hypertension: Secondary | ICD-10-CM | POA: Diagnosis not present

## 2016-09-30 DIAGNOSIS — S32010A Wedge compression fracture of first lumbar vertebra, initial encounter for closed fracture: Secondary | ICD-10-CM | POA: Diagnosis not present

## 2016-09-30 DIAGNOSIS — F419 Anxiety disorder, unspecified: Secondary | ICD-10-CM | POA: Diagnosis not present

## 2016-09-30 DIAGNOSIS — J9611 Chronic respiratory failure with hypoxia: Secondary | ICD-10-CM | POA: Diagnosis not present

## 2016-09-30 DIAGNOSIS — R079 Chest pain, unspecified: Secondary | ICD-10-CM | POA: Diagnosis not present

## 2016-09-30 DIAGNOSIS — J449 Chronic obstructive pulmonary disease, unspecified: Secondary | ICD-10-CM | POA: Diagnosis not present

## 2016-09-30 DIAGNOSIS — J441 Chronic obstructive pulmonary disease with (acute) exacerbation: Secondary | ICD-10-CM | POA: Diagnosis not present

## 2016-09-30 DIAGNOSIS — E119 Type 2 diabetes mellitus without complications: Secondary | ICD-10-CM | POA: Diagnosis not present

## 2016-09-30 DIAGNOSIS — M549 Dorsalgia, unspecified: Secondary | ICD-10-CM | POA: Insufficient documentation

## 2016-09-30 DIAGNOSIS — J9610 Chronic respiratory failure, unspecified whether with hypoxia or hypercapnia: Secondary | ICD-10-CM | POA: Diagnosis not present

## 2016-10-01 DIAGNOSIS — Z9841 Cataract extraction status, right eye: Secondary | ICD-10-CM | POA: Diagnosis not present

## 2016-10-01 DIAGNOSIS — Z7984 Long term (current) use of oral hypoglycemic drugs: Secondary | ICD-10-CM | POA: Diagnosis not present

## 2016-10-01 DIAGNOSIS — Z7951 Long term (current) use of inhaled steroids: Secondary | ICD-10-CM | POA: Diagnosis not present

## 2016-10-01 DIAGNOSIS — K219 Gastro-esophageal reflux disease without esophagitis: Secondary | ICD-10-CM | POA: Diagnosis present

## 2016-10-01 DIAGNOSIS — R0602 Shortness of breath: Secondary | ICD-10-CM | POA: Diagnosis not present

## 2016-10-01 DIAGNOSIS — E785 Hyperlipidemia, unspecified: Secondary | ICD-10-CM | POA: Diagnosis present

## 2016-10-01 DIAGNOSIS — Z961 Presence of intraocular lens: Secondary | ICD-10-CM | POA: Diagnosis present

## 2016-10-01 DIAGNOSIS — H9190 Unspecified hearing loss, unspecified ear: Secondary | ICD-10-CM | POA: Diagnosis present

## 2016-10-01 DIAGNOSIS — Z87891 Personal history of nicotine dependence: Secondary | ICD-10-CM | POA: Diagnosis not present

## 2016-10-01 DIAGNOSIS — Z9981 Dependence on supplemental oxygen: Secondary | ICD-10-CM | POA: Diagnosis not present

## 2016-10-01 DIAGNOSIS — M549 Dorsalgia, unspecified: Secondary | ICD-10-CM | POA: Diagnosis present

## 2016-10-01 DIAGNOSIS — E119 Type 2 diabetes mellitus without complications: Secondary | ICD-10-CM | POA: Diagnosis present

## 2016-10-01 DIAGNOSIS — J9611 Chronic respiratory failure with hypoxia: Secondary | ICD-10-CM | POA: Diagnosis not present

## 2016-10-01 DIAGNOSIS — Z9842 Cataract extraction status, left eye: Secondary | ICD-10-CM | POA: Diagnosis not present

## 2016-10-01 DIAGNOSIS — E118 Type 2 diabetes mellitus with unspecified complications: Secondary | ICD-10-CM | POA: Diagnosis not present

## 2016-10-01 DIAGNOSIS — J441 Chronic obstructive pulmonary disease with (acute) exacerbation: Secondary | ICD-10-CM | POA: Diagnosis not present

## 2016-10-01 DIAGNOSIS — I10 Essential (primary) hypertension: Secondary | ICD-10-CM | POA: Diagnosis not present

## 2016-10-01 DIAGNOSIS — Z7982 Long term (current) use of aspirin: Secondary | ICD-10-CM | POA: Diagnosis not present

## 2016-10-01 DIAGNOSIS — F419 Anxiety disorder, unspecified: Secondary | ICD-10-CM | POA: Diagnosis present

## 2016-10-12 ENCOUNTER — Encounter: Payer: Self-pay | Admitting: Family Medicine

## 2016-10-12 ENCOUNTER — Ambulatory Visit (INDEPENDENT_AMBULATORY_CARE_PROVIDER_SITE_OTHER): Payer: Medicare Other | Admitting: Family Medicine

## 2016-10-12 VITALS — BP 141/62 | HR 73

## 2016-10-12 DIAGNOSIS — E119 Type 2 diabetes mellitus without complications: Secondary | ICD-10-CM

## 2016-10-12 DIAGNOSIS — M545 Low back pain, unspecified: Secondary | ICD-10-CM

## 2016-10-12 DIAGNOSIS — J441 Chronic obstructive pulmonary disease with (acute) exacerbation: Secondary | ICD-10-CM

## 2016-10-12 DIAGNOSIS — G8929 Other chronic pain: Secondary | ICD-10-CM | POA: Diagnosis not present

## 2016-10-12 MED ORDER — TIOTROPIUM BROMIDE MONOHYDRATE 18 MCG IN CAPS: 18.0000 ug | ORAL_CAPSULE | Freq: Every day | RESPIRATORY_TRACT | 12 refills | Status: DC

## 2016-10-12 MED ORDER — FUROSEMIDE 40 MG PO TABS: 40.0000 mg | ORAL_TABLET | Freq: Every day | ORAL | 0 refills | Status: DC | PRN

## 2016-10-12 MED ORDER — ALBUTEROL SULFATE (2.5 MG/3ML) 0.083% IN NEBU
2.5000 mg | INHALATION_SOLUTION | Freq: Once | RESPIRATORY_TRACT | Status: AC
  Administered 2016-10-12: 2.5 mg via RESPIRATORY_TRACT

## 2016-10-12 MED ORDER — PREDNISONE 5 MG PO TABS: 5.0000 mg | ORAL_TABLET | Freq: Every day | ORAL | 0 refills | Status: DC

## 2016-10-12 NOTE — Progress Notes (Signed)
Subjective:    CC: Hospital follow-up from [place] for COPD exacerbation.  HPI:  76 year old male with severe COPD was admitted to [facility] on the right first and discharged home on 3/5 with COPD exacerbation and chronic respiratory failure. Discharge on  10mg  prednisone and calcium with Vit.   He also complained of persistent back pain while in the hospital he had had a recent L1 kyphoplasty for fracture. They restarted his calcium with vitamin D. He does feel like his back pain has actually been getting better. He was really weak for about for 5 days right after the kyphoplasty and was barely able to walk but says it's been getting progressively better.  Diabetes-last him 11 A1c was 6.1 which is fantastic 2 months ago.  Past medical history, Surgical history, Family history not pertinant except as noted below, Social history, Allergies, and medications have been entered into the medical record, reviewed, and corrections made.   Review of Systems: No fevers, chills, night sweats, weight loss, chest pain, or shortness of breath.   Objective:    General: Well Developed, well nourished, and in no acute distress.  Neuro: Alert and oriented x3, extra-ocular muscles intact, sensation grossly intact.  HEENT: Normocephalic, atraumatic  Skin: Warm and dry, no rashes. Cardiac: Regular rate and rhythm, no murmurs rubs or gallops, no lower extremity edema.  Respiratory: Clear to auscultation bilaterally. Not using accessory muscles, speaking in full sentences.   Impression and Recommendations:   COPD - Ascending exacerbation with hospitalization. Undergo ahead and drop his prednisone down to 5 mg for maintenance. He can start it once he finishes what the hospital gave him for the taper. If he gets too high dose of prednisone it actually causes lower extremity swelling for him.  DM- Not due for A1c yet , see him back in about 6 weeks.  Lumbar back pain -  Improving. Work on gradual  increase in activity and working on some upper body exercises.

## 2016-10-14 ENCOUNTER — Inpatient Hospital Stay: Payer: Medicare Other | Admitting: Family Medicine

## 2016-10-16 ENCOUNTER — Other Ambulatory Visit: Payer: Self-pay | Admitting: Family Medicine

## 2016-10-19 ENCOUNTER — Ambulatory Visit: Payer: Medicare Other | Admitting: Family Medicine

## 2016-10-19 ENCOUNTER — Other Ambulatory Visit: Payer: Self-pay | Admitting: Family Medicine

## 2016-10-20 ENCOUNTER — Other Ambulatory Visit: Payer: Self-pay | Admitting: *Deleted

## 2016-10-20 MED ORDER — ALBUTEROL SULFATE (2.5 MG/3ML) 0.083% IN NEBU: 2.5000 mg | INHALATION_SOLUTION | RESPIRATORY_TRACT | 6 refills | Status: DC | PRN

## 2016-10-25 ENCOUNTER — Telehealth: Payer: Self-pay | Admitting: *Deleted

## 2016-10-25 NOTE — Telephone Encounter (Signed)
Form being faxed to the office

## 2016-11-02 ENCOUNTER — Ambulatory Visit: Payer: Medicare Other | Admitting: Family Medicine

## 2016-11-02 MED ORDER — PANTOPRAZOLE SODIUM 40 MG PO TBEC: 40.0000 mg | DELAYED_RELEASE_TABLET | Freq: Every day | ORAL | 3 refills | Status: DC

## 2016-11-02 NOTE — Telephone Encounter (Signed)
Call pt: Nexium will not be covered this year on his insurance. We can try changing to pantoprazole instead.  Beatrice Lecher, MD

## 2016-11-03 NOTE — Telephone Encounter (Signed)
Pt advised.Dayvin Aber Lyn[place]  

## 2016-11-04 ENCOUNTER — Telehealth: Payer: Self-pay | Admitting: Family Medicine

## 2016-11-04 NOTE — Telephone Encounter (Signed)
Received prior authorization request for Spiriva Handihlr I called insurance company and spoke with [place]. Medication is authorized from 08/30/16 - 11/04/17 Auth # [id number]. I called pharmacy they ran medicine and it will be available for pick up today. I called patient to let them know. - CF

## 2016-11-14 DIAGNOSIS — Z7984 Long term (current) use of oral hypoglycemic drugs: Secondary | ICD-10-CM | POA: Diagnosis not present

## 2016-11-14 DIAGNOSIS — J168 Pneumonia due to other specified infectious organisms: Secondary | ICD-10-CM | POA: Diagnosis not present

## 2016-11-14 DIAGNOSIS — R001 Bradycardia, unspecified: Secondary | ICD-10-CM | POA: Diagnosis not present

## 2016-11-14 DIAGNOSIS — E118 Type 2 diabetes mellitus with unspecified complications: Secondary | ICD-10-CM | POA: Diagnosis not present

## 2016-11-14 DIAGNOSIS — J439 Emphysema, unspecified: Secondary | ICD-10-CM | POA: Diagnosis not present

## 2016-11-14 DIAGNOSIS — I493 Ventricular premature depolarization: Secondary | ICD-10-CM | POA: Diagnosis present

## 2016-11-14 DIAGNOSIS — N281 Cyst of kidney, acquired: Secondary | ICD-10-CM | POA: Diagnosis not present

## 2016-11-14 DIAGNOSIS — E119 Type 2 diabetes mellitus without complications: Secondary | ICD-10-CM | POA: Diagnosis not present

## 2016-11-14 DIAGNOSIS — R918 Other nonspecific abnormal finding of lung field: Secondary | ICD-10-CM | POA: Diagnosis not present

## 2016-11-14 DIAGNOSIS — H9190 Unspecified hearing loss, unspecified ear: Secondary | ICD-10-CM | POA: Diagnosis present

## 2016-11-14 DIAGNOSIS — Z9981 Dependence on supplemental oxygen: Secondary | ICD-10-CM | POA: Diagnosis not present

## 2016-11-14 DIAGNOSIS — J9610 Chronic respiratory failure, unspecified whether with hypoxia or hypercapnia: Secondary | ICD-10-CM | POA: Diagnosis not present

## 2016-11-14 DIAGNOSIS — R05 Cough: Secondary | ICD-10-CM | POA: Diagnosis not present

## 2016-11-14 DIAGNOSIS — E785 Hyperlipidemia, unspecified: Secondary | ICD-10-CM | POA: Diagnosis present

## 2016-11-14 DIAGNOSIS — Z7951 Long term (current) use of inhaled steroids: Secondary | ICD-10-CM | POA: Diagnosis not present

## 2016-11-14 DIAGNOSIS — Z7982 Long term (current) use of aspirin: Secondary | ICD-10-CM | POA: Diagnosis not present

## 2016-11-14 DIAGNOSIS — F17211 Nicotine dependence, cigarettes, in remission: Secondary | ICD-10-CM | POA: Diagnosis not present

## 2016-11-14 DIAGNOSIS — D696 Thrombocytopenia, unspecified: Secondary | ICD-10-CM | POA: Diagnosis not present

## 2016-11-14 DIAGNOSIS — I10 Essential (primary) hypertension: Secondary | ICD-10-CM | POA: Diagnosis present

## 2016-11-14 DIAGNOSIS — J449 Chronic obstructive pulmonary disease, unspecified: Secondary | ICD-10-CM | POA: Diagnosis not present

## 2016-11-14 DIAGNOSIS — R000 Tachycardia, unspecified: Secondary | ICD-10-CM | POA: Diagnosis not present

## 2016-11-14 DIAGNOSIS — I358 Other nonrheumatic aortic valve disorders: Secondary | ICD-10-CM | POA: Diagnosis not present

## 2016-11-14 DIAGNOSIS — J189 Pneumonia, unspecified organism: Secondary | ICD-10-CM | POA: Diagnosis not present

## 2016-11-14 DIAGNOSIS — Z87891 Personal history of nicotine dependence: Secondary | ICD-10-CM | POA: Diagnosis not present

## 2016-11-14 DIAGNOSIS — F419 Anxiety disorder, unspecified: Secondary | ICD-10-CM | POA: Diagnosis present

## 2016-11-14 DIAGNOSIS — Z8249 Family history of ischemic heart disease and other diseases of the circulatory system: Secondary | ICD-10-CM | POA: Diagnosis not present

## 2016-11-14 DIAGNOSIS — J9611 Chronic respiratory failure with hypoxia: Secondary | ICD-10-CM | POA: Diagnosis present

## 2016-11-14 DIAGNOSIS — J181 Lobar pneumonia, unspecified organism: Secondary | ICD-10-CM | POA: Diagnosis not present

## 2016-11-14 DIAGNOSIS — K219 Gastro-esophageal reflux disease without esophagitis: Secondary | ICD-10-CM | POA: Diagnosis present

## 2016-11-14 DIAGNOSIS — J441 Chronic obstructive pulmonary disease with (acute) exacerbation: Secondary | ICD-10-CM | POA: Diagnosis not present

## 2016-11-14 DIAGNOSIS — R9431 Abnormal electrocardiogram [ECG] [EKG]: Secondary | ICD-10-CM | POA: Diagnosis not present

## 2016-11-14 DIAGNOSIS — R0602 Shortness of breath: Secondary | ICD-10-CM | POA: Diagnosis not present

## 2016-11-14 DIAGNOSIS — J440 Chronic obstructive pulmonary disease with acute lower respiratory infection: Secondary | ICD-10-CM | POA: Diagnosis present

## 2016-11-14 DIAGNOSIS — I700 Atherosclerosis of aorta: Secondary | ICD-10-CM | POA: Diagnosis present

## 2016-11-14 DIAGNOSIS — Z79899 Other long term (current) drug therapy: Secondary | ICD-10-CM | POA: Diagnosis not present

## 2016-11-20 ENCOUNTER — Other Ambulatory Visit: Payer: Self-pay | Admitting: Family Medicine

## 2016-11-24 ENCOUNTER — Inpatient Hospital Stay: Payer: Medicare Other | Admitting: Family Medicine

## 2016-11-25 ENCOUNTER — Ambulatory Visit: Payer: Medicare Other

## 2016-11-25 ENCOUNTER — Ambulatory Visit (INDEPENDENT_AMBULATORY_CARE_PROVIDER_SITE_OTHER): Payer: Medicare Other | Admitting: Family Medicine

## 2016-11-25 ENCOUNTER — Encounter: Payer: Self-pay | Admitting: Family Medicine

## 2016-11-25 VITALS — BP 135/73 | HR 73 | Ht 71.0 in | Wt 136.0 lb

## 2016-11-25 DIAGNOSIS — E119 Type 2 diabetes mellitus without complications: Secondary | ICD-10-CM | POA: Diagnosis not present

## 2016-11-25 DIAGNOSIS — E099 Drug or chemical induced diabetes mellitus without complications: Secondary | ICD-10-CM | POA: Diagnosis not present

## 2016-11-25 DIAGNOSIS — J189 Pneumonia, unspecified organism: Secondary | ICD-10-CM | POA: Diagnosis not present

## 2016-11-25 DIAGNOSIS — I10 Essential (primary) hypertension: Secondary | ICD-10-CM | POA: Diagnosis not present

## 2016-11-25 DIAGNOSIS — N281 Cyst of kidney, acquired: Secondary | ICD-10-CM

## 2016-11-25 DIAGNOSIS — J441 Chronic obstructive pulmonary disease with (acute) exacerbation: Secondary | ICD-10-CM | POA: Diagnosis not present

## 2016-11-25 LAB — POCT GLYCOSYLATED HEMOGLOBIN (HGB A1C): Hemoglobin A1C: 6.2

## 2016-11-25 MED ORDER — ALBUTEROL SULFATE (2.5 MG/3ML) 0.083% IN NEBU
2.5000 mg | INHALATION_SOLUTION | Freq: Once | RESPIRATORY_TRACT | Status: AC
  Administered 2016-11-25: 2.5 mg via RESPIRATORY_TRACT

## 2016-11-25 NOTE — Progress Notes (Signed)
Subjective:    CC: DM   HPI:  Diabetes - no hypoglycemic events. No wounds or sores that are not healing well. No increased thirst or urination. Checking glucose at home. Taking medications as prescribed without any side effects.  Hypertension- Pt denies chest pain, SOB, dizziness, or heart palpitations.  Taking meds as directed w/o problems.  Denies medication side effects.    COPD - breathing is "fair" today.  He is getting some gray sputum.  No fever, chills or increased work of breathing.  Reports using his inhalers regulary. On chronic 2 liters.   He was admitted to [facility] on March 18 for pneumonia and discharged home on March 23. They did discover a left renal cyst and had recommended follow-up ultrasound in about 3 months. His son-in-law who is with him today says that he picked up the cefuroxime antibiotic that we had called in about 2 days ago and he has Artie started that. He is also on prednisone 10 mg daily. He tends to get swelling if he takes higher doses than that. He's not had a fever.  Past medical history, Surgical history, Family history not pertinant except as noted below, Social history, Allergies, and medications have been entered into the medical record, reviewed, and corrections made.   Review of Systems: No fevers, chills, night sweats, weight loss, chest pain, or shortness of breath.   Objective:    General: Well Developed, well nourished, and in no acute distress.  Neuro: Alert and oriented x3, extra-ocular muscles intact, sensation grossly intact.  HEENT: Normocephalic, atraumatic  Skin: Warm and dry, no rashes. Cardiac: Regular rate and rhythm, no murmurs rubs or gallops, no lower extremity edema.  Respiratory: Clear to auscultation bilaterally. Not using accessory muscles, speaking in full sentences.   Impression and Recommendations:    DM- Well controlled. Continue current regimen. Follow up in  3-4 months.  A1 c of 6.2 today.    Left renal  cysts-we'll repeat sonogram in 3 months.  COPD - Stable, but poor at best.  Has frequent hospital admissions. Given neb tx in office today.  On 10 prednisone and just start cefuroxime 10 days ago.    PNA-  resolved.    COPD-

## 2016-12-03 ENCOUNTER — Telehealth: Payer: Self-pay | Admitting: *Deleted

## 2016-12-03 NOTE — Telephone Encounter (Signed)
[id number]

## 2016-12-07 ENCOUNTER — Other Ambulatory Visit: Payer: Self-pay | Admitting: Family Medicine

## 2016-12-10 NOTE — Telephone Encounter (Signed)
nexium has been approved through insurance from 09/04/2016-12/03/2017. Left message on vm.

## 2016-12-19 ENCOUNTER — Other Ambulatory Visit: Payer: Self-pay | Admitting: Family Medicine

## 2016-12-20 ENCOUNTER — Other Ambulatory Visit: Payer: Self-pay

## 2016-12-20 MED ORDER — PRAVASTATIN SODIUM 20 MG PO TABS: 20.0000 mg | ORAL_TABLET | Freq: Every day | ORAL | 11 refills | Status: DC

## 2016-12-20 NOTE — Telephone Encounter (Signed)
Patient request refill for Pravastatin 20 mg. #[phone] refills sent to [facility]. Rhonda Cunningham,CMA

## 2016-12-21 DIAGNOSIS — R05 Cough: Secondary | ICD-10-CM | POA: Diagnosis not present

## 2016-12-21 DIAGNOSIS — I10 Essential (primary) hypertension: Secondary | ICD-10-CM | POA: Diagnosis not present

## 2016-12-21 DIAGNOSIS — E119 Type 2 diabetes mellitus without complications: Secondary | ICD-10-CM | POA: Diagnosis not present

## 2016-12-21 DIAGNOSIS — R0602 Shortness of breath: Secondary | ICD-10-CM | POA: Diagnosis not present

## 2016-12-21 DIAGNOSIS — Z87891 Personal history of nicotine dependence: Secondary | ICD-10-CM | POA: Diagnosis not present

## 2016-12-21 DIAGNOSIS — J449 Chronic obstructive pulmonary disease, unspecified: Secondary | ICD-10-CM | POA: Diagnosis not present

## 2016-12-21 DIAGNOSIS — Z8249 Family history of ischemic heart disease and other diseases of the circulatory system: Secondary | ICD-10-CM | POA: Diagnosis not present

## 2016-12-21 DIAGNOSIS — J9610 Chronic respiratory failure, unspecified whether with hypoxia or hypercapnia: Secondary | ICD-10-CM | POA: Diagnosis not present

## 2016-12-21 DIAGNOSIS — H9190 Unspecified hearing loss, unspecified ear: Secondary | ICD-10-CM | POA: Diagnosis not present

## 2016-12-21 DIAGNOSIS — H9222 Otorrhagia, left ear: Secondary | ICD-10-CM | POA: Diagnosis not present

## 2016-12-21 DIAGNOSIS — K219 Gastro-esophageal reflux disease without esophagitis: Secondary | ICD-10-CM | POA: Diagnosis not present

## 2016-12-22 ENCOUNTER — Other Ambulatory Visit: Payer: Self-pay | Admitting: Family Medicine

## 2017-01-05 ENCOUNTER — Other Ambulatory Visit: Payer: Self-pay | Admitting: *Deleted

## 2017-01-05 MED ORDER — PRAVASTATIN SODIUM 20 MG PO TABS: 20.0000 mg | ORAL_TABLET | Freq: Every day | ORAL | 4 refills | Status: DC

## 2017-01-06 ENCOUNTER — Other Ambulatory Visit: Payer: Self-pay | Admitting: *Deleted

## 2017-01-06 MED ORDER — CEFUROXIME AXETIL 250 MG PO TABS: 250.0000 mg | ORAL_TABLET | Freq: Two times a day (BID) | ORAL | 0 refills | Status: DC

## 2017-01-08 DIAGNOSIS — I10 Essential (primary) hypertension: Secondary | ICD-10-CM | POA: Diagnosis not present

## 2017-01-08 DIAGNOSIS — E118 Type 2 diabetes mellitus with unspecified complications: Secondary | ICD-10-CM | POA: Diagnosis not present

## 2017-01-08 DIAGNOSIS — Z7951 Long term (current) use of inhaled steroids: Secondary | ICD-10-CM | POA: Diagnosis not present

## 2017-01-08 DIAGNOSIS — R0602 Shortness of breath: Secondary | ICD-10-CM | POA: Diagnosis not present

## 2017-01-08 DIAGNOSIS — E119 Type 2 diabetes mellitus without complications: Secondary | ICD-10-CM | POA: Diagnosis not present

## 2017-01-08 DIAGNOSIS — J9611 Chronic respiratory failure with hypoxia: Secondary | ICD-10-CM | POA: Diagnosis not present

## 2017-01-08 DIAGNOSIS — Z87891 Personal history of nicotine dependence: Secondary | ICD-10-CM | POA: Diagnosis not present

## 2017-01-08 DIAGNOSIS — R069 Unspecified abnormalities of breathing: Secondary | ICD-10-CM | POA: Diagnosis not present

## 2017-01-08 DIAGNOSIS — Z7982 Long term (current) use of aspirin: Secondary | ICD-10-CM | POA: Diagnosis not present

## 2017-01-08 DIAGNOSIS — J9621 Acute and chronic respiratory failure with hypoxia: Secondary | ICD-10-CM | POA: Diagnosis not present

## 2017-01-08 DIAGNOSIS — E785 Hyperlipidemia, unspecified: Secondary | ICD-10-CM | POA: Diagnosis not present

## 2017-01-08 DIAGNOSIS — J441 Chronic obstructive pulmonary disease with (acute) exacerbation: Secondary | ICD-10-CM | POA: Diagnosis not present

## 2017-01-08 DIAGNOSIS — H9190 Unspecified hearing loss, unspecified ear: Secondary | ICD-10-CM | POA: Diagnosis present

## 2017-01-08 DIAGNOSIS — Z79899 Other long term (current) drug therapy: Secondary | ICD-10-CM | POA: Diagnosis not present

## 2017-01-08 DIAGNOSIS — F419 Anxiety disorder, unspecified: Secondary | ICD-10-CM | POA: Diagnosis present

## 2017-01-08 DIAGNOSIS — K219 Gastro-esophageal reflux disease without esophagitis: Secondary | ICD-10-CM | POA: Diagnosis present

## 2017-01-17 ENCOUNTER — Inpatient Hospital Stay: Payer: Medicare Other | Admitting: Family Medicine

## 2017-01-27 DIAGNOSIS — R0602 Shortness of breath: Secondary | ICD-10-CM | POA: Diagnosis not present

## 2017-01-27 DIAGNOSIS — R093 Abnormal sputum: Secondary | ICD-10-CM | POA: Diagnosis not present

## 2017-01-27 DIAGNOSIS — R05 Cough: Secondary | ICD-10-CM | POA: Diagnosis not present

## 2017-01-27 DIAGNOSIS — J441 Chronic obstructive pulmonary disease with (acute) exacerbation: Secondary | ICD-10-CM | POA: Diagnosis not present

## 2017-01-27 DIAGNOSIS — I10 Essential (primary) hypertension: Secondary | ICD-10-CM | POA: Diagnosis not present

## 2017-01-27 DIAGNOSIS — R079 Chest pain, unspecified: Secondary | ICD-10-CM | POA: Diagnosis not present

## 2017-01-27 DIAGNOSIS — Z8249 Family history of ischemic heart disease and other diseases of the circulatory system: Secondary | ICD-10-CM | POA: Diagnosis not present

## 2017-01-27 DIAGNOSIS — J9610 Chronic respiratory failure, unspecified whether with hypoxia or hypercapnia: Secondary | ICD-10-CM | POA: Diagnosis not present

## 2017-01-27 DIAGNOSIS — H9190 Unspecified hearing loss, unspecified ear: Secondary | ICD-10-CM | POA: Diagnosis not present

## 2017-01-27 DIAGNOSIS — R071 Chest pain on breathing: Secondary | ICD-10-CM | POA: Diagnosis not present

## 2017-01-27 DIAGNOSIS — Z7982 Long term (current) use of aspirin: Secondary | ICD-10-CM | POA: Diagnosis not present

## 2017-01-27 DIAGNOSIS — Z7951 Long term (current) use of inhaled steroids: Secondary | ICD-10-CM | POA: Diagnosis not present

## 2017-01-27 DIAGNOSIS — Z87891 Personal history of nicotine dependence: Secondary | ICD-10-CM | POA: Diagnosis not present

## 2017-01-27 DIAGNOSIS — E119 Type 2 diabetes mellitus without complications: Secondary | ICD-10-CM | POA: Diagnosis not present

## 2017-01-27 DIAGNOSIS — F419 Anxiety disorder, unspecified: Secondary | ICD-10-CM | POA: Diagnosis not present

## 2017-01-27 DIAGNOSIS — E785 Hyperlipidemia, unspecified: Secondary | ICD-10-CM | POA: Diagnosis not present

## 2017-02-05 DIAGNOSIS — K219 Gastro-esophageal reflux disease without esophagitis: Secondary | ICD-10-CM | POA: Diagnosis not present

## 2017-02-05 DIAGNOSIS — E119 Type 2 diabetes mellitus without complications: Secondary | ICD-10-CM | POA: Diagnosis not present

## 2017-02-05 DIAGNOSIS — J9611 Chronic respiratory failure with hypoxia: Secondary | ICD-10-CM | POA: Diagnosis present

## 2017-02-05 DIAGNOSIS — R402252 Coma scale, best verbal response, oriented, at arrival to emergency department: Secondary | ICD-10-CM | POA: Diagnosis present

## 2017-02-05 DIAGNOSIS — H9190 Unspecified hearing loss, unspecified ear: Secondary | ICD-10-CM | POA: Diagnosis present

## 2017-02-05 DIAGNOSIS — I10 Essential (primary) hypertension: Secondary | ICD-10-CM | POA: Diagnosis not present

## 2017-02-05 DIAGNOSIS — R0603 Acute respiratory distress: Secondary | ICD-10-CM | POA: Diagnosis not present

## 2017-02-05 DIAGNOSIS — J441 Chronic obstructive pulmonary disease with (acute) exacerbation: Secondary | ICD-10-CM | POA: Diagnosis not present

## 2017-02-05 DIAGNOSIS — Z7982 Long term (current) use of aspirin: Secondary | ICD-10-CM | POA: Diagnosis not present

## 2017-02-05 DIAGNOSIS — R069 Unspecified abnormalities of breathing: Secondary | ICD-10-CM | POA: Diagnosis not present

## 2017-02-05 DIAGNOSIS — F419 Anxiety disorder, unspecified: Secondary | ICD-10-CM | POA: Diagnosis not present

## 2017-02-05 DIAGNOSIS — R0682 Tachypnea, not elsewhere classified: Secondary | ICD-10-CM | POA: Diagnosis not present

## 2017-02-05 DIAGNOSIS — R402142 Coma scale, eyes open, spontaneous, at arrival to emergency department: Secondary | ICD-10-CM | POA: Diagnosis present

## 2017-02-05 DIAGNOSIS — J9811 Atelectasis: Secondary | ICD-10-CM | POA: Diagnosis not present

## 2017-02-05 DIAGNOSIS — R402362 Coma scale, best motor response, obeys commands, at arrival to emergency department: Secondary | ICD-10-CM | POA: Diagnosis present

## 2017-02-05 DIAGNOSIS — E785 Hyperlipidemia, unspecified: Secondary | ICD-10-CM | POA: Diagnosis not present

## 2017-02-05 DIAGNOSIS — J439 Emphysema, unspecified: Secondary | ICD-10-CM | POA: Diagnosis not present

## 2017-02-05 DIAGNOSIS — I700 Atherosclerosis of aorta: Secondary | ICD-10-CM | POA: Diagnosis not present

## 2017-02-05 DIAGNOSIS — Z7951 Long term (current) use of inhaled steroids: Secondary | ICD-10-CM | POA: Diagnosis not present

## 2017-02-10 ENCOUNTER — Inpatient Hospital Stay: Payer: Medicare Other | Admitting: Family Medicine

## 2017-02-10 ENCOUNTER — Other Ambulatory Visit: Payer: Self-pay | Admitting: Family Medicine

## 2017-02-11 ENCOUNTER — Other Ambulatory Visit: Payer: Self-pay | Admitting: Family Medicine

## 2017-02-11 ENCOUNTER — Inpatient Hospital Stay: Payer: Medicare Other | Admitting: Family Medicine

## 2017-02-25 ENCOUNTER — Other Ambulatory Visit: Payer: Self-pay | Admitting: Family Medicine

## 2017-02-25 ENCOUNTER — Ambulatory Visit: Payer: Medicare Other | Admitting: Family Medicine

## 2017-03-01 DIAGNOSIS — J441 Chronic obstructive pulmonary disease with (acute) exacerbation: Secondary | ICD-10-CM | POA: Diagnosis not present

## 2017-03-01 DIAGNOSIS — Z7984 Long term (current) use of oral hypoglycemic drugs: Secondary | ICD-10-CM | POA: Diagnosis not present

## 2017-03-01 DIAGNOSIS — Z87891 Personal history of nicotine dependence: Secondary | ICD-10-CM | POA: Diagnosis not present

## 2017-03-01 DIAGNOSIS — I471 Supraventricular tachycardia: Secondary | ICD-10-CM | POA: Diagnosis not present

## 2017-03-01 DIAGNOSIS — J9621 Acute and chronic respiratory failure with hypoxia: Secondary | ICD-10-CM | POA: Diagnosis not present

## 2017-03-01 DIAGNOSIS — R069 Unspecified abnormalities of breathing: Secondary | ICD-10-CM | POA: Diagnosis not present

## 2017-03-01 DIAGNOSIS — K219 Gastro-esophageal reflux disease without esophagitis: Secondary | ICD-10-CM | POA: Diagnosis not present

## 2017-03-01 DIAGNOSIS — I10 Essential (primary) hypertension: Secondary | ICD-10-CM | POA: Diagnosis not present

## 2017-03-01 DIAGNOSIS — R0602 Shortness of breath: Secondary | ICD-10-CM | POA: Diagnosis not present

## 2017-03-01 DIAGNOSIS — Z7982 Long term (current) use of aspirin: Secondary | ICD-10-CM | POA: Diagnosis not present

## 2017-03-01 DIAGNOSIS — R05 Cough: Secondary | ICD-10-CM | POA: Diagnosis not present

## 2017-03-01 DIAGNOSIS — Z7951 Long term (current) use of inhaled steroids: Secondary | ICD-10-CM | POA: Diagnosis not present

## 2017-03-01 DIAGNOSIS — E1165 Type 2 diabetes mellitus with hyperglycemia: Secondary | ICD-10-CM | POA: Diagnosis not present

## 2017-03-01 DIAGNOSIS — F419 Anxiety disorder, unspecified: Secondary | ICD-10-CM | POA: Diagnosis present

## 2017-03-01 DIAGNOSIS — R197 Diarrhea, unspecified: Secondary | ICD-10-CM | POA: Diagnosis not present

## 2017-03-01 DIAGNOSIS — I493 Ventricular premature depolarization: Secondary | ICD-10-CM | POA: Diagnosis present

## 2017-03-01 DIAGNOSIS — E785 Hyperlipidemia, unspecified: Secondary | ICD-10-CM | POA: Diagnosis present

## 2017-03-01 DIAGNOSIS — J9611 Chronic respiratory failure with hypoxia: Secondary | ICD-10-CM | POA: Diagnosis not present

## 2017-03-01 DIAGNOSIS — R5381 Other malaise: Secondary | ICD-10-CM | POA: Diagnosis not present

## 2017-03-05 ENCOUNTER — Other Ambulatory Visit: Payer: Self-pay | Admitting: Family Medicine

## 2017-03-07 NOTE — Telephone Encounter (Signed)
Chart shows pt has had this medication filled routinely. Is this appropriate since pt was recently discharged from medical ward. Please advise.

## 2017-03-08 ENCOUNTER — Ambulatory Visit: Payer: Medicare Other | Admitting: Family Medicine

## 2017-04-08 ENCOUNTER — Other Ambulatory Visit: Payer: Self-pay

## 2017-04-08 ENCOUNTER — Other Ambulatory Visit: Payer: Self-pay | Admitting: Family Medicine

## 2017-04-08 MED ORDER — VENTOLIN HFA 108 (90 BASE) MCG/ACT IN AERS
2.0000 | INHALATION_SPRAY | Freq: Four times a day (QID) | RESPIRATORY_TRACT | 0 refills | Status: DC | PRN
Start: 2017-04-08 — End: 2017-11-22

## 2017-04-08 NOTE — Progress Notes (Signed)
Pt advised he needs an appointment. Will have his son-in-law call back to schedule.

## 2017-04-14 DIAGNOSIS — J441 Chronic obstructive pulmonary disease with (acute) exacerbation: Secondary | ICD-10-CM | POA: Diagnosis not present

## 2017-04-14 DIAGNOSIS — R0602 Shortness of breath: Secondary | ICD-10-CM | POA: Diagnosis not present

## 2017-04-14 DIAGNOSIS — Z87891 Personal history of nicotine dependence: Secondary | ICD-10-CM | POA: Diagnosis not present

## 2017-04-14 DIAGNOSIS — F419 Anxiety disorder, unspecified: Secondary | ICD-10-CM | POA: Diagnosis present

## 2017-04-14 DIAGNOSIS — E785 Hyperlipidemia, unspecified: Secondary | ICD-10-CM | POA: Diagnosis present

## 2017-04-14 DIAGNOSIS — Z7982 Long term (current) use of aspirin: Secondary | ICD-10-CM | POA: Diagnosis not present

## 2017-04-14 DIAGNOSIS — N179 Acute kidney failure, unspecified: Secondary | ICD-10-CM | POA: Diagnosis not present

## 2017-04-14 DIAGNOSIS — E875 Hyperkalemia: Secondary | ICD-10-CM | POA: Diagnosis not present

## 2017-04-14 DIAGNOSIS — K219 Gastro-esophageal reflux disease without esophagitis: Secondary | ICD-10-CM | POA: Diagnosis present

## 2017-04-14 DIAGNOSIS — H9190 Unspecified hearing loss, unspecified ear: Secondary | ICD-10-CM | POA: Diagnosis present

## 2017-04-14 DIAGNOSIS — D696 Thrombocytopenia, unspecified: Secondary | ICD-10-CM | POA: Diagnosis not present

## 2017-04-14 DIAGNOSIS — I10 Essential (primary) hypertension: Secondary | ICD-10-CM | POA: Diagnosis not present

## 2017-04-14 DIAGNOSIS — Z7951 Long term (current) use of inhaled steroids: Secondary | ICD-10-CM | POA: Diagnosis not present

## 2017-04-14 DIAGNOSIS — R000 Tachycardia, unspecified: Secondary | ICD-10-CM | POA: Diagnosis not present

## 2017-04-14 DIAGNOSIS — N289 Disorder of kidney and ureter, unspecified: Secondary | ICD-10-CM | POA: Diagnosis not present

## 2017-04-14 DIAGNOSIS — Z79899 Other long term (current) drug therapy: Secondary | ICD-10-CM | POA: Diagnosis not present

## 2017-04-14 DIAGNOSIS — J9610 Chronic respiratory failure, unspecified whether with hypoxia or hypercapnia: Secondary | ICD-10-CM | POA: Diagnosis not present

## 2017-04-14 DIAGNOSIS — E119 Type 2 diabetes mellitus without complications: Secondary | ICD-10-CM | POA: Diagnosis not present

## 2017-04-18 DIAGNOSIS — E875 Hyperkalemia: Secondary | ICD-10-CM | POA: Insufficient documentation

## 2017-04-22 ENCOUNTER — Encounter: Payer: Self-pay | Admitting: Family Medicine

## 2017-04-22 ENCOUNTER — Other Ambulatory Visit: Payer: Self-pay | Admitting: Family Medicine

## 2017-04-22 ENCOUNTER — Ambulatory Visit (INDEPENDENT_AMBULATORY_CARE_PROVIDER_SITE_OTHER): Payer: Medicare Other | Admitting: Family Medicine

## 2017-04-22 VITALS — BP 122/65 | HR 88 | Wt 133.0 lb

## 2017-04-22 DIAGNOSIS — E099 Drug or chemical induced diabetes mellitus without complications: Secondary | ICD-10-CM | POA: Diagnosis not present

## 2017-04-22 DIAGNOSIS — N289 Disorder of kidney and ureter, unspecified: Secondary | ICD-10-CM | POA: Diagnosis not present

## 2017-04-22 DIAGNOSIS — N2889 Other specified disorders of kidney and ureter: Secondary | ICD-10-CM | POA: Diagnosis not present

## 2017-04-22 DIAGNOSIS — E875 Hyperkalemia: Secondary | ICD-10-CM | POA: Diagnosis not present

## 2017-04-22 DIAGNOSIS — J441 Chronic obstructive pulmonary disease with (acute) exacerbation: Secondary | ICD-10-CM

## 2017-04-22 LAB — POCT GLYCOSYLATED HEMOGLOBIN (HGB A1C): HEMOGLOBIN A1C: 5.7

## 2017-04-22 MED ORDER — ALBUTEROL SULFATE (2.5 MG/3ML) 0.083% IN NEBU
2.5000 mg | INHALATION_SOLUTION | Freq: Once | RESPIRATORY_TRACT | Status: AC
  Administered 2017-04-22: 2.5 mg via RESPIRATORY_TRACT

## 2017-04-22 NOTE — Progress Notes (Signed)
Subjective:    Patient ID: Jared Lee, male    DOB: 1941/05/27, 76 y.o.   MRN: [id number]  HPI Jared Lee is a 76 year old male with severe COPD requiring oxygen he was recently hospitalized at [facility] on August 16th for COPD exacerbation.he was discharged home 5 days later on August 21. He was discharged home on a methylprednisolone taper. He was not given antibiotics and see has a history of recurrent C. Difficile. He did have an episode of mild hyperkalemia which was corrected with Kayexalate and resumption of his Lasix. They did recommend rechecking potassium levels.   Diabetes - no hypoglycemic events. No wounds or sores that are not healing well. No increased thirst or urination. Taking medications as prescribed without any side effects.   Review of Systems   BP 122/65   Pulse 88   Wt 133 lb (60.3 kg)   SpO2 93% Comment: 2.5L  BMI 18.55 kg/m     Allergies  Allergen Reactions  . Augmentin [Amoxicillin-Pot Clavulanate] Nausea And Vomiting    Stomach pain as well.   . Azithromycin Diarrhea    severe  . Daliresp [Roflumilast] Other (See Comments)    HA, diarrhea   . Levaquin [Levofloxacin]   . Prednisone Other (See Comments)    Swelling    Past Medical History:  Diagnosis Date  . COPD (chronic obstructive pulmonary disease) ([place])   . H/O colonoscopy 02/03/12   EGD as well. Mildy nodular gastritis.    . Hyperlipemia   . Hypertension   . Normal cardiac stress test 04/14/12   High POint REgional    Past Surgical History:  Procedure Laterality Date  . CATARACT EXTRACTION Right 03/15/2016  . CATARACT EXTRACTION W/ INTRAOCULAR LENS IMPLANT Left 04/05/2016  . IR GENERIC HISTORICAL  08/04/2016   IR RADIOLOGIST EVAL & MGMT 08/04/2016 [name], MD [facility] INTERV RAD    Social History   Social History  . Marital status: Single    Spouse name: N/A  . Number of children: N/A  . Years of education: N/A   Occupational History  . Not on file.    Social History Main Topics  . Smoking status: Former Smoker    Packs/day: 1.00    Years: 66.00    Types: Cigarettes    Quit date: 08/31/2011  . Smokeless tobacco: Never Used  . Alcohol use No  . Drug use: No  . Sexual activity: Not on file   Other Topics Concern  . Not on file   Social History Narrative  . No narrative on file    Family History  Problem Relation Age of Onset  . Heart disease Father   . Hypertension Father   . Emphysema Father   . Emphysema Paternal Grandfather     Outpatient Encounter Prescriptions as of 04/22/2017  Medication Sig  . predniSONE (DELTASONE) 10 MG tablet Take 10 mg by mouth.  Marland Kitchen albuterol (PROVENTIL) (2.5 MG/3ML) 0.083% nebulizer solution Take 3 mLs (2.5 mg total) by nebulization every 4 (four) hours as needed for wheezing or shortness of breath. ICD 10 J44.1 COPD  . ALPRAZolam (XANAX) 0.25 MG tablet Take 0.25 mg by mouth.  . AMBULATORY NON FORMULARY MEDICATION Medication Name: Ambulatory oxygen cocentrator.  Pt is mobile within th home.  Pt requests portable concentrator. Using [facility] for oxygen needs.  . AMBULATORY NON FORMULARY MEDICATION Medication Name: One Touch Ultra Test Strips for BID Testing Daily. Dx Code: 250.00  Dx: Type II or unspecified type  diabetes mellitus without mention of complication, not stated as uncontrolled (250.00)  . aspirin 81 MG tablet Take 81 mg by mouth daily.  . Blood Glucose Monitoring Suppl (BLOOD GLUCOSE MONITOR SYSTEM) W/DEVICE KIT Check twice a day.  . budesonide-formoterol (SYMBICORT) 160-4.5 MCG/ACT inhaler Inhale 2 puffs into the lungs 2 (two) times daily.  . Calcium Carb-Cholecalciferol (CALCIUM-VITAMIN D) 500-200 MG-UNIT tablet Take 1 tablet by mouth daily.  . colestipol (COLESTID) 1 g tablet TAKE ONE TABLET BY MOUTH TWICE DAILY AS NEEDED FOR DIARRHEA  . ferrous sulfate 325 (65 FE) MG tablet TAKE ONE TABLET BY MOUTH ONCE DAILY WITH BREAKFAST  . furosemide (LASIX) 40 MG tablet Take 1 tablet (40 mg  total) by mouth daily as needed for fluid or edema.  Marland Kitchen glipiZIDE (GLUCOTROL XL) 2.5 MG 24 hr tablet TAKE ONE TABLET BY MOUTH ONCE DAILY  . ipratropium (ATROVENT) 0.02 % nebulizer solution Take 2.5 mLs (0.5 mg total) by nebulization every 6 (six) hours as needed for wheezing or shortness of breath. Dx COPD. ICD-9 Code 496.  Marland Kitchen lisinopril (PRINIVIL,ZESTRIL) 5 MG tablet TAKE ONE TABLET BY MOUTH ONCE DAILY  . metoprolol (LOPRESSOR) 100 MG tablet TAKE ONE TABLET BY MOUTH TWICE DAILY  . pantoprazole (PROTONIX) 40 MG tablet Take 1 tablet (40 mg total) by mouth daily.  . pravastatin (PRAVACHOL) 20 MG tablet Take 1 tablet (20 mg total) by mouth daily.  . Probiotic Product (ACIDOPHILUS PROBIOTIC COMPLEX) TABS Take 1 tablet by mouth daily.  Marland Kitchen tiotropium (SPIRIVA HANDIHALER) 18 MCG inhalation capsule Place 1 capsule (18 mcg total) into inhaler and inhale daily.  . VENTOLIN HFA 108 (90 Base) MCG/ACT inhaler Inhale 2 puffs into the lungs every 6 (six) hours as needed for wheezing or shortness of breath. ICD - 10 J44.1 COPD. Needs appointment.  . [DISCONTINUED] cefUROXime (CEFTIN) 250 MG tablet TAKE 1 TABLET BY MOUTH TWICE DAILY WITH A MEAL  . [DISCONTINUED] doxycycline (VIBRAMYCIN) 100 MG capsule   . [DISCONTINUED] predniSONE (DELTASONE) 5 MG tablet TAKE 1 TABLET BY MOUTH ONCE DAILY WITH BREAKFAST  . [EXPIRED] albuterol (PROVENTIL) (2.5 MG/3ML) 0.083% nebulizer solution 2.5 mg    No facility-administered encounter medications on file as of 04/22/2017.           Objective:   Physical Exam  Constitutional: He is oriented to person, place, and time. He appears well-developed and well-nourished.  HENT:  Head: Normocephalic and atraumatic.  Cardiovascular: Normal rate, regular rhythm and normal heart sounds.   Pulmonary/Chest: Effort normal and breath sounds normal.  Prolonged expiration  Neurological: He is alert and oriented to person, place, and time.  Skin: Skin is warm and dry.  Psychiatric: He has a  normal mood and affect. His behavior is normal.          Assessment & Plan:  COPD exacerbation-once he completes a methylprednisolone taper help the back on his 10 mg af prednisone.patient says he was due for his demented nebulizer treatments and we gave that to him here toda in the office.he then he does need his albuterol vials refilled.  Hyperkalemia-due to recheck potassium.  Diabetes, steroid induced-A1c looks fantastic at 5.7. Continue with low-dose glipizide.  Left renal mass-due for ultrasound. Original finding seen on CT done at [facility].

## 2017-04-23 LAB — BASIC METABOLIC PANEL
BUN: 38 mg/dL — AB (ref 7–25)
CHLORIDE: 104 mmol/L (ref 98–110)
CO2: 23 mmol/L (ref 20–32)
CREATININE: 1.04 mg/dL (ref 0.70–1.18)
Calcium: 9.1 mg/dL (ref 8.6–10.3)
Glucose, Bld: 196 mg/dL — ABNORMAL HIGH (ref 65–99)
Potassium: 4.9 mmol/L (ref 3.5–5.3)
Sodium: 139 mmol/L (ref 135–146)

## 2017-04-24 NOTE — Progress Notes (Signed)
All labs are normal. 

## 2017-04-25 ENCOUNTER — Other Ambulatory Visit: Payer: Self-pay | Admitting: *Deleted

## 2017-04-25 DIAGNOSIS — J441 Chronic obstructive pulmonary disease with (acute) exacerbation: Secondary | ICD-10-CM

## 2017-04-25 MED ORDER — ALBUTEROL SULFATE (2.5 MG/3ML) 0.083% IN NEBU: INHALATION_SOLUTION | RESPIRATORY_TRACT | 5 refills | Status: DC

## 2017-04-28 ENCOUNTER — Telehealth: Payer: Self-pay | Admitting: Family Medicine

## 2017-04-28 NOTE — Telephone Encounter (Signed)
Received fax for PA on Albuterol Neb treatments sent through cover my meds waiting on determination. - CF

## 2017-04-29 NOTE — Telephone Encounter (Signed)
I called the pharmacy and they stated they ran the medication through the patients Medicare B Plan and patient picked the medication up yesterday - CF

## 2017-04-29 NOTE — Telephone Encounter (Signed)
Received fax from [place] and they denied coverage on Albuterol Sulfate due to when a patient lives at home and not in an assisted or skilled facility this medication is covered under his Part B Medicare   I will contact the pharmacy for them to bill this through his part B and not his part D. - CF  I will place letter in providers box. - CF

## 2017-05-16 ENCOUNTER — Other Ambulatory Visit: Payer: Self-pay | Admitting: *Deleted

## 2017-05-16 MED ORDER — PREDNISONE 10 MG PO TABS: ORAL_TABLET | ORAL | 0 refills | Status: DC

## 2017-05-16 NOTE — Telephone Encounter (Signed)
Refill for prednisone.Jared Lee [place]

## 2017-05-18 ENCOUNTER — Ambulatory Visit (HOSPITAL_BASED_OUTPATIENT_CLINIC_OR_DEPARTMENT_OTHER): Payer: Medicare Other

## 2017-05-24 DIAGNOSIS — R069 Unspecified abnormalities of breathing: Secondary | ICD-10-CM | POA: Diagnosis not present

## 2017-05-24 DIAGNOSIS — J449 Chronic obstructive pulmonary disease, unspecified: Secondary | ICD-10-CM | POA: Diagnosis not present

## 2017-05-24 DIAGNOSIS — Z9981 Dependence on supplemental oxygen: Secondary | ICD-10-CM | POA: Diagnosis not present

## 2017-05-24 DIAGNOSIS — R05 Cough: Secondary | ICD-10-CM | POA: Diagnosis not present

## 2017-05-24 DIAGNOSIS — R0602 Shortness of breath: Secondary | ICD-10-CM | POA: Diagnosis not present

## 2017-06-09 DIAGNOSIS — J449 Chronic obstructive pulmonary disease, unspecified: Secondary | ICD-10-CM | POA: Diagnosis not present

## 2017-06-09 DIAGNOSIS — J189 Pneumonia, unspecified organism: Secondary | ICD-10-CM | POA: Diagnosis not present

## 2017-06-09 DIAGNOSIS — R05 Cough: Secondary | ICD-10-CM | POA: Diagnosis not present

## 2017-06-09 DIAGNOSIS — R0602 Shortness of breath: Secondary | ICD-10-CM | POA: Diagnosis not present

## 2017-06-09 DIAGNOSIS — Z7982 Long term (current) use of aspirin: Secondary | ICD-10-CM | POA: Diagnosis not present

## 2017-06-09 DIAGNOSIS — I2119 ST elevation (STEMI) myocardial infarction involving other coronary artery of inferior wall: Secondary | ICD-10-CM | POA: Diagnosis not present

## 2017-06-09 DIAGNOSIS — Z7951 Long term (current) use of inhaled steroids: Secondary | ICD-10-CM | POA: Diagnosis not present

## 2017-06-09 DIAGNOSIS — Z880 Allergy status to penicillin: Secondary | ICD-10-CM | POA: Diagnosis not present

## 2017-06-09 DIAGNOSIS — R0600 Dyspnea, unspecified: Secondary | ICD-10-CM | POA: Diagnosis not present

## 2017-06-09 DIAGNOSIS — Z79899 Other long term (current) drug therapy: Secondary | ICD-10-CM | POA: Diagnosis not present

## 2017-06-09 DIAGNOSIS — R9431 Abnormal electrocardiogram [ECG] [EKG]: Secondary | ICD-10-CM | POA: Diagnosis not present

## 2017-06-09 DIAGNOSIS — J181 Lobar pneumonia, unspecified organism: Secondary | ICD-10-CM | POA: Diagnosis not present

## 2017-06-09 DIAGNOSIS — Z881 Allergy status to other antibiotic agents status: Secondary | ICD-10-CM | POA: Diagnosis not present

## 2017-06-09 DIAGNOSIS — R918 Other nonspecific abnormal finding of lung field: Secondary | ICD-10-CM | POA: Diagnosis not present

## 2017-06-09 DIAGNOSIS — R51 Headache: Secondary | ICD-10-CM | POA: Diagnosis not present

## 2017-06-09 DIAGNOSIS — Z888 Allergy status to other drugs, medicaments and biological substances status: Secondary | ICD-10-CM | POA: Diagnosis not present

## 2017-06-09 DIAGNOSIS — Z87891 Personal history of nicotine dependence: Secondary | ICD-10-CM | POA: Diagnosis not present

## 2017-06-09 DIAGNOSIS — I10 Essential (primary) hypertension: Secondary | ICD-10-CM | POA: Diagnosis not present

## 2017-06-09 DIAGNOSIS — E119 Type 2 diabetes mellitus without complications: Secondary | ICD-10-CM | POA: Diagnosis not present

## 2017-06-09 DIAGNOSIS — R001 Bradycardia, unspecified: Secondary | ICD-10-CM | POA: Diagnosis not present

## 2017-06-09 DIAGNOSIS — Z7984 Long term (current) use of oral hypoglycemic drugs: Secondary | ICD-10-CM | POA: Diagnosis not present

## 2017-06-09 DIAGNOSIS — I493 Ventricular premature depolarization: Secondary | ICD-10-CM | POA: Diagnosis not present

## 2017-06-09 DIAGNOSIS — R069 Unspecified abnormalities of breathing: Secondary | ICD-10-CM | POA: Diagnosis not present

## 2017-06-09 DIAGNOSIS — R509 Fever, unspecified: Secondary | ICD-10-CM | POA: Diagnosis not present

## 2017-06-10 DIAGNOSIS — J189 Pneumonia, unspecified organism: Secondary | ICD-10-CM | POA: Diagnosis not present

## 2017-06-10 DIAGNOSIS — I2119 ST elevation (STEMI) myocardial infarction involving other coronary artery of inferior wall: Secondary | ICD-10-CM | POA: Diagnosis not present

## 2017-06-10 DIAGNOSIS — R0600 Dyspnea, unspecified: Secondary | ICD-10-CM | POA: Diagnosis not present

## 2017-06-10 DIAGNOSIS — I493 Ventricular premature depolarization: Secondary | ICD-10-CM | POA: Diagnosis not present

## 2017-06-13 ENCOUNTER — Other Ambulatory Visit: Payer: Self-pay | Admitting: Family Medicine

## 2017-06-14 ENCOUNTER — Other Ambulatory Visit: Payer: Self-pay | Admitting: *Deleted

## 2017-06-14 MED ORDER — PREDNISONE 10 MG PO TABS: ORAL_TABLET | ORAL | 0 refills | Status: DC

## 2017-07-12 ENCOUNTER — Other Ambulatory Visit: Payer: Self-pay | Admitting: Family Medicine

## 2017-07-25 DIAGNOSIS — Z7982 Long term (current) use of aspirin: Secondary | ICD-10-CM | POA: Diagnosis not present

## 2017-07-25 DIAGNOSIS — R0602 Shortness of breath: Secondary | ICD-10-CM | POA: Diagnosis not present

## 2017-07-25 DIAGNOSIS — Z9981 Dependence on supplemental oxygen: Secondary | ICD-10-CM | POA: Diagnosis not present

## 2017-07-25 DIAGNOSIS — I10 Essential (primary) hypertension: Secondary | ICD-10-CM | POA: Diagnosis not present

## 2017-07-25 DIAGNOSIS — I350 Nonrheumatic aortic (valve) stenosis: Secondary | ICD-10-CM | POA: Diagnosis not present

## 2017-07-25 DIAGNOSIS — Z7951 Long term (current) use of inhaled steroids: Secondary | ICD-10-CM | POA: Diagnosis not present

## 2017-07-25 DIAGNOSIS — R05 Cough: Secondary | ICD-10-CM | POA: Diagnosis not present

## 2017-07-25 DIAGNOSIS — J441 Chronic obstructive pulmonary disease with (acute) exacerbation: Secondary | ICD-10-CM | POA: Diagnosis not present

## 2017-07-25 DIAGNOSIS — H409 Unspecified glaucoma: Secondary | ICD-10-CM | POA: Diagnosis present

## 2017-07-25 DIAGNOSIS — R0789 Other chest pain: Secondary | ICD-10-CM | POA: Diagnosis not present

## 2017-07-25 DIAGNOSIS — Z79899 Other long term (current) drug therapy: Secondary | ICD-10-CM | POA: Diagnosis not present

## 2017-07-25 DIAGNOSIS — R509 Fever, unspecified: Secondary | ICD-10-CM | POA: Diagnosis not present

## 2017-07-25 DIAGNOSIS — R9431 Abnormal electrocardiogram [ECG] [EKG]: Secondary | ICD-10-CM | POA: Diagnosis not present

## 2017-07-25 DIAGNOSIS — I110 Hypertensive heart disease with heart failure: Secondary | ICD-10-CM | POA: Diagnosis not present

## 2017-07-25 DIAGNOSIS — H9190 Unspecified hearing loss, unspecified ear: Secondary | ICD-10-CM | POA: Diagnosis present

## 2017-07-25 DIAGNOSIS — Z794 Long term (current) use of insulin: Secondary | ICD-10-CM | POA: Diagnosis not present

## 2017-07-25 DIAGNOSIS — R069 Unspecified abnormalities of breathing: Secondary | ICD-10-CM | POA: Diagnosis not present

## 2017-07-25 DIAGNOSIS — R000 Tachycardia, unspecified: Secondary | ICD-10-CM | POA: Diagnosis not present

## 2017-07-25 DIAGNOSIS — I5032 Chronic diastolic (congestive) heart failure: Secondary | ICD-10-CM | POA: Diagnosis not present

## 2017-07-25 DIAGNOSIS — I454 Nonspecific intraventricular block: Secondary | ICD-10-CM | POA: Diagnosis not present

## 2017-07-25 DIAGNOSIS — J9622 Acute and chronic respiratory failure with hypercapnia: Secondary | ICD-10-CM | POA: Diagnosis not present

## 2017-07-25 DIAGNOSIS — Z881 Allergy status to other antibiotic agents status: Secondary | ICD-10-CM | POA: Diagnosis not present

## 2017-07-25 DIAGNOSIS — J9621 Acute and chronic respiratory failure with hypoxia: Secondary | ICD-10-CM | POA: Diagnosis not present

## 2017-07-25 DIAGNOSIS — R197 Diarrhea, unspecified: Secondary | ICD-10-CM | POA: Diagnosis not present

## 2017-07-25 DIAGNOSIS — I509 Heart failure, unspecified: Secondary | ICD-10-CM | POA: Diagnosis not present

## 2017-07-25 DIAGNOSIS — I2510 Atherosclerotic heart disease of native coronary artery without angina pectoris: Secondary | ICD-10-CM | POA: Diagnosis not present

## 2017-07-25 DIAGNOSIS — E139 Other specified diabetes mellitus without complications: Secondary | ICD-10-CM | POA: Diagnosis not present

## 2017-07-25 DIAGNOSIS — Z87891 Personal history of nicotine dependence: Secondary | ICD-10-CM | POA: Diagnosis not present

## 2017-07-25 DIAGNOSIS — E119 Type 2 diabetes mellitus without complications: Secondary | ICD-10-CM | POA: Diagnosis not present

## 2017-07-30 MED ORDER — DEXTROSE 50 % IV SOLN
12.00 g | INTRAVENOUS | Status: DC
Start: ? — End: 2017-07-30

## 2017-07-30 MED ORDER — PANTOPRAZOLE SODIUM 40 MG PO TBEC
40.00 mg | DELAYED_RELEASE_TABLET | ORAL | Status: DC
Start: 2017-07-31 — End: 2017-07-30

## 2017-07-30 MED ORDER — INSULIN LISPRO 100 UNIT/ML ~~LOC~~ SOLN
1.00 | SUBCUTANEOUS | Status: DC
Start: 2017-07-30 — End: 2017-07-30

## 2017-07-30 MED ORDER — ASPIRIN EC 81 MG PO TBEC
81.00 mg | DELAYED_RELEASE_TABLET | ORAL | Status: DC
Start: 2017-07-31 — End: 2017-07-30

## 2017-07-30 MED ORDER — GLUCOSE 40 % PO GEL
15.00 g | ORAL | Status: DC
Start: ? — End: 2017-07-30

## 2017-07-30 MED ORDER — CALCIUM-VITAMIN D 500-200 MG-UNIT PO TABS
1.00 | ORAL_TABLET | ORAL | Status: DC
Start: 2017-07-31 — End: 2017-07-30

## 2017-07-30 MED ORDER — GENERIC EXTERNAL MEDICATION
5.00 | Status: DC
Start: 2017-07-31 — End: 2017-07-30

## 2017-07-30 MED ORDER — ACETAMINOPHEN 325 MG PO TABS
650.00 mg | ORAL_TABLET | ORAL | Status: DC
Start: ? — End: 2017-07-30

## 2017-07-30 MED ORDER — ALPRAZOLAM 0.25 MG PO TABS
.25 mg | ORAL_TABLET | ORAL | Status: DC
Start: ? — End: 2017-07-30

## 2017-07-30 MED ORDER — GENERIC EXTERNAL MEDICATION
60.00 mg | Status: DC
Start: 2017-07-31 — End: 2017-07-30

## 2017-07-30 MED ORDER — GENERIC EXTERNAL MEDICATION
1.00 mg | Status: DC
Start: ? — End: 2017-07-30

## 2017-07-30 MED ORDER — IPRATROPIUM BROMIDE 0.02 % IN SOLN
0.50 mg | RESPIRATORY_TRACT | Status: DC
Start: 2017-07-30 — End: 2017-07-30

## 2017-07-30 MED ORDER — GENERIC EXTERNAL MEDICATION
2.00 | Status: DC
Start: 2017-07-30 — End: 2017-07-30

## 2017-07-30 MED ORDER — GLIPIZIDE ER 2.5 MG PO TB24
2.50 mg | ORAL_TABLET | ORAL | Status: DC
Start: 2017-07-31 — End: 2017-07-30

## 2017-07-30 MED ORDER — HEPARIN SODIUM (PORCINE) 5000 UNIT/ML IJ SOLN
5000.00 | INTRAMUSCULAR | Status: DC
Start: 2017-07-30 — End: 2017-07-30

## 2017-07-30 MED ORDER — FLUTICASONE FUROATE-VILANTEROL 100-25 MCG/INH IN AEPB
1.00 | INHALATION_SPRAY | RESPIRATORY_TRACT | Status: DC
Start: 2017-07-31 — End: 2017-07-30

## 2017-07-30 MED ORDER — ATORVASTATIN CALCIUM 10 MG PO TABS
20.00 mg | ORAL_TABLET | ORAL | Status: DC
Start: 2017-07-30 — End: 2017-07-30

## 2017-07-30 MED ORDER — ALBUTEROL SULFATE (2.5 MG/3ML) 0.083% IN NEBU
2.50 mg | INHALATION_SOLUTION | RESPIRATORY_TRACT | Status: DC
Start: ? — End: 2017-07-30

## 2017-07-30 MED ORDER — METOPROLOL TARTRATE 50 MG PO TABS
50.00 mg | ORAL_TABLET | ORAL | Status: DC
Start: 2017-07-30 — End: 2017-07-30

## 2017-07-30 MED ORDER — ALBUTEROL SULFATE (5 MG/ML) 0.5% IN NEBU
2.50 mg | INHALATION_SOLUTION | RESPIRATORY_TRACT | Status: DC
Start: 2017-07-30 — End: 2017-07-30

## 2017-08-09 ENCOUNTER — Inpatient Hospital Stay: Payer: Medicare Other | Admitting: Family Medicine

## 2017-08-13 ENCOUNTER — Other Ambulatory Visit: Payer: Self-pay | Admitting: Family Medicine

## 2017-08-15 NOTE — Telephone Encounter (Signed)
Please advise on medication refill...thanks  Last seen:11/25/16 Last refill:07/19/16

## 2017-08-28 ENCOUNTER — Other Ambulatory Visit: Payer: Self-pay | Admitting: Family Medicine

## 2017-08-29 NOTE — Telephone Encounter (Signed)
Is this ok top fill

## 2017-09-26 ENCOUNTER — Other Ambulatory Visit: Payer: Self-pay | Admitting: *Deleted

## 2017-09-26 MED ORDER — LISINOPRIL 5 MG PO TABS: 5.0000 mg | ORAL_TABLET | Freq: Every day | ORAL | 0 refills | Status: DC

## 2017-10-24 ENCOUNTER — Other Ambulatory Visit: Payer: Self-pay | Admitting: *Deleted

## 2017-10-24 DIAGNOSIS — J441 Chronic obstructive pulmonary disease with (acute) exacerbation: Secondary | ICD-10-CM

## 2017-10-24 MED ORDER — ALBUTEROL SULFATE (2.5 MG/3ML) 0.083% IN NEBU: INHALATION_SOLUTION | RESPIRATORY_TRACT | 5 refills | Status: DC

## 2017-10-26 DIAGNOSIS — Z9981 Dependence on supplemental oxygen: Secondary | ICD-10-CM | POA: Diagnosis not present

## 2017-10-26 DIAGNOSIS — J9622 Acute and chronic respiratory failure with hypercapnia: Secondary | ICD-10-CM | POA: Diagnosis not present

## 2017-10-26 DIAGNOSIS — J9612 Chronic respiratory failure with hypercapnia: Secondary | ICD-10-CM | POA: Diagnosis not present

## 2017-10-26 DIAGNOSIS — R509 Fever, unspecified: Secondary | ICD-10-CM | POA: Diagnosis not present

## 2017-10-26 DIAGNOSIS — J441 Chronic obstructive pulmonary disease with (acute) exacerbation: Secondary | ICD-10-CM | POA: Diagnosis not present

## 2017-10-26 DIAGNOSIS — R062 Wheezing: Secondary | ICD-10-CM | POA: Diagnosis not present

## 2017-10-26 DIAGNOSIS — J9621 Acute and chronic respiratory failure with hypoxia: Secondary | ICD-10-CM | POA: Diagnosis not present

## 2017-10-26 DIAGNOSIS — R05 Cough: Secondary | ICD-10-CM | POA: Diagnosis not present

## 2017-10-26 DIAGNOSIS — I110 Hypertensive heart disease with heart failure: Secondary | ICD-10-CM | POA: Diagnosis not present

## 2017-10-26 DIAGNOSIS — J449 Chronic obstructive pulmonary disease, unspecified: Secondary | ICD-10-CM | POA: Diagnosis not present

## 2017-10-26 DIAGNOSIS — R0602 Shortness of breath: Secondary | ICD-10-CM | POA: Diagnosis not present

## 2017-10-26 DIAGNOSIS — R531 Weakness: Secondary | ICD-10-CM | POA: Diagnosis not present

## 2017-10-26 DIAGNOSIS — R404 Transient alteration of awareness: Secondary | ICD-10-CM | POA: Diagnosis not present

## 2017-10-26 DIAGNOSIS — I5030 Unspecified diastolic (congestive) heart failure: Secondary | ICD-10-CM | POA: Diagnosis not present

## 2017-10-26 DIAGNOSIS — J9611 Chronic respiratory failure with hypoxia: Secondary | ICD-10-CM | POA: Diagnosis not present

## 2017-10-27 DIAGNOSIS — J9622 Acute and chronic respiratory failure with hypercapnia: Secondary | ICD-10-CM | POA: Diagnosis not present

## 2017-10-27 DIAGNOSIS — J9621 Acute and chronic respiratory failure with hypoxia: Secondary | ICD-10-CM | POA: Diagnosis not present

## 2017-10-27 DIAGNOSIS — Z7952 Long term (current) use of systemic steroids: Secondary | ICD-10-CM | POA: Diagnosis not present

## 2017-10-27 DIAGNOSIS — I5030 Unspecified diastolic (congestive) heart failure: Secondary | ICD-10-CM | POA: Diagnosis present

## 2017-10-27 DIAGNOSIS — R531 Weakness: Secondary | ICD-10-CM | POA: Diagnosis not present

## 2017-10-27 DIAGNOSIS — J9601 Acute respiratory failure with hypoxia: Secondary | ICD-10-CM | POA: Diagnosis not present

## 2017-10-27 DIAGNOSIS — I350 Nonrheumatic aortic (valve) stenosis: Secondary | ICD-10-CM | POA: Diagnosis not present

## 2017-10-27 DIAGNOSIS — I5031 Acute diastolic (congestive) heart failure: Secondary | ICD-10-CM | POA: Diagnosis not present

## 2017-10-27 DIAGNOSIS — Z794 Long term (current) use of insulin: Secondary | ICD-10-CM | POA: Diagnosis not present

## 2017-10-27 DIAGNOSIS — Z79899 Other long term (current) drug therapy: Secondary | ICD-10-CM | POA: Diagnosis not present

## 2017-10-27 DIAGNOSIS — E119 Type 2 diabetes mellitus without complications: Secondary | ICD-10-CM | POA: Diagnosis not present

## 2017-10-27 DIAGNOSIS — R5381 Other malaise: Secondary | ICD-10-CM | POA: Diagnosis not present

## 2017-10-27 DIAGNOSIS — Z87891 Personal history of nicotine dependence: Secondary | ICD-10-CM | POA: Diagnosis not present

## 2017-10-27 DIAGNOSIS — R21 Rash and other nonspecific skin eruption: Secondary | ICD-10-CM | POA: Diagnosis not present

## 2017-10-27 DIAGNOSIS — T7840XA Allergy, unspecified, initial encounter: Secondary | ICD-10-CM | POA: Diagnosis not present

## 2017-10-27 DIAGNOSIS — Z7984 Long term (current) use of oral hypoglycemic drugs: Secondary | ICD-10-CM | POA: Diagnosis not present

## 2017-10-27 DIAGNOSIS — I10 Essential (primary) hypertension: Secondary | ICD-10-CM | POA: Diagnosis not present

## 2017-10-27 DIAGNOSIS — E139 Other specified diabetes mellitus without complications: Secondary | ICD-10-CM | POA: Diagnosis not present

## 2017-10-27 DIAGNOSIS — I110 Hypertensive heart disease with heart failure: Secondary | ICD-10-CM | POA: Diagnosis not present

## 2017-10-27 DIAGNOSIS — I119 Hypertensive heart disease without heart failure: Secondary | ICD-10-CM | POA: Diagnosis not present

## 2017-10-27 DIAGNOSIS — R0602 Shortness of breath: Secondary | ICD-10-CM | POA: Diagnosis not present

## 2017-10-27 DIAGNOSIS — L539 Erythematous condition, unspecified: Secondary | ICD-10-CM | POA: Diagnosis not present

## 2017-10-27 DIAGNOSIS — Z7951 Long term (current) use of inhaled steroids: Secondary | ICD-10-CM | POA: Diagnosis not present

## 2017-10-27 DIAGNOSIS — M6281 Muscle weakness (generalized): Secondary | ICD-10-CM | POA: Diagnosis not present

## 2017-10-27 DIAGNOSIS — I2510 Atherosclerotic heart disease of native coronary artery without angina pectoris: Secondary | ICD-10-CM | POA: Diagnosis not present

## 2017-10-27 DIAGNOSIS — J9611 Chronic respiratory failure with hypoxia: Secondary | ICD-10-CM | POA: Diagnosis not present

## 2017-10-27 DIAGNOSIS — Z7982 Long term (current) use of aspirin: Secondary | ICD-10-CM | POA: Diagnosis not present

## 2017-10-27 DIAGNOSIS — J441 Chronic obstructive pulmonary disease with (acute) exacerbation: Secondary | ICD-10-CM | POA: Diagnosis not present

## 2017-10-27 DIAGNOSIS — Z9981 Dependence on supplemental oxygen: Secondary | ICD-10-CM | POA: Diagnosis not present

## 2017-10-27 DIAGNOSIS — J9612 Chronic respiratory failure with hypercapnia: Secondary | ICD-10-CM | POA: Diagnosis not present

## 2017-11-01 MED ORDER — ENOXAPARIN SODIUM 40 MG/0.4ML ~~LOC~~ SOLN
40.00 | SUBCUTANEOUS | Status: DC
Start: 2017-11-01 — End: 2017-11-01

## 2017-11-01 MED ORDER — ASPIRIN EC 81 MG PO TBEC
81.00 | DELAYED_RELEASE_TABLET | ORAL | Status: DC
Start: 2017-11-02 — End: 2017-11-01

## 2017-11-01 MED ORDER — GLIPIZIDE ER 2.5 MG PO TB24
2.50 | ORAL_TABLET | ORAL | Status: DC
Start: 2017-11-02 — End: 2017-11-01

## 2017-11-01 MED ORDER — PREDNISONE 10 MG PO TABS
10.00 | ORAL_TABLET | ORAL | Status: DC
Start: ? — End: 2017-11-01

## 2017-11-01 MED ORDER — INSULIN LISPRO 100 UNIT/ML ~~LOC~~ SOLN
1.00 | SUBCUTANEOUS | Status: DC
Start: 2017-11-01 — End: 2017-11-01

## 2017-11-01 MED ORDER — GENERIC EXTERNAL MEDICATION
1.00 | Status: DC
Start: ? — End: 2017-11-01

## 2017-11-01 MED ORDER — ALBUTEROL SULFATE (2.5 MG/3ML) 0.083% IN NEBU
2.50 | INHALATION_SOLUTION | RESPIRATORY_TRACT | Status: DC
Start: ? — End: 2017-11-01

## 2017-11-01 MED ORDER — ATORVASTATIN CALCIUM 10 MG PO TABS
20.00 | ORAL_TABLET | ORAL | Status: DC
Start: 2017-11-01 — End: 2017-11-01

## 2017-11-01 MED ORDER — GLUCOSE 40 % PO GEL
15.00 g | ORAL | Status: DC
Start: ? — End: 2017-11-01

## 2017-11-01 MED ORDER — DIPHENHYDRAMINE HCL 25 MG PO CAPS
25.00 | ORAL_CAPSULE | ORAL | Status: DC
Start: 2017-11-01 — End: 2017-11-01

## 2017-11-01 MED ORDER — LISINOPRIL 5 MG PO TABS
5.00 | ORAL_TABLET | ORAL | Status: DC
Start: 2017-11-02 — End: 2017-11-01

## 2017-11-01 MED ORDER — PREDNISONE 20 MG PO TABS
20.00 | ORAL_TABLET | ORAL | Status: DC
Start: ? — End: 2017-11-01

## 2017-11-01 MED ORDER — METOPROLOL TARTRATE 50 MG PO TABS
100.00 | ORAL_TABLET | ORAL | Status: DC
Start: 2017-11-01 — End: 2017-11-01

## 2017-11-01 MED ORDER — CALCIUM-VITAMIN D 500-200 MG-UNIT PO TABS
1.00 | ORAL_TABLET | ORAL | Status: DC
Start: 2017-11-02 — End: 2017-11-01

## 2017-11-01 MED ORDER — ALBUTEROL SULFATE (5 MG/ML) 0.5% IN NEBU
2.50 | INHALATION_SOLUTION | RESPIRATORY_TRACT | Status: DC
Start: 2017-11-01 — End: 2017-11-01

## 2017-11-01 MED ORDER — GENERIC EXTERNAL MEDICATION
30.00 | Status: DC
Start: ? — End: 2017-11-01

## 2017-11-01 MED ORDER — CHOLESTYRAMINE 4 G PO PACK
1.00 g | PACK | ORAL | Status: DC
Start: 2017-11-01 — End: 2017-11-01

## 2017-11-01 MED ORDER — PREDNISONE 20 MG PO TABS
40.00 | ORAL_TABLET | ORAL | Status: DC
Start: ? — End: 2017-11-01

## 2017-11-01 MED ORDER — IPRATROPIUM BROMIDE 0.02 % IN SOLN
0.50 | RESPIRATORY_TRACT | Status: DC
Start: 2017-11-01 — End: 2017-11-01

## 2017-11-01 MED ORDER — ACETAMINOPHEN 325 MG PO TABS
650.00 | ORAL_TABLET | ORAL | Status: DC
Start: ? — End: 2017-11-01

## 2017-11-01 MED ORDER — ALPRAZOLAM 0.25 MG PO TABS
0.25 | ORAL_TABLET | ORAL | Status: DC
Start: ? — End: 2017-11-01

## 2017-11-01 MED ORDER — DEXTROSE 50 % IV SOLN
12.00 g | INTRAVENOUS | Status: DC
Start: ? — End: 2017-11-01

## 2017-11-08 ENCOUNTER — Encounter: Payer: Self-pay | Admitting: Family Medicine

## 2017-11-08 ENCOUNTER — Ambulatory Visit (INDEPENDENT_AMBULATORY_CARE_PROVIDER_SITE_OTHER): Payer: Medicare Other | Admitting: Family Medicine

## 2017-11-08 VITALS — BP 137/90 | HR 74

## 2017-11-08 DIAGNOSIS — E119 Type 2 diabetes mellitus without complications: Secondary | ICD-10-CM

## 2017-11-08 DIAGNOSIS — M2550 Pain in unspecified joint: Secondary | ICD-10-CM

## 2017-11-08 DIAGNOSIS — B370 Candidal stomatitis: Secondary | ICD-10-CM

## 2017-11-08 DIAGNOSIS — J441 Chronic obstructive pulmonary disease with (acute) exacerbation: Secondary | ICD-10-CM

## 2017-11-08 DIAGNOSIS — I10 Essential (primary) hypertension: Secondary | ICD-10-CM | POA: Diagnosis not present

## 2017-11-08 DIAGNOSIS — R21 Rash and other nonspecific skin eruption: Secondary | ICD-10-CM | POA: Insufficient documentation

## 2017-11-08 LAB — POCT GLYCOSYLATED HEMOGLOBIN (HGB A1C): Hemoglobin A1C: 6.2

## 2017-11-08 MED ORDER — NYSTATIN 100000 UNIT/ML MT SUSP: 5.0000 mL | Freq: Four times a day (QID) | OROMUCOSAL | 0 refills | Status: AC

## 2017-11-08 MED ORDER — TIOTROPIUM BROMIDE MONOHYDRATE 18 MCG IN CAPS: 18.0000 ug | ORAL_CAPSULE | Freq: Every day | RESPIRATORY_TRACT | 12 refills | Status: DC

## 2017-11-08 MED ORDER — METHYLPREDNISOLONE ACETATE 80 MG/ML IJ SUSP
80.0000 mg | Freq: Once | INTRAMUSCULAR | Status: AC
  Administered 2017-11-08: 80 mg via INTRAMUSCULAR

## 2017-11-08 MED ORDER — DOXYCYCLINE HYCLATE 100 MG PO TABS: 100.0000 mg | ORAL_TABLET | Freq: Two times a day (BID) | ORAL | 0 refills | Status: DC

## 2017-11-08 MED ORDER — PREDNISONE 10 MG PO TABS: 10.0000 mg | ORAL_TABLET | Freq: Every day | ORAL | 3 refills | Status: DC

## 2017-11-08 MED ORDER — ALBUTEROL SULFATE (2.5 MG/3ML) 0.083% IN NEBU
2.5000 mg | INHALATION_SOLUTION | Freq: Once | RESPIRATORY_TRACT | Status: AC
Start: 2017-11-08 — End: 2017-11-08
  Administered 2017-11-08: 2.5 mg via RESPIRATORY_TRACT

## 2017-11-08 MED ORDER — BUDESONIDE-FORMOTEROL FUMARATE 160-4.5 MCG/ACT IN AERO: 2.0000 | INHALATION_SPRAY | Freq: Two times a day (BID) | RESPIRATORY_TRACT | 6 refills | Status: DC

## 2017-11-08 NOTE — Progress Notes (Addendum)
Subjective:    Patient ID: Jared Lee, male    DOB: Sep 15, 1940, 77 y.o.   MRN: [id number]  HPI 77 year old male with a history of chronic obstructive pulmonary disease and chronic respiratory failure comes in today for recent hospitalization follow-up.  He was actually admitted November 26 and then again February 27.  Discharged home March 5.  He actually went 3 months without an admission which is actually pretty good for him for all.  Says he just has not felt well since being discharged home.  He still getting some brown and occasionally yellow colored mucus from his chest.  He still feeling very short of breath.  He did wear his oxygen and here today.  Diabetes - no hypoglycemic events. No wounds or sores that are not healing well. No increased thirst or urination. Checking glucose at home. Taking medications as prescribed without any side effects.  Also complains that he is losing his voice in his throat has been sore.  Even his tongue feels a little sore.  Hypertension- Pt denies chest pain, SOB, dizziness, or heart palpitations.  Taking meds as directed w/o problems.  Denies medication side effects.    He feels like he is getting some joint pain and wonders if it could be from his cholesterol pill.  Review of Systems BP 137/90   Pulse 74   SpO2 97% Comment: 2L    Allergies  Allergen Reactions  . Chlorpromazine Swelling  . Fluticasone-Salmeterol Swelling  . Augmentin [Amoxicillin-Pot Clavulanate] Nausea And Vomiting    Stomach pain as well.   . Azithromycin Diarrhea    severe  . Daliresp [Roflumilast] Other (See Comments)    HA, diarrhea   . Levaquin [Levofloxacin]   . Prednisone Other (See Comments)    Swelling    Past Medical History:  Diagnosis Date  . COPD (chronic obstructive pulmonary disease) ([place])   . H/O colonoscopy 02/03/12   EGD as well. Mildy nodular gastritis.    . Hyperlipemia   . Hypertension   . Normal cardiac stress test 04/14/12   High POint [facility]     Past Surgical History:  Procedure Laterality Date  . CATARACT EXTRACTION Right 03/15/2016  . CATARACT EXTRACTION W/ INTRAOCULAR LENS IMPLANT Left 04/05/2016  . IR GENERIC HISTORICAL  08/04/2016   IR RADIOLOGIST EVAL & MGMT 08/04/2016 [name], MD [facility] INTERV RAD    Social History   Socioeconomic History  . Marital status: Single    Spouse name: Not on file  . Number of children: Not on file  . Years of education: Not on file  . Highest education level: Not on file  Social Needs  . Financial resource strain: Not on file  . Food insecurity - worry: Not on file  . Food insecurity - inability: Not on file  . Transportation needs - medical: Not on file  . Transportation needs - non-medical: Not on file  Occupational History  . Not on file  Tobacco Use  . Smoking status: Former Smoker    Packs/day: 1.00    Years: 66.00    Pack years: 66.00    Types: Cigarettes    Last attempt to quit: 08/31/2011    Years since quitting: 6.1  . Smokeless tobacco: Never Used  Substance and Sexual Activity  . Alcohol use: No  . Drug use: No  . Sexual activity: Not on file  Other Topics Concern  . Not on file  Social History Narrative  . Not on file  Family History  Problem Relation Age of Onset  . Heart disease Father   . Hypertension Father   . Emphysema Father   . Emphysema Paternal Grandfather     Outpatient Encounter Medications as of 11/08/2017  Medication Sig  . albuterol (PROVENTIL) (2.5 MG/3ML) 0.083% nebulizer solution USE 1 VIAL IN NEBULIZER EVERY 4 HOURS AS NEEDED FOR WHEEZING OR SHORTNESS OF BREATH. DX: COPD J44.1  . ALPRAZolam (XANAX) 0.25 MG tablet Take 0.25 mg by mouth.  . AMBULATORY NON FORMULARY MEDICATION Medication Name: Ambulatory oxygen cocentrator.  Pt is mobile within th home.  Pt requests portable concentrator. Using [employer] for oxygen needs.  . AMBULATORY NON FORMULARY MEDICATION Medication Name: One Touch Ultra Test Strips for BID Testing Daily. Dx  Code: 250.00  Dx: Type II or unspecified type diabetes mellitus without mention of complication, not stated as uncontrolled (250.00)  . aspirin 81 MG tablet Take 81 mg by mouth daily.  . Blood Glucose Monitoring Suppl (BLOOD GLUCOSE MONITOR SYSTEM) W/DEVICE KIT Check twice a day.  . budesonide-formoterol (SYMBICORT) 160-4.5 MCG/ACT inhaler Inhale 2 puffs into the lungs 2 (two) times daily.  . Calcium Carb-Cholecalciferol (CALCIUM-VITAMIN D) 500-200 MG-UNIT tablet Take 1 tablet by mouth daily.  . colestipol (COLESTID) 1 g tablet TAKE 1 TABLET BY MOUTH TWICE DAILY AS NEEDED FOR DIARRHEA  . ferrous sulfate 325 (65 FE) MG tablet TAKE ONE TABLET BY MOUTH ONCE DAILY WITH BREAKFAST  . glipiZIDE (GLUCOTROL XL) 2.5 MG 24 hr tablet TAKE ONE TABLET BY MOUTH ONCE DAILY  . ipratropium (ATROVENT) 0.02 % nebulizer solution Take 2.5 mLs (0.5 mg total) by nebulization every 6 (six) hours as needed for wheezing or shortness of breath. Dx COPD. ICD-9 Code 496.  Marland Kitchen lisinopril (PRINIVIL,ZESTRIL) 5 MG tablet Take 1 tablet (5 mg total) by mouth daily. Needs an appointment  . metoprolol tartrate (LOPRESSOR) 100 MG tablet TAKE ONE TABLET BY MOUTH TWICE DAILY  . pantoprazole (PROTONIX) 40 MG tablet Take 1 tablet (40 mg total) by mouth daily.  . pravastatin (PRAVACHOL) 20 MG tablet Take 1 tablet (20 mg total) by mouth daily.  . predniSONE (DELTASONE) 10 MG tablet Take 1 tablet (10 mg total) by mouth daily. TAKE 4 TABLETS ONCE DAILY FOR 2 DAYS, 3 DAILY FOR 2 DAYS, 2 DAILY FOR 2 DAYS, 1 DAILY FOR 2 DAYS, UNTIL COMPLETE  . Probiotic Product (ACIDOPHILUS PROBIOTIC COMPLEX) TABS Take 1 tablet by mouth daily.  Marland Kitchen tiotropium (SPIRIVA HANDIHALER) 18 MCG inhalation capsule Place 1 capsule (18 mcg total) into inhaler and inhale daily.  . VENTOLIN HFA 108 (90 Base) MCG/ACT inhaler Inhale 2 puffs into the lungs every 6 (six) hours as needed for wheezing or shortness of breath. ICD - 10 J44.1 COPD. Needs appointment.  . [DISCONTINUED]  budesonide-formoterol (SYMBICORT) 160-4.5 MCG/ACT inhaler Inhale 2 puffs into the lungs 2 (two) times daily.  . [DISCONTINUED] furosemide (LASIX) 40 MG tablet Take 1 tablet (40 mg total) by mouth daily as needed for fluid or edema.  . [DISCONTINUED] predniSONE (DELTASONE) 10 MG tablet TAKE 4 TABLETS ONCE DAILY FOR 2 DAYS, 3 DAILY FOR 2 DAYS, 2 DAILY FOR 2 DAYS, 1 DAILY FOR 2 DAYS, UNTIL COMPLETE  . [DISCONTINUED] tiotropium (SPIRIVA HANDIHALER) 18 MCG inhalation capsule Place 1 capsule (18 mcg total) into inhaler and inhale daily.  Marland Kitchen doxycycline (VIBRA-TABS) 100 MG tablet Take 1 tablet (100 mg total) by mouth 2 (two) times daily.  Marland Kitchen nystatin (MYCOSTATIN) 100000 UNIT/ML suspension Take 5 mLs (500,000 Units total) by mouth  4 (four) times daily for 7 days. Swish and hold in mouth for at least 2 minutes and then swallow.  . [EXPIRED] albuterol (PROVENTIL) (2.5 MG/3ML) 0.083% nebulizer solution 2.5 mg   . [EXPIRED] methylPREDNISolone acetate (DEPO-MEDROL) injection 80 mg    No facility-administered encounter medications on file as of 11/08/2017.          Objective:   Physical Exam  Constitutional: He is oriented to person, place, and time. He appears well-developed and well-nourished.  HENT:  Head: Normocephalic and atraumatic.  Right Ear: External ear normal.  Left Ear: External ear normal.  Nose: Nose normal.  Mouth/Throat: Oropharynx is clear and moist.  TMs and canals are clear. Black looking wax in the left ear.   Eyes: Conjunctivae and EOM are normal. Pupils are equal, round, and reactive to light.  Neck: Neck supple. No thyromegaly present.  Cardiovascular: Normal rate, regular rhythm and normal heart sounds.  Pulmonary/Chest: Effort normal and breath sounds normal.  Lymphadenopathy:    He has no cervical adenopathy.  Neurological: He is alert and oriented to person, place, and time.  Skin: Skin is warm and dry.  Psychiatric: He has a normal mood and affect. His behavior is normal.         Assessment & Plan:  COPD -the OPD exacerbation.  Will treat with prednisone and doxycycline.  He does not do well with high doses of prednisone but usually does well with 10 mg.  If he goes much higher than that that it actually causes significant lower extremity swelling.  DM -controlled.  Hemoglobin A1c looks fantastic today's.  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  We will just continue to monitor carefully.  He did have a lot of facial fullness today which is likely a side effect from chronic steroids.  HTN - Well controlled. Continue current regimen. Follow up in  6 months.    I suspect that he has thrush-I did not see clear evidence but with the sore throat and voice raspiness after recent use of chronic inhaled steroids in addition to oral steroids recently he has high risk so we will treat with nystatin swish and swallow.  Joint pain-okay to stop cholesterol medication for about a month and see if he notices a difference in how he is feeling.  If not then encouraged him to restart it.  If he feels better off of it then please give me a call and we can try something different.

## 2017-11-08 NOTE — Addendum Note (Signed)
Addended by: Beatrice Lecher D on: 11/08/2017 02:39 PM   Modules accepted: Level of Service

## 2017-11-08 NOTE — Patient Instructions (Addendum)
You are having a flare with your COPD.  Pick up your antibiotic, doxycycline, today at the pharmacy and try to start it this evening if you are able to. We gave you a shot of a steroid.  For your sore throat and mouth I sent over a prescription of nystatin swish and swallow.  I think you have thrush.  This is a fungal infection in the throat.  You will swish it around in your mouth and then swallow it.  Your diabetes looks great.  We will continue to keep an eye on it and plan to recheck it again in about 3-4 4 months.

## 2017-11-14 ENCOUNTER — Telehealth: Payer: Self-pay | Admitting: Family Medicine

## 2017-11-14 MED ORDER — UMECLIDINIUM BROMIDE 62.5 MCG/INH IN AEPB: 1.0000 | INHALATION_SPRAY | Freq: Every day | RESPIRATORY_TRACT | 99 refills | Status: DC

## 2017-11-14 NOTE — Telephone Encounter (Signed)
Call pt: Spiriva not covered by insurance.  They covered it this time but will not cover future refills. Will send in a new script called Incruse to start in place of the Sp[riva once he runs out.

## 2017-11-15 NOTE — Telephone Encounter (Signed)
Called pt and informed him of medication changes. He voiced understanding.Elouise Munroe, [place]

## 2017-11-22 ENCOUNTER — Other Ambulatory Visit: Payer: Self-pay | Admitting: Family Medicine

## 2017-12-04 DIAGNOSIS — R9431 Abnormal electrocardiogram [ECG] [EKG]: Secondary | ICD-10-CM | POA: Diagnosis not present

## 2017-12-04 DIAGNOSIS — Z9981 Dependence on supplemental oxygen: Secondary | ICD-10-CM | POA: Diagnosis not present

## 2017-12-04 DIAGNOSIS — R0602 Shortness of breath: Secondary | ICD-10-CM | POA: Diagnosis not present

## 2017-12-04 DIAGNOSIS — Z87891 Personal history of nicotine dependence: Secondary | ICD-10-CM | POA: Diagnosis not present

## 2017-12-04 DIAGNOSIS — I10 Essential (primary) hypertension: Secondary | ICD-10-CM | POA: Diagnosis not present

## 2017-12-04 DIAGNOSIS — E785 Hyperlipidemia, unspecified: Secondary | ICD-10-CM | POA: Diagnosis not present

## 2017-12-04 DIAGNOSIS — J438 Other emphysema: Secondary | ICD-10-CM | POA: Diagnosis not present

## 2017-12-04 DIAGNOSIS — I493 Ventricular premature depolarization: Secondary | ICD-10-CM | POA: Diagnosis not present

## 2017-12-04 DIAGNOSIS — J441 Chronic obstructive pulmonary disease with (acute) exacerbation: Secondary | ICD-10-CM | POA: Diagnosis not present

## 2017-12-04 DIAGNOSIS — R05 Cough: Secondary | ICD-10-CM | POA: Diagnosis not present

## 2017-12-04 DIAGNOSIS — E1165 Type 2 diabetes mellitus with hyperglycemia: Secondary | ICD-10-CM | POA: Diagnosis not present

## 2017-12-04 DIAGNOSIS — J439 Emphysema, unspecified: Secondary | ICD-10-CM | POA: Diagnosis not present

## 2017-12-04 DIAGNOSIS — E119 Type 2 diabetes mellitus without complications: Secondary | ICD-10-CM | POA: Diagnosis not present

## 2017-12-04 DIAGNOSIS — Z7951 Long term (current) use of inhaled steroids: Secondary | ICD-10-CM | POA: Diagnosis not present

## 2017-12-04 DIAGNOSIS — R531 Weakness: Secondary | ICD-10-CM | POA: Diagnosis not present

## 2017-12-04 DIAGNOSIS — H903 Sensorineural hearing loss, bilateral: Secondary | ICD-10-CM | POA: Diagnosis not present

## 2017-12-04 DIAGNOSIS — J9621 Acute and chronic respiratory failure with hypoxia: Secondary | ICD-10-CM | POA: Diagnosis not present

## 2017-12-04 DIAGNOSIS — H905 Unspecified sensorineural hearing loss: Secondary | ICD-10-CM | POA: Diagnosis not present

## 2017-12-04 DIAGNOSIS — R5383 Other fatigue: Secondary | ICD-10-CM | POA: Diagnosis not present

## 2017-12-04 DIAGNOSIS — R062 Wheezing: Secondary | ICD-10-CM | POA: Diagnosis not present

## 2017-12-04 DIAGNOSIS — T380X5A Adverse effect of glucocorticoids and synthetic analogues, initial encounter: Secondary | ICD-10-CM | POA: Diagnosis not present

## 2017-12-04 DIAGNOSIS — J3489 Other specified disorders of nose and nasal sinuses: Secondary | ICD-10-CM | POA: Diagnosis not present

## 2017-12-04 DIAGNOSIS — Z794 Long term (current) use of insulin: Secondary | ICD-10-CM | POA: Diagnosis not present

## 2017-12-04 DIAGNOSIS — E0965 Drug or chemical induced diabetes mellitus with hyperglycemia: Secondary | ICD-10-CM | POA: Diagnosis not present

## 2017-12-04 DIAGNOSIS — I2510 Atherosclerotic heart disease of native coronary artery without angina pectoris: Secondary | ICD-10-CM | POA: Diagnosis not present

## 2017-12-04 DIAGNOSIS — I480 Paroxysmal atrial fibrillation: Secondary | ICD-10-CM | POA: Diagnosis not present

## 2017-12-07 DIAGNOSIS — J439 Emphysema, unspecified: Secondary | ICD-10-CM | POA: Insufficient documentation

## 2017-12-12 MED ORDER — ONDANSETRON HCL 4 MG/2ML IJ SOLN
4.00 | INTRAMUSCULAR | Status: DC
Start: ? — End: 2017-12-12

## 2017-12-12 MED ORDER — ASPIRIN EC 81 MG PO TBEC
81.00 | DELAYED_RELEASE_TABLET | ORAL | Status: DC
Start: 2017-12-13 — End: 2017-12-12

## 2017-12-12 MED ORDER — ALBUTEROL SULFATE (2.5 MG/3ML) 0.083% IN NEBU
2.50 | INHALATION_SOLUTION | RESPIRATORY_TRACT | Status: DC
Start: 2017-12-12 — End: 2017-12-12

## 2017-12-12 MED ORDER — ALBUTEROL SULFATE HFA 108 (90 BASE) MCG/ACT IN AERS
2.00 | INHALATION_SPRAY | RESPIRATORY_TRACT | Status: DC
Start: ? — End: 2017-12-12

## 2017-12-12 MED ORDER — HYDROCODONE-ACETAMINOPHEN 5-325 MG PO TABS
1.00 | ORAL_TABLET | ORAL | Status: DC
Start: ? — End: 2017-12-12

## 2017-12-12 MED ORDER — INSULIN LISPRO 100 UNIT/ML ~~LOC~~ SOLN
1.00 | SUBCUTANEOUS | Status: DC
Start: 2017-12-12 — End: 2017-12-12

## 2017-12-12 MED ORDER — METHYLPREDNISOLONE SODIUM SUCC 40 MG IJ SOLR
40.00 | INTRAMUSCULAR | Status: DC
Start: 2017-12-12 — End: 2017-12-12

## 2017-12-12 MED ORDER — PANTOPRAZOLE SODIUM 40 MG PO TBEC
40.00 | DELAYED_RELEASE_TABLET | ORAL | Status: DC
Start: 2017-12-13 — End: 2017-12-12

## 2017-12-12 MED ORDER — ACETAMINOPHEN 325 MG PO TABS
650.00 | ORAL_TABLET | ORAL | Status: DC
Start: ? — End: 2017-12-12

## 2017-12-12 MED ORDER — GUAIFENESIN-DM 100-10 MG/5ML PO SYRP
5.00 | ORAL_SOLUTION | ORAL | Status: DC
Start: ? — End: 2017-12-12

## 2017-12-12 MED ORDER — DEXTROSE 50 % IV SOLN
12.00 g | INTRAVENOUS | Status: DC
Start: ? — End: 2017-12-12

## 2017-12-12 MED ORDER — LISINOPRIL 5 MG PO TABS
5.00 | ORAL_TABLET | ORAL | Status: DC
Start: 2017-12-13 — End: 2017-12-12

## 2017-12-12 MED ORDER — ALPRAZOLAM 0.25 MG PO TABS
0.25 | ORAL_TABLET | ORAL | Status: DC
Start: ? — End: 2017-12-12

## 2017-12-12 MED ORDER — HEPARIN SODIUM (PORCINE) 5000 UNIT/ML IJ SOLN
5000.00 | INTRAMUSCULAR | Status: DC
Start: 2017-12-12 — End: 2017-12-12

## 2017-12-12 MED ORDER — GLUCOSE 40 % PO GEL
15.00 g | ORAL | Status: DC
Start: ? — End: 2017-12-12

## 2017-12-12 MED ORDER — UMECLIDINIUM BROMIDE 62.5 MCG/INH IN AEPB
1.00 | INHALATION_SPRAY | RESPIRATORY_TRACT | Status: DC
Start: 2017-12-13 — End: 2017-12-12

## 2017-12-12 MED ORDER — METOPROLOL TARTRATE 50 MG PO TABS
100.00 | ORAL_TABLET | ORAL | Status: DC
Start: 2017-12-12 — End: 2017-12-12

## 2017-12-12 MED ORDER — GENERIC EXTERNAL MEDICATION
1.00 | Status: DC
Start: ? — End: 2017-12-12

## 2017-12-12 MED ORDER — HYDRALAZINE HCL 20 MG/ML IJ SOLN
10.00 | INTRAMUSCULAR | Status: DC
Start: ? — End: 2017-12-12

## 2017-12-12 MED ORDER — ATORVASTATIN CALCIUM 10 MG PO TABS
10.00 | ORAL_TABLET | ORAL | Status: DC
Start: 2017-12-12 — End: 2017-12-12

## 2017-12-12 MED ORDER — CALCIUM-VITAMIN D 500-200 MG-UNIT PO TABS
1.00 | ORAL_TABLET | ORAL | Status: DC
Start: 2017-12-13 — End: 2017-12-12

## 2017-12-14 ENCOUNTER — Other Ambulatory Visit: Payer: Self-pay | Admitting: Family Medicine

## 2017-12-15 ENCOUNTER — Other Ambulatory Visit: Payer: Self-pay

## 2017-12-15 MED ORDER — GLIPIZIDE ER 2.5 MG PO TB24: 2.5000 mg | ORAL_TABLET | Freq: Every day | ORAL | 1 refills | Status: DC

## 2017-12-15 NOTE — Telephone Encounter (Signed)
Refilled medication

## 2017-12-24 ENCOUNTER — Other Ambulatory Visit: Payer: Self-pay | Admitting: Family Medicine

## 2017-12-24 DIAGNOSIS — I10 Essential (primary) hypertension: Secondary | ICD-10-CM

## 2017-12-26 NOTE — Telephone Encounter (Signed)
[facility] in [facility] requesting refill on name brand Nexium.   Not on med list, but there is note from where PA was done for this med back in 2018.  Please advise if OK for refill. RX pended.  Thanks!

## 2018-01-13 ENCOUNTER — Other Ambulatory Visit: Payer: Self-pay | Admitting: Family Medicine

## 2018-01-19 DIAGNOSIS — J9621 Acute and chronic respiratory failure with hypoxia: Secondary | ICD-10-CM | POA: Diagnosis not present

## 2018-01-19 DIAGNOSIS — J441 Chronic obstructive pulmonary disease with (acute) exacerbation: Secondary | ICD-10-CM | POA: Diagnosis not present

## 2018-01-19 DIAGNOSIS — R079 Chest pain, unspecified: Secondary | ICD-10-CM | POA: Diagnosis not present

## 2018-01-19 DIAGNOSIS — J439 Emphysema, unspecified: Secondary | ICD-10-CM | POA: Diagnosis not present

## 2018-01-19 DIAGNOSIS — I10 Essential (primary) hypertension: Secondary | ICD-10-CM | POA: Diagnosis not present

## 2018-01-19 DIAGNOSIS — R0602 Shortness of breath: Secondary | ICD-10-CM | POA: Diagnosis not present

## 2018-01-19 DIAGNOSIS — Z87891 Personal history of nicotine dependence: Secondary | ICD-10-CM | POA: Diagnosis not present

## 2018-01-19 DIAGNOSIS — I480 Paroxysmal atrial fibrillation: Secondary | ICD-10-CM | POA: Diagnosis not present

## 2018-01-19 DIAGNOSIS — E119 Type 2 diabetes mellitus without complications: Secondary | ICD-10-CM | POA: Diagnosis not present

## 2018-01-19 DIAGNOSIS — E785 Hyperlipidemia, unspecified: Secondary | ICD-10-CM | POA: Diagnosis not present

## 2018-01-19 DIAGNOSIS — R0789 Other chest pain: Secondary | ICD-10-CM | POA: Diagnosis not present

## 2018-01-20 DIAGNOSIS — I10 Essential (primary) hypertension: Secondary | ICD-10-CM | POA: Diagnosis not present

## 2018-01-20 DIAGNOSIS — R000 Tachycardia, unspecified: Secondary | ICD-10-CM | POA: Diagnosis not present

## 2018-01-20 DIAGNOSIS — Z9981 Dependence on supplemental oxygen: Secondary | ICD-10-CM | POA: Diagnosis not present

## 2018-01-20 DIAGNOSIS — Z87891 Personal history of nicotine dependence: Secondary | ICD-10-CM | POA: Diagnosis not present

## 2018-01-20 DIAGNOSIS — E119 Type 2 diabetes mellitus without complications: Secondary | ICD-10-CM | POA: Diagnosis not present

## 2018-01-20 DIAGNOSIS — R0602 Shortness of breath: Secondary | ICD-10-CM | POA: Diagnosis not present

## 2018-01-20 DIAGNOSIS — E785 Hyperlipidemia, unspecified: Secondary | ICD-10-CM | POA: Diagnosis not present

## 2018-01-20 DIAGNOSIS — I480 Paroxysmal atrial fibrillation: Secondary | ICD-10-CM | POA: Diagnosis not present

## 2018-01-20 DIAGNOSIS — J9621 Acute and chronic respiratory failure with hypoxia: Secondary | ICD-10-CM | POA: Diagnosis not present

## 2018-01-20 DIAGNOSIS — I493 Ventricular premature depolarization: Secondary | ICD-10-CM | POA: Diagnosis not present

## 2018-01-20 DIAGNOSIS — J439 Emphysema, unspecified: Secondary | ICD-10-CM | POA: Diagnosis not present

## 2018-01-21 DIAGNOSIS — J9621 Acute and chronic respiratory failure with hypoxia: Secondary | ICD-10-CM | POA: Diagnosis not present

## 2018-01-21 DIAGNOSIS — I480 Paroxysmal atrial fibrillation: Secondary | ICD-10-CM | POA: Diagnosis not present

## 2018-01-21 DIAGNOSIS — J439 Emphysema, unspecified: Secondary | ICD-10-CM | POA: Diagnosis not present

## 2018-01-21 DIAGNOSIS — I10 Essential (primary) hypertension: Secondary | ICD-10-CM | POA: Diagnosis not present

## 2018-01-21 DIAGNOSIS — E119 Type 2 diabetes mellitus without complications: Secondary | ICD-10-CM | POA: Diagnosis not present

## 2018-01-21 DIAGNOSIS — E785 Hyperlipidemia, unspecified: Secondary | ICD-10-CM | POA: Diagnosis not present

## 2018-01-24 ENCOUNTER — Other Ambulatory Visit: Payer: Self-pay | Admitting: Family Medicine

## 2018-01-27 ENCOUNTER — Other Ambulatory Visit: Payer: Self-pay | Admitting: Family Medicine

## 2018-02-09 ENCOUNTER — Ambulatory Visit: Payer: Medicare Other | Admitting: Family Medicine

## 2018-02-16 DIAGNOSIS — J209 Acute bronchitis, unspecified: Secondary | ICD-10-CM | POA: Diagnosis not present

## 2018-02-16 DIAGNOSIS — N179 Acute kidney failure, unspecified: Secondary | ICD-10-CM | POA: Diagnosis not present

## 2018-02-16 DIAGNOSIS — R062 Wheezing: Secondary | ICD-10-CM | POA: Diagnosis not present

## 2018-02-16 DIAGNOSIS — F341 Dysthymic disorder: Secondary | ICD-10-CM | POA: Diagnosis not present

## 2018-02-16 DIAGNOSIS — I959 Hypotension, unspecified: Secondary | ICD-10-CM | POA: Diagnosis not present

## 2018-02-16 DIAGNOSIS — R2689 Other abnormalities of gait and mobility: Secondary | ICD-10-CM | POA: Diagnosis not present

## 2018-02-16 DIAGNOSIS — E785 Hyperlipidemia, unspecified: Secondary | ICD-10-CM | POA: Diagnosis not present

## 2018-02-16 DIAGNOSIS — R0602 Shortness of breath: Secondary | ICD-10-CM | POA: Diagnosis not present

## 2018-02-16 DIAGNOSIS — R197 Diarrhea, unspecified: Secondary | ICD-10-CM | POA: Diagnosis not present

## 2018-02-16 DIAGNOSIS — J441 Chronic obstructive pulmonary disease with (acute) exacerbation: Secondary | ICD-10-CM | POA: Diagnosis not present

## 2018-02-16 DIAGNOSIS — R05 Cough: Secondary | ICD-10-CM | POA: Diagnosis not present

## 2018-02-16 DIAGNOSIS — E860 Dehydration: Secondary | ICD-10-CM | POA: Diagnosis not present

## 2018-02-16 DIAGNOSIS — E1136 Type 2 diabetes mellitus with diabetic cataract: Secondary | ICD-10-CM | POA: Diagnosis not present

## 2018-02-16 DIAGNOSIS — R0689 Other abnormalities of breathing: Secondary | ICD-10-CM | POA: Diagnosis not present

## 2018-02-16 DIAGNOSIS — Z87891 Personal history of nicotine dependence: Secondary | ICD-10-CM | POA: Diagnosis not present

## 2018-02-17 DIAGNOSIS — R000 Tachycardia, unspecified: Secondary | ICD-10-CM | POA: Diagnosis not present

## 2018-02-17 DIAGNOSIS — J40 Bronchitis, not specified as acute or chronic: Secondary | ICD-10-CM | POA: Diagnosis not present

## 2018-02-17 DIAGNOSIS — R197 Diarrhea, unspecified: Secondary | ICD-10-CM | POA: Insufficient documentation

## 2018-02-17 DIAGNOSIS — N179 Acute kidney failure, unspecified: Secondary | ICD-10-CM | POA: Diagnosis not present

## 2018-02-17 DIAGNOSIS — I10 Essential (primary) hypertension: Secondary | ICD-10-CM | POA: Diagnosis not present

## 2018-02-17 DIAGNOSIS — Z79899 Other long term (current) drug therapy: Secondary | ICD-10-CM | POA: Diagnosis not present

## 2018-02-17 DIAGNOSIS — R0602 Shortness of breath: Secondary | ICD-10-CM | POA: Diagnosis not present

## 2018-02-17 DIAGNOSIS — R9431 Abnormal electrocardiogram [ECG] [EKG]: Secondary | ICD-10-CM | POA: Insufficient documentation

## 2018-02-17 DIAGNOSIS — I493 Ventricular premature depolarization: Secondary | ICD-10-CM | POA: Diagnosis not present

## 2018-02-17 DIAGNOSIS — E785 Hyperlipidemia, unspecified: Secondary | ICD-10-CM | POA: Diagnosis not present

## 2018-02-17 DIAGNOSIS — I480 Paroxysmal atrial fibrillation: Secondary | ICD-10-CM | POA: Diagnosis not present

## 2018-02-17 DIAGNOSIS — E119 Type 2 diabetes mellitus without complications: Secondary | ICD-10-CM | POA: Diagnosis not present

## 2018-02-18 DIAGNOSIS — E119 Type 2 diabetes mellitus without complications: Secondary | ICD-10-CM | POA: Diagnosis not present

## 2018-02-18 DIAGNOSIS — E860 Dehydration: Secondary | ICD-10-CM | POA: Diagnosis present

## 2018-02-18 DIAGNOSIS — I10 Essential (primary) hypertension: Secondary | ICD-10-CM | POA: Diagnosis not present

## 2018-02-18 DIAGNOSIS — N179 Acute kidney failure, unspecified: Secondary | ICD-10-CM | POA: Diagnosis not present

## 2018-02-18 DIAGNOSIS — R0602 Shortness of breath: Secondary | ICD-10-CM | POA: Diagnosis not present

## 2018-02-18 DIAGNOSIS — E1136 Type 2 diabetes mellitus with diabetic cataract: Secondary | ICD-10-CM | POA: Diagnosis present

## 2018-02-18 DIAGNOSIS — J441 Chronic obstructive pulmonary disease with (acute) exacerbation: Secondary | ICD-10-CM | POA: Diagnosis not present

## 2018-02-18 DIAGNOSIS — Z9981 Dependence on supplemental oxygen: Secondary | ICD-10-CM | POA: Diagnosis not present

## 2018-02-18 DIAGNOSIS — I700 Atherosclerosis of aorta: Secondary | ICD-10-CM | POA: Diagnosis present

## 2018-02-18 DIAGNOSIS — J9601 Acute respiratory failure with hypoxia: Secondary | ICD-10-CM | POA: Diagnosis not present

## 2018-02-18 DIAGNOSIS — Z794 Long term (current) use of insulin: Secondary | ICD-10-CM | POA: Diagnosis not present

## 2018-02-18 DIAGNOSIS — E785 Hyperlipidemia, unspecified: Secondary | ICD-10-CM | POA: Diagnosis not present

## 2018-02-18 DIAGNOSIS — R197 Diarrhea, unspecified: Secondary | ICD-10-CM | POA: Diagnosis not present

## 2018-02-18 DIAGNOSIS — I480 Paroxysmal atrial fibrillation: Secondary | ICD-10-CM | POA: Diagnosis not present

## 2018-02-20 MED ORDER — IPRATROPIUM BROMIDE 0.02 % IN SOLN
0.50 | RESPIRATORY_TRACT | Status: DC
Start: 2018-02-20 — End: 2018-02-20

## 2018-02-20 MED ORDER — METOPROLOL TARTRATE 50 MG PO TABS
100.00 | ORAL_TABLET | ORAL | Status: DC
Start: 2018-02-20 — End: 2018-02-20

## 2018-02-20 MED ORDER — COLESTIPOL HCL 1 G PO TABS
1.00 g | ORAL_TABLET | ORAL | Status: DC
Start: 2018-02-20 — End: 2018-02-20

## 2018-02-20 MED ORDER — ATORVASTATIN CALCIUM 40 MG PO TABS
20.00 | ORAL_TABLET | ORAL | Status: DC
Start: 2018-02-20 — End: 2018-02-20

## 2018-02-20 MED ORDER — ALBUTEROL SULFATE (2.5 MG/3ML) 0.083% IN NEBU
2.50 | INHALATION_SOLUTION | RESPIRATORY_TRACT | Status: DC
Start: ? — End: 2018-02-20

## 2018-02-20 MED ORDER — ASPIRIN EC 81 MG PO TBEC
81.00 | DELAYED_RELEASE_TABLET | ORAL | Status: DC
Start: 2018-02-21 — End: 2018-02-20

## 2018-02-20 MED ORDER — GENERIC EXTERNAL MEDICATION
1.00 | Status: DC
Start: ? — End: 2018-02-20

## 2018-02-20 MED ORDER — INSULIN LISPRO 100 UNIT/ML ~~LOC~~ SOLN
2.00 | SUBCUTANEOUS | Status: DC
Start: 2018-02-20 — End: 2018-02-20

## 2018-02-20 MED ORDER — PANTOPRAZOLE SODIUM 40 MG PO TBEC
40.00 | DELAYED_RELEASE_TABLET | ORAL | Status: DC
Start: 2018-02-21 — End: 2018-02-20

## 2018-02-20 MED ORDER — DEXTROSE 50 % IV SOLN
12.00 g | INTRAVENOUS | Status: DC
Start: ? — End: 2018-02-20

## 2018-02-20 MED ORDER — BENZONATATE 100 MG PO CAPS
100.00 | ORAL_CAPSULE | ORAL | Status: DC
Start: ? — End: 2018-02-20

## 2018-02-20 MED ORDER — ACETAMINOPHEN 325 MG PO TABS
650.00 | ORAL_TABLET | ORAL | Status: DC
Start: ? — End: 2018-02-20

## 2018-02-20 MED ORDER — SODIUM CHLORIDE 0.9 % IV SOLN
INTRAVENOUS | Status: DC
Start: ? — End: 2018-02-20

## 2018-02-20 MED ORDER — ALBUTEROL SULFATE HFA 108 (90 BASE) MCG/ACT IN AERS
2.00 | INHALATION_SPRAY | RESPIRATORY_TRACT | Status: DC
Start: ? — End: 2018-02-20

## 2018-02-20 MED ORDER — GUAIFENESIN 100 MG/5ML PO SYRP
200.00 | ORAL_SOLUTION | ORAL | Status: DC
Start: ? — End: 2018-02-20

## 2018-02-20 MED ORDER — GLUCOSE 40 % PO GEL
15.00 g | ORAL | Status: DC
Start: ? — End: 2018-02-20

## 2018-02-20 MED ORDER — ALBUTEROL SULFATE (5 MG/ML) 0.5% IN NEBU
2.50 | INHALATION_SOLUTION | RESPIRATORY_TRACT | Status: DC
Start: 2018-02-20 — End: 2018-02-20

## 2018-02-20 MED ORDER — HEPARIN SODIUM (PORCINE) 5000 UNIT/ML IJ SOLN
5000.00 | INTRAMUSCULAR | Status: DC
Start: 2018-02-20 — End: 2018-02-20

## 2018-02-20 MED ORDER — METHYLPREDNISOLONE SODIUM SUCC 125 MG IJ SOLR
60.00 | INTRAMUSCULAR | Status: DC
Start: 2018-02-20 — End: 2018-02-20

## 2018-02-22 ENCOUNTER — Inpatient Hospital Stay: Payer: Medicare Other | Admitting: Family Medicine

## 2018-02-23 ENCOUNTER — Ambulatory Visit: Payer: Medicare Other | Admitting: Family Medicine

## 2018-03-02 ENCOUNTER — Other Ambulatory Visit: Payer: Self-pay | Admitting: Family Medicine

## 2018-03-13 ENCOUNTER — Other Ambulatory Visit: Payer: Self-pay | Admitting: Family Medicine

## 2018-03-16 ENCOUNTER — Ambulatory Visit: Payer: Medicare Other | Admitting: Family Medicine

## 2018-03-17 ENCOUNTER — Other Ambulatory Visit: Payer: Self-pay | Admitting: Family Medicine

## 2018-04-03 ENCOUNTER — Other Ambulatory Visit: Payer: Self-pay | Admitting: Family Medicine

## 2018-04-14 ENCOUNTER — Other Ambulatory Visit: Payer: Self-pay | Admitting: Family Medicine

## 2018-04-20 DIAGNOSIS — R05 Cough: Secondary | ICD-10-CM | POA: Diagnosis not present

## 2018-04-20 DIAGNOSIS — Z87891 Personal history of nicotine dependence: Secondary | ICD-10-CM | POA: Diagnosis not present

## 2018-04-20 DIAGNOSIS — Z743 Need for continuous supervision: Secondary | ICD-10-CM | POA: Diagnosis not present

## 2018-04-20 DIAGNOSIS — R079 Chest pain, unspecified: Secondary | ICD-10-CM | POA: Diagnosis not present

## 2018-04-20 DIAGNOSIS — R279 Unspecified lack of coordination: Secondary | ICD-10-CM | POA: Diagnosis not present

## 2018-04-20 DIAGNOSIS — R197 Diarrhea, unspecified: Secondary | ICD-10-CM | POA: Diagnosis not present

## 2018-04-20 DIAGNOSIS — R069 Unspecified abnormalities of breathing: Secondary | ICD-10-CM | POA: Diagnosis not present

## 2018-04-20 DIAGNOSIS — R000 Tachycardia, unspecified: Secondary | ICD-10-CM | POA: Diagnosis not present

## 2018-04-20 DIAGNOSIS — J441 Chronic obstructive pulmonary disease with (acute) exacerbation: Secondary | ICD-10-CM | POA: Diagnosis not present

## 2018-04-20 DIAGNOSIS — R0602 Shortness of breath: Secondary | ICD-10-CM | POA: Diagnosis not present

## 2018-04-21 DIAGNOSIS — R0602 Shortness of breath: Secondary | ICD-10-CM | POA: Diagnosis not present

## 2018-05-12 ENCOUNTER — Other Ambulatory Visit: Payer: Self-pay | Admitting: Family Medicine

## 2018-05-12 DIAGNOSIS — J441 Chronic obstructive pulmonary disease with (acute) exacerbation: Secondary | ICD-10-CM

## 2018-06-08 ENCOUNTER — Other Ambulatory Visit: Payer: Self-pay | Admitting: Family Medicine

## 2018-06-10 ENCOUNTER — Other Ambulatory Visit: Payer: Self-pay | Admitting: Family Medicine

## 2018-07-31 ENCOUNTER — Other Ambulatory Visit: Payer: Self-pay | Admitting: *Deleted

## 2018-07-31 MED ORDER — PREDNISONE 10 MG PO TABS: 10.0000 mg | ORAL_TABLET | Freq: Every day | ORAL | 3 refills | Status: DC

## 2018-09-04 DIAGNOSIS — R0602 Shortness of breath: Secondary | ICD-10-CM | POA: Diagnosis not present

## 2018-09-04 DIAGNOSIS — Z681 Body mass index (BMI) 19 or less, adult: Secondary | ICD-10-CM | POA: Diagnosis not present

## 2018-09-04 DIAGNOSIS — J441 Chronic obstructive pulmonary disease with (acute) exacerbation: Secondary | ICD-10-CM | POA: Diagnosis not present

## 2018-09-04 DIAGNOSIS — J432 Centrilobular emphysema: Secondary | ICD-10-CM | POA: Diagnosis not present

## 2018-09-04 DIAGNOSIS — J209 Acute bronchitis, unspecified: Secondary | ICD-10-CM | POA: Diagnosis not present

## 2018-09-04 DIAGNOSIS — Z87891 Personal history of nicotine dependence: Secondary | ICD-10-CM | POA: Diagnosis not present

## 2018-09-04 DIAGNOSIS — R05 Cough: Secondary | ICD-10-CM | POA: Diagnosis not present

## 2018-09-04 DIAGNOSIS — M8088XA Other osteoporosis with current pathological fracture, vertebra(e), initial encounter for fracture: Secondary | ICD-10-CM | POA: Diagnosis not present

## 2018-09-04 DIAGNOSIS — J440 Chronic obstructive pulmonary disease with acute lower respiratory infection: Secondary | ICD-10-CM | POA: Diagnosis not present

## 2018-09-04 DIAGNOSIS — S22080A Wedge compression fracture of T11-T12 vertebra, initial encounter for closed fracture: Secondary | ICD-10-CM | POA: Diagnosis not present

## 2018-09-04 DIAGNOSIS — Y998 Other external cause status: Secondary | ICD-10-CM | POA: Diagnosis not present

## 2018-09-04 DIAGNOSIS — X58XXXA Exposure to other specified factors, initial encounter: Secondary | ICD-10-CM | POA: Diagnosis not present

## 2018-09-05 DIAGNOSIS — R109 Unspecified abdominal pain: Secondary | ICD-10-CM | POA: Diagnosis present

## 2018-09-05 DIAGNOSIS — R0602 Shortness of breath: Secondary | ICD-10-CM | POA: Diagnosis not present

## 2018-09-05 DIAGNOSIS — R413 Other amnesia: Secondary | ICD-10-CM | POA: Diagnosis present

## 2018-09-05 DIAGNOSIS — J440 Chronic obstructive pulmonary disease with acute lower respiratory infection: Secondary | ICD-10-CM | POA: Diagnosis present

## 2018-09-05 DIAGNOSIS — J209 Acute bronchitis, unspecified: Secondary | ICD-10-CM | POA: Diagnosis present

## 2018-09-05 DIAGNOSIS — J441 Chronic obstructive pulmonary disease with (acute) exacerbation: Secondary | ICD-10-CM | POA: Diagnosis not present

## 2018-09-05 DIAGNOSIS — Z7982 Long term (current) use of aspirin: Secondary | ICD-10-CM | POA: Diagnosis not present

## 2018-09-05 DIAGNOSIS — Z87891 Personal history of nicotine dependence: Secondary | ICD-10-CM | POA: Diagnosis not present

## 2018-09-05 DIAGNOSIS — M8088XA Other osteoporosis with current pathological fracture, vertebra(e), initial encounter for fracture: Secondary | ICD-10-CM | POA: Diagnosis present

## 2018-09-05 DIAGNOSIS — Z681 Body mass index (BMI) 19 or less, adult: Secondary | ICD-10-CM | POA: Diagnosis not present

## 2018-09-05 DIAGNOSIS — Z7984 Long term (current) use of oral hypoglycemic drugs: Secondary | ICD-10-CM | POA: Diagnosis not present

## 2018-09-05 DIAGNOSIS — Z7951 Long term (current) use of inhaled steroids: Secondary | ICD-10-CM | POA: Diagnosis not present

## 2018-09-05 DIAGNOSIS — I10 Essential (primary) hypertension: Secondary | ICD-10-CM | POA: Diagnosis not present

## 2018-09-05 DIAGNOSIS — Z794 Long term (current) use of insulin: Secondary | ICD-10-CM | POA: Diagnosis not present

## 2018-09-05 DIAGNOSIS — Z7901 Long term (current) use of anticoagulants: Secondary | ICD-10-CM | POA: Diagnosis not present

## 2018-09-05 DIAGNOSIS — Z9981 Dependence on supplemental oxygen: Secondary | ICD-10-CM | POA: Diagnosis not present

## 2018-09-05 DIAGNOSIS — Z8719 Personal history of other diseases of the digestive system: Secondary | ICD-10-CM | POA: Diagnosis not present

## 2018-09-05 DIAGNOSIS — E785 Hyperlipidemia, unspecified: Secondary | ICD-10-CM | POA: Diagnosis not present

## 2018-09-05 DIAGNOSIS — I700 Atherosclerosis of aorta: Secondary | ICD-10-CM | POA: Diagnosis present

## 2018-09-05 DIAGNOSIS — S22080A Wedge compression fracture of T11-T12 vertebra, initial encounter for closed fracture: Secondary | ICD-10-CM | POA: Diagnosis not present

## 2018-09-05 DIAGNOSIS — E1136 Type 2 diabetes mellitus with diabetic cataract: Secondary | ICD-10-CM | POA: Diagnosis present

## 2018-09-05 DIAGNOSIS — I2510 Atherosclerotic heart disease of native coronary artery without angina pectoris: Secondary | ICD-10-CM | POA: Diagnosis present

## 2018-09-05 DIAGNOSIS — J9621 Acute and chronic respiratory failure with hypoxia: Secondary | ICD-10-CM | POA: Diagnosis not present

## 2018-09-05 DIAGNOSIS — Z79899 Other long term (current) drug therapy: Secondary | ICD-10-CM | POA: Diagnosis not present

## 2018-09-05 DIAGNOSIS — R0902 Hypoxemia: Secondary | ICD-10-CM | POA: Diagnosis present

## 2018-09-05 DIAGNOSIS — H409 Unspecified glaucoma: Secondary | ICD-10-CM | POA: Diagnosis present

## 2018-09-05 DIAGNOSIS — K5900 Constipation, unspecified: Secondary | ICD-10-CM | POA: Diagnosis not present

## 2018-09-05 DIAGNOSIS — I480 Paroxysmal atrial fibrillation: Secondary | ICD-10-CM | POA: Diagnosis not present

## 2018-09-05 DIAGNOSIS — R636 Underweight: Secondary | ICD-10-CM | POA: Diagnosis present

## 2018-09-05 DIAGNOSIS — E119 Type 2 diabetes mellitus without complications: Secondary | ICD-10-CM | POA: Diagnosis not present

## 2018-09-05 DIAGNOSIS — R9431 Abnormal electrocardiogram [ECG] [EKG]: Secondary | ICD-10-CM | POA: Diagnosis not present

## 2018-09-06 MED ORDER — ALPRAZOLAM 0.25 MG PO TABS
0.25 | ORAL_TABLET | ORAL | Status: DC
Start: ? — End: 2018-09-06

## 2018-09-06 MED ORDER — DOXYCYCLINE HYCLATE 100 MG PO TABS
100.00 | ORAL_TABLET | ORAL | Status: DC
Start: 2018-09-06 — End: 2018-09-06

## 2018-09-06 MED ORDER — LISINOPRIL 5 MG PO TABS
5.00 | ORAL_TABLET | ORAL | Status: DC
Start: 2018-09-07 — End: 2018-09-06

## 2018-09-06 MED ORDER — IPRATROPIUM BROMIDE 0.02 % IN SOLN
.50 | RESPIRATORY_TRACT | Status: DC
Start: 2018-09-06 — End: 2018-09-06

## 2018-09-06 MED ORDER — GLUCOSE 40 % PO GEL
15.00 | ORAL | Status: DC
Start: ? — End: 2018-09-06

## 2018-09-06 MED ORDER — ASPIRIN EC 81 MG PO TBEC
81.00 | DELAYED_RELEASE_TABLET | ORAL | Status: DC
Start: 2018-09-07 — End: 2018-09-06

## 2018-09-06 MED ORDER — INSULIN LISPRO 100 UNIT/ML ~~LOC~~ SOLN
1.00 | SUBCUTANEOUS | Status: DC
Start: 2018-09-06 — End: 2018-09-06

## 2018-09-06 MED ORDER — ALBUTEROL SULFATE 2.5 MG/0.5ML IN NEBU
2.50 | INHALATION_SOLUTION | RESPIRATORY_TRACT | Status: DC
Start: 2018-09-06 — End: 2018-09-06

## 2018-09-06 MED ORDER — ATORVASTATIN CALCIUM 10 MG PO TABS
20.00 | ORAL_TABLET | ORAL | Status: DC
Start: 2018-09-06 — End: 2018-09-06

## 2018-09-06 MED ORDER — ONDANSETRON 4 MG PO TBDP
4.00 | ORAL_TABLET | ORAL | Status: DC
Start: ? — End: 2018-09-06

## 2018-09-06 MED ORDER — METOPROLOL TARTRATE 50 MG PO TABS
100.00 | ORAL_TABLET | ORAL | Status: DC
Start: 2018-09-06 — End: 2018-09-06

## 2018-09-06 MED ORDER — PANTOPRAZOLE SODIUM 40 MG PO TBEC
40.00 | DELAYED_RELEASE_TABLET | ORAL | Status: DC
Start: 2018-09-07 — End: 2018-09-06

## 2018-09-06 MED ORDER — GLUCAGON HCL RDNA (DIAGNOSTIC) 1 MG IJ SOLR
1.00 | INTRAMUSCULAR | Status: DC
Start: ? — End: 2018-09-06

## 2018-09-06 MED ORDER — BENZONATATE 100 MG PO CAPS
100.00 | ORAL_CAPSULE | ORAL | Status: DC
Start: ? — End: 2018-09-06

## 2018-09-06 MED ORDER — ENOXAPARIN SODIUM 40 MG/0.4ML ~~LOC~~ SOLN
40.00 | SUBCUTANEOUS | Status: DC
Start: 2018-09-06 — End: 2018-09-06

## 2018-09-06 MED ORDER — ONDANSETRON HCL 4 MG/2ML IJ SOLN
4.00 | INTRAMUSCULAR | Status: DC
Start: ? — End: 2018-09-06

## 2018-09-06 MED ORDER — ALBUTEROL SULFATE (2.5 MG/3ML) 0.083% IN NEBU
2.50 | INHALATION_SOLUTION | RESPIRATORY_TRACT | Status: DC
Start: ? — End: 2018-09-06

## 2018-09-06 MED ORDER — ACETAMINOPHEN 325 MG PO TABS
650.00 | ORAL_TABLET | ORAL | Status: DC
Start: ? — End: 2018-09-06

## 2018-09-06 MED ORDER — DEXTROSE 10 % IV SOLN
125.00 | INTRAVENOUS | Status: DC
Start: ? — End: 2018-09-06

## 2018-09-06 MED ORDER — SODIUM CHLORIDE 0.9 % IV SOLN
INTRAVENOUS | Status: DC
Start: ? — End: 2018-09-06

## 2018-09-06 MED ORDER — PREDNISONE 20 MG PO TABS
40.00 | ORAL_TABLET | ORAL | Status: DC
Start: 2018-09-07 — End: 2018-09-06

## 2018-09-15 ENCOUNTER — Other Ambulatory Visit: Payer: Self-pay | Admitting: *Deleted

## 2018-09-15 ENCOUNTER — Other Ambulatory Visit: Payer: Self-pay | Admitting: Family Medicine

## 2018-09-15 DIAGNOSIS — E119 Type 2 diabetes mellitus without complications: Secondary | ICD-10-CM

## 2018-09-15 MED ORDER — GLIPIZIDE ER 2.5 MG PO TB24: 2.5000 mg | ORAL_TABLET | Freq: Every day | ORAL | 0 refills | Status: DC

## 2018-10-03 ENCOUNTER — Other Ambulatory Visit: Payer: Self-pay | Admitting: Family Medicine

## 2018-10-11 ENCOUNTER — Other Ambulatory Visit: Payer: Self-pay | Admitting: Family Medicine

## 2018-10-11 DIAGNOSIS — E119 Type 2 diabetes mellitus without complications: Secondary | ICD-10-CM

## 2018-10-15 ENCOUNTER — Other Ambulatory Visit: Payer: Self-pay | Admitting: Family Medicine

## 2018-10-15 DIAGNOSIS — E119 Type 2 diabetes mellitus without complications: Secondary | ICD-10-CM

## 2018-11-08 ENCOUNTER — Other Ambulatory Visit: Payer: Self-pay | Admitting: Family Medicine

## 2018-11-12 ENCOUNTER — Other Ambulatory Visit: Payer: Self-pay | Admitting: Family Medicine

## 2018-11-14 ENCOUNTER — Other Ambulatory Visit: Payer: Self-pay | Admitting: Family Medicine

## 2018-11-14 DIAGNOSIS — E119 Type 2 diabetes mellitus without complications: Secondary | ICD-10-CM

## 2018-11-16 ENCOUNTER — Ambulatory Visit (INDEPENDENT_AMBULATORY_CARE_PROVIDER_SITE_OTHER): Payer: Medicare Other

## 2018-11-16 ENCOUNTER — Ambulatory Visit (INDEPENDENT_AMBULATORY_CARE_PROVIDER_SITE_OTHER): Payer: Medicare Other | Admitting: Family Medicine

## 2018-11-16 ENCOUNTER — Encounter: Payer: Self-pay | Admitting: Family Medicine

## 2018-11-16 ENCOUNTER — Other Ambulatory Visit: Payer: Self-pay

## 2018-11-16 VITALS — BP 93/67 | HR 82 | Temp 98.4°F | Ht 71.0 in | Wt 143.0 lb

## 2018-11-16 DIAGNOSIS — J441 Chronic obstructive pulmonary disease with (acute) exacerbation: Secondary | ICD-10-CM | POA: Diagnosis not present

## 2018-11-16 DIAGNOSIS — I700 Atherosclerosis of aorta: Secondary | ICD-10-CM

## 2018-11-16 DIAGNOSIS — R0602 Shortness of breath: Secondary | ICD-10-CM

## 2018-11-16 DIAGNOSIS — B370 Candidal stomatitis: Secondary | ICD-10-CM | POA: Diagnosis not present

## 2018-11-16 DIAGNOSIS — R29898 Other symptoms and signs involving the musculoskeletal system: Secondary | ICD-10-CM | POA: Diagnosis not present

## 2018-11-16 DIAGNOSIS — E119 Type 2 diabetes mellitus without complications: Secondary | ICD-10-CM | POA: Diagnosis not present

## 2018-11-16 DIAGNOSIS — R059 Cough, unspecified: Secondary | ICD-10-CM

## 2018-11-16 DIAGNOSIS — R05 Cough: Secondary | ICD-10-CM | POA: Diagnosis not present

## 2018-11-16 DIAGNOSIS — I10 Essential (primary) hypertension: Secondary | ICD-10-CM

## 2018-11-16 LAB — POCT INFLUENZA A/B
Influenza A, POC: NEGATIVE
Influenza B, POC: NEGATIVE

## 2018-11-16 MED ORDER — BUDESONIDE-FORMOTEROL FUMARATE 160-4.5 MCG/ACT IN AERO: 2.0000 | INHALATION_SPRAY | Freq: Two times a day (BID) | RESPIRATORY_TRACT | 1 refills | Status: DC

## 2018-11-16 MED ORDER — PRAVASTATIN SODIUM 20 MG PO TABS: 20.0000 mg | ORAL_TABLET | Freq: Every day | ORAL | 3 refills | Status: DC

## 2018-11-16 MED ORDER — AMBULATORY NON FORMULARY MEDICATION: 0 refills | Status: DC

## 2018-11-16 MED ORDER — NYSTATIN 100000 UNIT/ML MT SUSP: 5.0000 mL | Freq: Four times a day (QID) | OROMUCOSAL | 0 refills | Status: AC

## 2018-11-16 MED ORDER — LISINOPRIL 5 MG PO TABS: 5.0000 mg | ORAL_TABLET | Freq: Every day | ORAL | 1 refills | Status: DC

## 2018-11-16 MED ORDER — TIOTROPIUM BROMIDE MONOHYDRATE 18 MCG IN CAPS: 18.0000 ug | ORAL_CAPSULE | Freq: Every day | RESPIRATORY_TRACT | 1 refills | Status: DC

## 2018-11-16 MED ORDER — NYSTATIN 100000 UNIT/ML MT SUSP: 5.0000 mL | Freq: Four times a day (QID) | OROMUCOSAL | 0 refills | Status: DC

## 2018-11-16 MED ORDER — ALBUTEROL SULFATE (2.5 MG/3ML) 0.083% IN NEBU
2.5000 mg | INHALATION_SOLUTION | Freq: Once | RESPIRATORY_TRACT | Status: AC
  Administered 2018-11-16: 2.5 mg via RESPIRATORY_TRACT

## 2018-11-16 MED ORDER — GLIPIZIDE ER 2.5 MG PO TB24: ORAL_TABLET | ORAL | 1 refills | Status: DC

## 2018-11-16 MED ORDER — METHYLPREDNISOLONE SODIUM SUCC 125 MG IJ SOLR
125.0000 mg | Freq: Once | INTRAMUSCULAR | Status: AC
  Administered 2018-11-16: 125 mg via INTRAMUSCULAR

## 2018-11-16 MED ORDER — NEXIUM 20 MG PO CPDR: DELAYED_RELEASE_CAPSULE | ORAL | 3 refills | Status: DC

## 2018-11-16 MED ORDER — PREDNISONE 10 MG PO TABS: 10.0000 mg | ORAL_TABLET | Freq: Every day | ORAL | 1 refills | Status: DC

## 2018-11-16 MED ORDER — DOXYCYCLINE HYCLATE 100 MG PO TABS: 100.0000 mg | ORAL_TABLET | Freq: Two times a day (BID) | ORAL | 0 refills | Status: DC

## 2018-11-16 MED ORDER — METOPROLOL TARTRATE 100 MG PO TABS: 100.0000 mg | ORAL_TABLET | Freq: Two times a day (BID) | ORAL | 0 refills | Status: DC

## 2018-11-16 NOTE — Assessment & Plan Note (Signed)
Overdue for diabetic care so we will go ahead and just get an A1c on his blood work and address that way.  He is on prednisone very frequently

## 2018-11-16 NOTE — Assessment & Plan Note (Signed)
Continue with home oxygen.  Given 125 mg of IM Solu-Medrol.  Also given a prescription for prednisone taper.  Also sent over prescription for doxycycline.  Nebulizer treatment here in the office will evaluate for pneumonia with chest x-ray and also do a CBC with differential.

## 2018-11-16 NOTE — Addendum Note (Signed)
Addended by: Teddy Spike on: 11/16/2018 02:28 PM   Modules accepted: Orders

## 2018-11-16 NOTE — Progress Notes (Signed)
Acute Office Visit  Subjective:    Patient ID: Jared Lee, male    DOB: 06-02-1941, 78 y.o.   MRN: [id number]  Chief Complaint  Patient presents with  . Cough    HPI Patient is in today for shortness of breath and cough.  He has been gradually getting more weak over the last several months.  His son-in-law who is here with him today reports that he is actually been walking very little over the last 3 months even just a few steps and he is extremely winded and short of breath.  He is on chronic oxygen therapy.  He is currently on 2 L.  He has had frequent and recurrent admissions to the hospital for COPD exacerbations.  His last one was around January 6.  Was also diagnosed with a compression fracture at T12 vertebrae at that time as well.  He did have a CT during admission that showed severe emphysema with scarring at the bases.  He has primarily been at home and has not traveled outside of the home.  There are only 2 other family members in the house and there is really only 1 of which that he has contact with which is the son-in-law.  He has not been sick himself.  Patient denies any fevers or chills.  He has had cough and increased sputum production over the last 3 days.  He has had an intermittent sore throat he denies any ear pain.  And some intermittent abdominal discomfort.  Past Medical History:  Diagnosis Date  . COPD (chronic obstructive pulmonary disease) ([place])   . H/O colonoscopy 02/03/12   EGD as well. Mildy nodular gastritis.    . Hyperlipemia   . Hypertension   . Normal cardiac stress test 04/14/12   [facility]    Past Surgical History:  Procedure Laterality Date  . CATARACT EXTRACTION Right 03/15/2016  . CATARACT EXTRACTION W/ INTRAOCULAR LENS IMPLANT Left 04/05/2016  . IR GENERIC HISTORICAL  08/04/2016   IR RADIOLOGIST EVAL & MGMT 08/04/2016 [name], MD [facility] INTERV RAD    Family History  Problem Relation Age of Onset  . Heart disease Father   .  Hypertension Father   . Emphysema Father   . Emphysema Paternal Grandfather     Social History   Socioeconomic History  . Marital status: Single    Spouse name: Not on file  . Number of children: Not on file  . Years of education: Not on file  . Highest education level: Not on file  Occupational History  . Not on file  Social Needs  . Financial resource strain: Not on file  . Food insecurity:    Worry: Not on file    Inability: Not on file  . Transportation needs:    Medical: Not on file    Non-medical: Not on file  Tobacco Use  . Smoking status: Former Smoker    Packs/day: 1.00    Years: 66.00    Pack years: 66.00    Types: Cigarettes    Last attempt to quit: 08/31/2011    Years since quitting: 7.2  . Smokeless tobacco: Never Used  Substance and Sexual Activity  . Alcohol use: No  . Drug use: No  . Sexual activity: Not on file  Lifestyle  . Physical activity:    Days per week: Not on file    Minutes per session: Not on file  . Stress: Not on file  Relationships  . Social  connections:    Talks on phone: Not on file    Gets together: Not on file    Attends religious service: Not on file    Active member of club or organization: Not on file    Attends meetings of clubs or organizations: Not on file    Relationship status: Not on file  . Intimate partner violence:    Fear of current or ex partner: Not on file    Emotionally abused: Not on file    Physically abused: Not on file    Forced sexual activity: Not on file  Other Topics Concern  . Not on file  Social History Narrative  . Not on file    Outpatient Medications Prior to Visit  Medication Sig Dispense Refill  . albuterol (PROVENTIL) (2.5 MG/3ML) 0.083% nebulizer solution USE 1 VIAL IN NEBULIZER EVERY 4 HOURS AS NEEDED FOR WHEEZING AND FOR SHORTNESS OF BREATH 525 vial 5  . AMBULATORY NON FORMULARY MEDICATION Medication Name: Ambulatory oxygen cocentrator.  Pt is mobile within th home.  Pt requests  portable concentrator. Using [employer] for oxygen needs. 1 Units 0  . AMBULATORY NON FORMULARY MEDICATION Medication Name: One Touch Ultra Test Strips for BID Testing Daily. Dx Code: 250.00  Dx: Type II or unspecified type diabetes mellitus without mention of complication, not stated as uncontrolled (250.00) 100 each 2  . aspirin 81 MG tablet Take 81 mg by mouth daily.    . Blood Glucose Monitoring Suppl (BLOOD GLUCOSE MONITOR SYSTEM) W/DEVICE KIT Check twice a day. 1 each 0  . Calcium Carb-Cholecalciferol (CALCIUM-VITAMIN D) 500-200 MG-UNIT tablet Take 1 tablet by mouth daily.    . colestipol (COLESTID) 1 g tablet TAKE 1 TABLET BY MOUTH TWICE DAILY AS NEEDED FOR DIARRHEA 180 tablet 0  . Ferrous Sulfate (IRON) 325 (65 Fe) MG TABS TAKE 1 TABLET BY MOUTH ONCE DAILY WITH BREAKFAST 90 tablet 3  . ipratropium (ATROVENT) 0.02 % nebulizer solution Take 2.5 mLs (0.5 mg total) by nebulization every 6 (six) hours as needed for wheezing or shortness of breath. Dx COPD. ICD-9 Code 496. 75 mL 12  . VENTOLIN HFA 108 (90 Base) MCG/ACT inhaler INHALE 2 PUFFS BY MOUTH EVERY 6 HOURS AS NEEDED FOR WHEEZING OR  SHORTNESS  OF  BREATH 54 each 3  . ALPRAZolam (XANAX) 0.25 MG tablet Take 0.25 mg by mouth.    . budesonide-formoterol (SYMBICORT) 160-4.5 MCG/ACT inhaler Inhale 2 puffs into the lungs 2 (two) times daily. 1 Inhaler 6  . glipiZIDE (GLUCOTROL XL) 2.5 MG 24 hr tablet TAKE 1 TABLET BY MOUTH IN THE MORNING WITH BREAKFAST ----  FINAL  REFILL  -  APPOINTMENT  REQUIRED  ------ 30 tablet 0  . lisinopril (PRINIVIL,ZESTRIL) 5 MG tablet TAKE 1 TABLET BY MOUTH ONCE DAILY 90 tablet 0  . metoprolol tartrate (LOPRESSOR) 100 MG tablet Take 1 tablet (100 mg total) by mouth 2 (two) times daily. FINAL REFILL.APPOINTMENT REQUIRED FOR REFILLS 90 tablet 0  . NEXIUM 20 MG capsule TAKE ONE CAPSULE BY MOUTH IN THE MORNING BEFORE BREAKFAST 90 capsule 3  . pantoprazole (PROTONIX) 40 MG tablet Take 1 tablet (40 mg total) by mouth daily. 30  tablet 3  . pravastatin (PRAVACHOL) 20 MG tablet TAKE 1 TABLET BY MOUTH ONCE DAILY 90 tablet 1  . predniSONE (DELTASONE) 10 MG tablet Take 1 tablet (10 mg total) by mouth daily. TAKE 4 TABLETS ONCE DAILY FOR 2 DAYS, 3 DAILY FOR 2 DAYS, 2 DAILY FOR 2 DAYS, 1  DAILY FOR 2 DAYS, UNTIL COMPLETE 30 tablet 3  . Probiotic Product (ACIDOPHILUS PROBIOTIC COMPLEX) TABS Take 1 tablet by mouth daily. 90 tablet 3  . tiotropium (SPIRIVA HANDIHALER) 18 MCG inhalation capsule Place 1 capsule (18 mcg total) into inhaler and inhale daily. 30 capsule 12  . umeclidinium bromide (INCRUSE ELLIPTA) 62.5 MCG/INH AEPB Inhale 1 puff into the lungs daily. 1 each PRN   No facility-administered medications prior to visit.     Allergies  Allergen Reactions  . Chlorpromazine Swelling  . Fluticasone-Salmeterol Swelling  . Augmentin [Amoxicillin-Pot Clavulanate] Nausea And Vomiting    Stomach pain as well.   . Azithromycin Diarrhea    severe  . Daliresp [Roflumilast] Other (See Comments)    HA, diarrhea   . Levaquin [Levofloxacin]   . Prednisone Other (See Comments)    Swelling    ROS     Objective:    Physical Exam  Constitutional: He is oriented to person, place, and time. He appears well-developed and well-nourished.  Thin and frail. Sitting in a wheelchair.   HENT:  Head: Normocephalic and atraumatic.  Right Ear: External ear normal.  Left Ear: External ear normal.  Nose: Nose normal.  Mouth/Throat: Oropharynx is clear and moist.  TMs and canals are clear.  White spots on the posterior pharynx.  Likely consistent with candidiasis.  Eyes: Pupils are equal, round, and reactive to light. Conjunctivae and EOM are normal.  Neck: Neck supple. No thyromegaly present.  Cardiovascular: Normal rate and normal heart sounds.  Pulmonary/Chest: Effort normal.  Poor breath sounds throughout.  No wheezing.  No crackles.  Lymphadenopathy:    He has no cervical adenopathy.  Neurological: He is alert and oriented to  person, place, and time.  Skin: Skin is warm and dry.  Psychiatric: He has a normal mood and affect.    BP 93/67   Pulse 82   Temp 98.4 F (36.9 C)   Ht 5' 11" (1.803 m)   Wt 143 lb (64.9 kg)   SpO2 93% Comment: 2L  BMI 19.94 kg/m  Wt Readings from Last 3 Encounters:  11/16/18 143 lb (64.9 kg)  04/22/17 133 lb (60.3 kg)  11/25/16 136 lb (61.7 kg)    Health Maintenance Due  Topic Date Due  . OPHTHALMOLOGY EXAM  04/05/1949  . INFLUENZA VACCINE  03/30/2018  . FOOT EXAM  04/22/2018  . HEMOGLOBIN A1C  05/11/2018    There are no preventive care reminders to display for this patient.   Lab Results  Component Value Date   TSH 1.436 12/31/2009   Lab Results  Component Value Date   WBC 9.4 01/22/2016   HGB 13.8 01/22/2016   HCT 42.0 01/22/2016   MCV 87.3 01/22/2016   PLT 149 01/22/2016   Lab Results  Component Value Date   NA 139 04/22/2017   K 4.9 04/22/2017   CO2 23 04/22/2017   GLUCOSE 196 (H) 04/22/2017   BUN 38 (H) 04/22/2017   CREATININE 1.04 04/22/2017   BILITOT 0.4 01/22/2016   ALKPHOS 58 01/22/2016   AST 23 01/22/2016   ALT 16 01/22/2016   PROT 6.0 (L) 01/22/2016   ALBUMIN 3.8 01/22/2016   CALCIUM 9.1 04/22/2017   Lab Results  Component Value Date   CHOL 215 (H) 01/22/2016   Lab Results  Component Value Date   HDL 94 01/22/2016   Lab Results  Component Value Date   LDLCALC 94 01/22/2016   Lab Results  Component Value Date   TRIG  137 01/22/2016   Lab Results  Component Value Date   CHOLHDL 2.3 01/22/2016   Lab Results  Component Value Date   HGBA1C 6.2 11/08/2017       Assessment & Plan:   Problem List Items Addressed This Visit      Cardiovascular and Mediastinum   HYPERTENSION, BENIGN    Nisha blood pressure was quite elevated but repeat was actually borderline low.      Relevant Medications   lisinopril (PRINIVIL,ZESTRIL) 5 MG tablet   metoprolol tartrate (LOPRESSOR) 100 MG tablet   NEXIUM 20 MG capsule   pravastatin  (PRAVACHOL) 20 MG tablet   Other Relevant Orders   CBC with Differential/Platelet   COMPLETE METABOLIC PANEL WITH GFR   Hemoglobin A1c     Respiratory   COPD exacerbation ([place]) - Primary    Continue with home oxygen.  Given 125 mg of IM Solu-Medrol.  Also given a prescription for prednisone taper.  Also sent over prescription for doxycycline.  Nebulizer treatment here in the office will evaluate for pneumonia with chest x-ray and also do a CBC with differential.      Relevant Medications   albuterol (PROVENTIL) (2.5 MG/3ML) 0.083% nebulizer solution 2.5 mg (Completed)   methylPREDNISolone sodium succinate (SOLU-MEDROL) 125 mg/2 mL injection 125 mg (Completed)   predniSONE (DELTASONE) 10 MG tablet   budesonide-formoterol (SYMBICORT) 160-4.5 MCG/ACT inhaler   tiotropium (SPIRIVA HANDIHALER) 18 MCG inhalation capsule   Other Relevant Orders   DG Chest 2 View     Endocrine   Diabetes mellitus ([place])    Overdue for diabetic care so we will go ahead and just get an A1c on his blood work and address that way.  He is on prednisone very frequently      Relevant Medications   glipiZIDE (GLUCOTROL XL) 2.5 MG 24 hr tablet   lisinopril (PRINIVIL,ZESTRIL) 5 MG tablet   pravastatin (PRAVACHOL) 20 MG tablet   Other Relevant Orders   CBC with Differential/Platelet   COMPLETE METABOLIC PANEL WITH GFR   Hemoglobin A1c    Other Visit Diagnoses    Cough       Relevant Medications   albuterol (PROVENTIL) (2.5 MG/3ML) 0.083% nebulizer solution 2.5 mg (Completed)   methylPREDNISolone sodium succinate (SOLU-MEDROL) 125 mg/2 mL injection 125 mg (Completed)   Other Relevant Orders   POCT Influenza A/B (Completed)   DG Chest 2 View   SOB (shortness of breath)       Oral candidiasis       Relevant Medications   nystatin (MYCOSTATIN) 100000 UNIT/ML suspension     Most consistent with COPD exacerbation.  He has very little contact with the outside world and really just with his son-in-law who lives  with him.  We did do a flu swab today I think it is unlikely to be covered at this point.  Given Solu-Medrol 125 mg IM as well as a nebulizer treatment here in the office.  We will have him go down for chest x-ray as well.  Oral candidiasis-we will treat with nystatin swish and swallow.  Meds ordered this encounter  Medications  . albuterol (PROVENTIL) (2.5 MG/3ML) 0.083% nebulizer solution 2.5 mg  . methylPREDNISolone sodium succinate (SOLU-MEDROL) 125 mg/2 mL injection 125 mg  . doxycycline (VIBRA-TABS) 100 MG tablet    Sig: Take 1 tablet (100 mg total) by mouth 2 (two) times daily.    Dispense:  20 tablet    Refill:  0  . predniSONE (DELTASONE) 10 MG tablet  Sig: Take 1 tablet (10 mg total) by mouth daily. TAKE 4 TABLETS ONCE DAILY FOR 2 DAYS, 3 DAILY FOR 2 DAYS, 2 DAILY FOR 2 DAYS, 1 DAILY FOR 2 DAYS, UNTIL COMPLETE    Dispense:  90 tablet    Refill:  1    Please consider 90 day supplies to promote better adherence  . budesonide-formoterol (SYMBICORT) 160-4.5 MCG/ACT inhaler    Sig: Inhale 2 puffs into the lungs 2 (two) times daily.    Dispense:  3 Inhaler    Refill:  1  . glipiZIDE (GLUCOTROL XL) 2.5 MG 24 hr tablet    Sig: TAKE 1 TABLET BY MOUTH IN THE MORNING WITH BREAKFAST------    Dispense:  90 tablet    Refill:  1  . lisinopril (PRINIVIL,ZESTRIL) 5 MG tablet    Sig: Take 1 tablet (5 mg total) by mouth daily.    Dispense:  90 tablet    Refill:  1  . metoprolol tartrate (LOPRESSOR) 100 MG tablet    Sig: Take 1 tablet (100 mg total) by mouth 2 (two) times daily.    Dispense:  180 tablet    Refill:  0  . NEXIUM 20 MG capsule    Sig: TAKE ONE CAPSULE BY MOUTH IN THE MORNING BEFORE BREAKFAST    Dispense:  90 capsule    Refill:  3    Please consider 90 day supplies to promote better adherence  . pravastatin (PRAVACHOL) 20 MG tablet    Sig: Take 1 tablet (20 mg total) by mouth daily.    Dispense:  90 tablet    Refill:  3  . tiotropium (SPIRIVA HANDIHALER) 18 MCG  inhalation capsule    Sig: Place 1 capsule (18 mcg total) into inhaler and inhale daily.    Dispense:  90 capsule    Refill:  1  . DISCONTD: nystatin (MYCOSTATIN) 100000 UNIT/ML suspension    Sig: Take 5 mLs (500,000 Units total) by mouth 4 (four) times daily for 7 days. X 1 week. Swish and hold in mouth for at least 2 minutes and then swallow.    Dispense:  473 mL    Refill:  0  . nystatin (MYCOSTATIN) 100000 UNIT/ML suspension    Sig: Take 5 mLs (500,000 Units total) by mouth 4 (four) times daily for 7 days. Swish & hold in mouth for 2 minutes, then swallow.    Dispense:  473 mL    Refill:  0     Beatrice Lecher, MD

## 2018-11-16 NOTE — Assessment & Plan Note (Signed)
Jared Lee blood pressure was quite elevated but repeat was actually borderline low.

## 2018-11-17 ENCOUNTER — Telehealth: Payer: Self-pay | Admitting: *Deleted

## 2018-11-17 LAB — COMPLETE METABOLIC PANEL WITH GFR
AG Ratio: 1.9 (calc) (ref 1.0–2.5)
ALT: 13 U/L (ref 9–46)
AST: 23 U/L (ref 10–35)
Albumin: 3.7 g/dL (ref 3.6–5.1)
Alkaline phosphatase (APISO): 63 U/L (ref 35–144)
BUN: 25 mg/dL (ref 7–25)
CO2: 30 mmol/L (ref 20–32)
Calcium: 9 mg/dL (ref 8.6–10.3)
Chloride: 103 mmol/L (ref 98–110)
Creat: 1.13 mg/dL (ref 0.70–1.18)
GFR, Est African American: 71 mL/min/{1.73_m2} (ref >=60)
GFR, Est Non African American: 61 mL/min/{1.73_m2} (ref >=60)
Globulin: 1.9 g/dL (calc) (ref 1.9–3.7)
Glucose, Bld: 152 mg/dL — ABNORMAL HIGH (ref 65–99)
Potassium: 5.6 mmol/L — ABNORMAL HIGH (ref 3.5–5.3)
Sodium: 139 mmol/L (ref 135–146)
Total Bilirubin: 0.3 mg/dL (ref 0.2–1.2)
Total Protein: 5.6 g/dL — ABNORMAL LOW (ref 6.1–8.1)

## 2018-11-17 LAB — CBC WITH DIFFERENTIAL/PLATELET
Absolute Monocytes: 551 cells/uL (ref 200–950)
Basophils Absolute: 76 cells/uL (ref 0–200)
Basophils Relative: 0.7 %
Eosinophils Absolute: 508 cells/uL — ABNORMAL HIGH (ref 15–500)
Eosinophils Relative: 4.7 %
HCT: 35.8 % — ABNORMAL LOW (ref 38.5–50.0)
Hemoglobin: 12.2 g/dL — ABNORMAL LOW (ref 13.2–17.1)
Lymphs Abs: 767 cells/uL — ABNORMAL LOW (ref 850–3900)
MCH: 29.1 pg (ref 27.0–33.0)
MCHC: 34.1 g/dL (ref 32.0–36.0)
MCV: 85.4 fL (ref 80.0–100.0)
MPV: 11.9 fL (ref 7.5–12.5)
Monocytes Relative: 5.1 %
Neutro Abs: 8899 cells/uL — ABNORMAL HIGH (ref 1500–7800)
Neutrophils Relative %: 82.4 %
Platelets: 188 10*3/uL (ref 140–400)
RBC: 4.19 10*6/uL — ABNORMAL LOW (ref 4.20–5.80)
RDW: 13.2 % (ref 11.0–15.0)
Total Lymphocyte: 7.1 %
WBC: 10.8 10*3/uL (ref 3.8–10.8)

## 2018-11-17 LAB — HEMOGLOBIN A1C
Hgb A1c MFr Bld: 5.9 % of total Hgb — ABNORMAL HIGH (ref <5.7)
Mean Plasma Glucose: 123 (calc)
eAG (mmol/L): 6.8 (calc)

## 2018-11-17 NOTE — Telephone Encounter (Signed)
Called pharmacy to verify their fax [phone]) because I have been trying to send since yesterday.   Spoke w/the pharmacist and gave a verbal for pt's predinsone 10 mg for #90.Maryruth Eve, [place] Crocker

## 2018-11-22 NOTE — Progress Notes (Signed)
Pt's son in law reports that he has not been ambulating as well and as much as he use to. He has been really weak. He said that he was supposed to get a wheelchair but never got one. He asked if Dr. Madilyn Fireman will order one for him.Maryruth Eve, [place] Crocker, CMA

## 2018-12-04 ENCOUNTER — Other Ambulatory Visit: Payer: Self-pay | Admitting: Family Medicine

## 2018-12-04 DIAGNOSIS — J441 Chronic obstructive pulmonary disease with (acute) exacerbation: Secondary | ICD-10-CM

## 2018-12-04 DIAGNOSIS — R29898 Other symptoms and signs involving the musculoskeletal system: Secondary | ICD-10-CM

## 2018-12-04 MED ORDER — AMBULATORY NON FORMULARY MEDICATION: 0 refills | Status: DC

## 2019-01-10 ENCOUNTER — Other Ambulatory Visit: Payer: Self-pay | Admitting: *Deleted

## 2019-01-10 MED ORDER — VENTOLIN HFA 108 (90 BASE) MCG/ACT IN AERS: INHALATION_SPRAY | RESPIRATORY_TRACT | 3 refills | Status: DC

## 2019-01-10 NOTE — Progress Notes (Signed)
Refill albuterol. 

## 2019-01-14 ENCOUNTER — Other Ambulatory Visit: Payer: Self-pay | Admitting: *Deleted

## 2019-01-14 DIAGNOSIS — J441 Chronic obstructive pulmonary disease with (acute) exacerbation: Secondary | ICD-10-CM

## 2019-01-14 MED ORDER — ALBUTEROL SULFATE (2.5 MG/3ML) 0.083% IN NEBU: INHALATION_SOLUTION | RESPIRATORY_TRACT | 5 refills | Status: DC

## 2019-01-21 ENCOUNTER — Other Ambulatory Visit: Payer: Self-pay | Admitting: Family Medicine

## 2019-01-24 ENCOUNTER — Other Ambulatory Visit: Payer: Self-pay | Admitting: Family Medicine

## 2019-01-26 ENCOUNTER — Other Ambulatory Visit: Payer: Self-pay | Admitting: Family Medicine

## 2019-01-26 NOTE — Telephone Encounter (Signed)
Please call pt and schedule visit for 3 month follow up (on or after 02/16/19)

## 2019-01-26 NOTE — Telephone Encounter (Signed)
Appointment has been scheduled.

## 2019-02-15 ENCOUNTER — Other Ambulatory Visit: Payer: Self-pay | Admitting: *Deleted

## 2019-02-15 MED ORDER — PREDNISONE 10 MG PO TABS: 10.0000 mg | ORAL_TABLET | Freq: Every day | ORAL | 1 refills | Status: DC

## 2019-02-16 ENCOUNTER — Other Ambulatory Visit: Payer: Self-pay | Admitting: Family Medicine

## 2019-02-20 ENCOUNTER — Telehealth: Payer: Self-pay

## 2019-02-20 NOTE — Telephone Encounter (Signed)
Jared Lee's son in law called and left a message requesting a prescription for Prednisone. I call Jared Lee to find out why he needed prednisone. I didn't really get any information from him. He also told me to forget it he doesn't need anything.

## 2019-02-20 NOTE — Telephone Encounter (Signed)
He already was sent a refill on June 18 for 90 tabs so he should be good.

## 2019-02-22 ENCOUNTER — Telehealth: Payer: Self-pay | Admitting: *Deleted

## 2019-02-22 NOTE — Telephone Encounter (Signed)
Spoke w/Christine and gave VO for refill of Prednisone10 plus 1 refill.Elouise Munroe, CMA]

## 2019-03-18 DIAGNOSIS — R000 Tachycardia, unspecified: Secondary | ICD-10-CM | POA: Diagnosis not present

## 2019-03-18 DIAGNOSIS — R0689 Other abnormalities of breathing: Secondary | ICD-10-CM | POA: Diagnosis not present

## 2019-03-18 DIAGNOSIS — Z87891 Personal history of nicotine dependence: Secondary | ICD-10-CM | POA: Diagnosis not present

## 2019-03-18 DIAGNOSIS — E119 Type 2 diabetes mellitus without complications: Secondary | ICD-10-CM | POA: Diagnosis not present

## 2019-03-18 DIAGNOSIS — E785 Hyperlipidemia, unspecified: Secondary | ICD-10-CM | POA: Diagnosis not present

## 2019-03-18 DIAGNOSIS — J441 Chronic obstructive pulmonary disease with (acute) exacerbation: Secondary | ICD-10-CM | POA: Diagnosis not present

## 2019-03-18 DIAGNOSIS — Z79899 Other long term (current) drug therapy: Secondary | ICD-10-CM | POA: Diagnosis not present

## 2019-03-18 DIAGNOSIS — R52 Pain, unspecified: Secondary | ICD-10-CM | POA: Diagnosis not present

## 2019-03-18 DIAGNOSIS — R9431 Abnormal electrocardiogram [ECG] [EKG]: Secondary | ICD-10-CM | POA: Diagnosis not present

## 2019-03-18 DIAGNOSIS — R0602 Shortness of breath: Secondary | ICD-10-CM | POA: Diagnosis not present

## 2019-03-18 DIAGNOSIS — I10 Essential (primary) hypertension: Secondary | ICD-10-CM | POA: Diagnosis not present

## 2019-03-18 DIAGNOSIS — Z7984 Long term (current) use of oral hypoglycemic drugs: Secondary | ICD-10-CM | POA: Diagnosis not present

## 2019-03-18 DIAGNOSIS — R05 Cough: Secondary | ICD-10-CM | POA: Diagnosis not present

## 2019-03-18 DIAGNOSIS — Z209 Contact with and (suspected) exposure to unspecified communicable disease: Secondary | ICD-10-CM | POA: Diagnosis not present

## 2019-03-19 DIAGNOSIS — R9431 Abnormal electrocardiogram [ECG] [EKG]: Secondary | ICD-10-CM | POA: Diagnosis not present

## 2019-03-19 DIAGNOSIS — J438 Other emphysema: Secondary | ICD-10-CM | POA: Diagnosis not present

## 2019-03-19 DIAGNOSIS — I10 Essential (primary) hypertension: Secondary | ICD-10-CM | POA: Diagnosis not present

## 2019-03-19 DIAGNOSIS — J441 Chronic obstructive pulmonary disease with (acute) exacerbation: Secondary | ICD-10-CM | POA: Diagnosis not present

## 2019-03-19 DIAGNOSIS — E785 Hyperlipidemia, unspecified: Secondary | ICD-10-CM | POA: Diagnosis not present

## 2019-03-20 DIAGNOSIS — E785 Hyperlipidemia, unspecified: Secondary | ICD-10-CM | POA: Diagnosis not present

## 2019-03-20 DIAGNOSIS — E119 Type 2 diabetes mellitus without complications: Secondary | ICD-10-CM | POA: Diagnosis not present

## 2019-03-20 DIAGNOSIS — I10 Essential (primary) hypertension: Secondary | ICD-10-CM | POA: Diagnosis not present

## 2019-03-20 DIAGNOSIS — Z7401 Bed confinement status: Secondary | ICD-10-CM | POA: Diagnosis not present

## 2019-03-20 DIAGNOSIS — J439 Emphysema, unspecified: Secondary | ICD-10-CM | POA: Diagnosis not present

## 2019-03-20 DIAGNOSIS — M2550 Pain in unspecified joint: Secondary | ICD-10-CM | POA: Diagnosis not present

## 2019-04-02 ENCOUNTER — Other Ambulatory Visit: Payer: Self-pay

## 2019-04-03 ENCOUNTER — Ambulatory Visit: Payer: Medicare Other | Admitting: Family Medicine

## 2019-04-19 ENCOUNTER — Ambulatory Visit: Payer: Medicare Other | Admitting: Family Medicine

## 2019-04-20 ENCOUNTER — Other Ambulatory Visit: Payer: Self-pay | Admitting: Family Medicine

## 2019-04-29 ENCOUNTER — Other Ambulatory Visit: Payer: Self-pay | Admitting: Family Medicine

## 2019-04-29 DIAGNOSIS — E119 Type 2 diabetes mellitus without complications: Secondary | ICD-10-CM

## 2019-05-13 ENCOUNTER — Other Ambulatory Visit: Payer: Self-pay | Admitting: Family Medicine

## 2019-05-16 ENCOUNTER — Other Ambulatory Visit: Payer: Self-pay | Admitting: Family Medicine

## 2019-06-14 ENCOUNTER — Other Ambulatory Visit: Payer: Self-pay

## 2019-06-14 ENCOUNTER — Ambulatory Visit (INDEPENDENT_AMBULATORY_CARE_PROVIDER_SITE_OTHER): Payer: Medicare Other | Admitting: Family Medicine

## 2019-06-14 ENCOUNTER — Encounter: Payer: Self-pay | Admitting: Family Medicine

## 2019-06-14 VITALS — BP 101/70 | HR 68 | Ht 71.0 in | Wt 147.0 lb

## 2019-06-14 DIAGNOSIS — J441 Chronic obstructive pulmonary disease with (acute) exacerbation: Secondary | ICD-10-CM | POA: Diagnosis not present

## 2019-06-14 DIAGNOSIS — E119 Type 2 diabetes mellitus without complications: Secondary | ICD-10-CM | POA: Diagnosis not present

## 2019-06-14 DIAGNOSIS — I10 Essential (primary) hypertension: Secondary | ICD-10-CM

## 2019-06-14 DIAGNOSIS — E099 Drug or chemical induced diabetes mellitus without complications: Secondary | ICD-10-CM

## 2019-06-14 DIAGNOSIS — I509 Heart failure, unspecified: Secondary | ICD-10-CM | POA: Diagnosis not present

## 2019-06-14 DIAGNOSIS — J9611 Chronic respiratory failure with hypoxia: Secondary | ICD-10-CM | POA: Diagnosis not present

## 2019-06-14 LAB — POCT GLYCOSYLATED HEMOGLOBIN (HGB A1C): Hemoglobin A1C: 5.5 % (ref 4.0–5.6)

## 2019-06-14 MED ORDER — DOXYCYCLINE HYCLATE 100 MG PO TABS: 100.0000 mg | ORAL_TABLET | Freq: Two times a day (BID) | ORAL | 0 refills | Status: DC

## 2019-06-14 MED ORDER — ALBUTEROL SULFATE (2.5 MG/3ML) 0.083% IN NEBU: INHALATION_SOLUTION | RESPIRATORY_TRACT | 11 refills | Status: DC

## 2019-06-14 NOTE — Progress Notes (Signed)
Established Patient Office Visit  Subjective:  Patient ID: Jared Lee, male    DOB: 27-May-1941  Age: 78 y.o. MRN: [id number]  CC:  Chief Complaint  Patient presents with  . COPD    HPI Jared Lee presents for   F/U COPD -currently on 2 L of oxygen he has been staying in and using the air conditioning.  He did go to the ED in July for COPD but says he is actually been doing fairly well since then though more recently has been getting increased cough with sputum production.  He is also started taking his 10 mg of prednisone daily.  He uses it when he starts to feel like he is getting more short of breath.  20 mg causes him to get lower extremity edema but does really well and seems to respond well to 10 mg.  His son-in-law who is here with him says that he is a bit of a picky eater and sometimes just does not want to eat well.  He reports the food just does not taste good to him sometimes.  They are requesting a refill on the albuterol vials.  He has steroid-induced diabetes-due for hemoglobin A1c today.  Not currently checking his blood sugars but has been taking 2.5 mg of extended release glipizide.    Past Medical History:  Diagnosis Date  . COPD (chronic obstructive pulmonary disease) ([place])   . H/O colonoscopy 02/03/12   EGD as well. Mildy nodular gastritis.    . Hyperlipemia   . Hypertension   . Normal cardiac stress test 04/14/12   High POint REgional    Past Surgical History:  Procedure Laterality Date  . CATARACT EXTRACTION Right 03/15/2016  . CATARACT EXTRACTION W/ INTRAOCULAR LENS IMPLANT Left 04/05/2016  . IR GENERIC HISTORICAL  08/04/2016   IR RADIOLOGIST EVAL & MGMT 08/04/2016 [name], MD [facility] INTERV RAD    Family History  Problem Relation Age of Onset  . Heart disease Father   . Hypertension Father   . Emphysema Father   . Emphysema Paternal Grandfather     Social History   Socioeconomic History  . Marital status: Single    Spouse name: Not on  file  . Number of children: Not on file  . Years of education: Not on file  . Highest education level: Not on file  Occupational History  . Not on file  Social Needs  . Financial resource strain: Not on file  . Food insecurity    Worry: Not on file    Inability: Not on file  . Transportation needs    Medical: Not on file    Non-medical: Not on file  Tobacco Use  . Smoking status: Former Smoker    Packs/day: 1.00    Years: 66.00    Pack years: 66.00    Types: Cigarettes    Quit date: 08/31/2011    Years since quitting: 7.7  . Smokeless tobacco: Never Used  Substance and Sexual Activity  . Alcohol use: No  . Drug use: No  . Sexual activity: Not on file  Lifestyle  . Physical activity    Days per week: Not on file    Minutes per session: Not on file  . Stress: Not on file  Relationships  . Social Herbalist on phone: Not on file    Gets together: Not on file    Attends religious service: Not on file    Active member of  club or organization: Not on file    Attends meetings of clubs or organizations: Not on file    Relationship status: Not on file  . Intimate partner violence    Fear of current or ex partner: Not on file    Emotionally abused: Not on file    Physically abused: Not on file    Forced sexual activity: Not on file  Other Topics Concern  . Not on file  Social History Narrative  . Not on file    Outpatient Medications Prior to Visit  Medication Sig Dispense Refill  . AMBULATORY NON FORMULARY MEDICATION Medication Name: Ambulatory oxygen cocentrator.  Pt is mobile within th home.  Pt requests portable concentrator. Using [employer] for oxygen needs. 1 Units 0  . AMBULATORY NON FORMULARY MEDICATION Medication Name: lightweight wheelchair with gel seat cushion, anti-tippers, brake lock extensions and elevated leg rests. DX: J44.1, R29.898 1 each 0  . aspirin 81 MG tablet Take 81 mg by mouth daily.    . Blood Glucose Monitoring Suppl (BLOOD GLUCOSE  MONITOR SYSTEM) W/DEVICE KIT Check twice a day. 1 each 0  . budesonide-formoterol (SYMBICORT) 160-4.5 MCG/ACT inhaler Inhale 2 puffs into the lungs 2 (two) times daily. 3 Inhaler 1  . Calcium Carb-Cholecalciferol (CALCIUM-VITAMIN D) 500-200 MG-UNIT tablet Take 1 tablet by mouth daily.    . Ferrous Sulfate (IRON) 325 (65 Fe) MG TABS Take 1 tablet by mouth once daily with breakfast 90 tablet 0  . ipratropium (ATROVENT) 0.02 % nebulizer solution Take 2.5 mLs (0.5 mg total) by nebulization every 6 (six) hours as needed for wheezing or shortness of breath. Dx COPD. ICD-9 Code 496. 75 mL 12  . lisinopril (PRINIVIL,ZESTRIL) 5 MG tablet Take 1 tablet (5 mg total) by mouth daily. 90 tablet 1  . metoprolol tartrate (LOPRESSOR) 100 MG tablet Take 1 tablet by mouth twice daily 180 tablet 0  . NEXIUM 20 MG capsule TAKE ONE CAPSULE BY MOUTH IN THE MORNING BEFORE BREAKFAST 90 capsule 3  . pravastatin (PRAVACHOL) 20 MG tablet Take 1 tablet (20 mg total) by mouth daily. 90 tablet 3  . tiotropium (SPIRIVA HANDIHALER) 18 MCG inhalation capsule Place 1 capsule (18 mcg total) into inhaler and inhale daily. 90 capsule 1  . VENTOLIN HFA 108 (90 Base) MCG/ACT inhaler INHALE 2 PUFFS BY MOUTH EVERY 6 HOURS AS NEEDED FOR WHEEZING OR  SHORTNESS  OF  BREATH 54 each 3  . albuterol (PROVENTIL) (2.5 MG/3ML) 0.083% nebulizer solution USE 1 VIAL IN NEBULIZER EVERY 4 HOURS AS NEEDED FOR WHEEZING AND FOR SHORTNESS OF BREATH 1575 vial 5  . AMBULATORY NON FORMULARY MEDICATION Medication Name: One Touch Ultra Test Strips for BID Testing Daily. Dx Code: 250.00  Dx: Type II or unspecified type diabetes mellitus without mention of complication, not stated as uncontrolled (250.00) 100 each 2  . glipiZIDE (GLUCOTROL XL) 2.5 MG 24 hr tablet TAKE 1 TABLET BY MOUTH IN THE MORNING WITH BREAKFAST 90 tablet 0  . colestipol (COLESTID) 1 g tablet TAKE 1 TABLET BY MOUTH TWICE DAILY AS NEEDED FOR DIARRHEA 180 tablet 0  . predniSONE (DELTASONE) 10 MG  tablet Take 1 tablet (10 mg total) by mouth daily. TAKE 4 TABLETS ONCE DAILY FOR 2 DAYS, 3 DAILY FOR 2 DAYS, 2 DAILY FOR 2 DAYS, 1 DAILY FOR 2 DAYS, UNTIL COMPLETE 90 tablet 1   No facility-administered medications prior to visit.     Allergies  Allergen Reactions  . Chlorpromazine Swelling  . Fluticasone-Salmeterol Swelling  .  Augmentin [Amoxicillin-Pot Clavulanate] Nausea And Vomiting    Stomach pain as well.   . Azithromycin Diarrhea    severe  . Daliresp [Roflumilast] Other (See Comments)    HA, diarrhea   . Levaquin [Levofloxacin]   . Prednisone Other (See Comments)    Swelling    ROS Review of Systems    Objective:    Physical Exam  Constitutional: He is oriented to person, place, and time. He appears well-developed and well-nourished.  HENT:  Head: Normocephalic and atraumatic.  Eyes: Conjunctivae are normal.  Cardiovascular: Normal rate, regular rhythm and normal heart sounds.  Radial pulse 2+ bilaterally.  Pulmonary/Chest: Effort normal and breath sounds normal.  Musculoskeletal:        General: No edema.  Neurological: He is alert and oriented to person, place, and time.  Skin: Skin is warm and dry.  Psychiatric: He has a normal mood and affect. His behavior is normal.    BP 101/70   Pulse 68   Ht '5\' 11"'$  (1.803 m)   Wt 147 lb (66.7 kg)   SpO2 95%   BMI 20.50 kg/m  Wt Readings from Last 3 Encounters:  06/14/19 147 lb (66.7 kg)  11/16/18 143 lb (64.9 kg)  04/22/17 133 lb (60.3 kg)     There are no preventive care reminders to display for this patient.  There are no preventive care reminders to display for this patient.  Lab Results  Component Value Date   TSH 1.436 12/31/2009   Lab Results  Component Value Date   WBC 10.8 11/16/2018   HGB 12.2 (L) 11/16/2018   HCT 35.8 (L) 11/16/2018   MCV 85.4 11/16/2018   PLT 188 11/16/2018   Lab Results  Component Value Date   NA 139 11/16/2018   K 5.6 (H) 11/16/2018   CO2 30 11/16/2018   GLUCOSE  152 (H) 11/16/2018   BUN 25 11/16/2018   CREATININE 1.13 11/16/2018   BILITOT 0.3 11/16/2018   ALKPHOS 58 01/22/2016   AST 23 11/16/2018   ALT 13 11/16/2018   PROT 5.6 (L) 11/16/2018   ALBUMIN 3.8 01/22/2016   CALCIUM 9.0 11/16/2018   Lab Results  Component Value Date   CHOL 215 (H) 01/22/2016   Lab Results  Component Value Date   HDL 94 01/22/2016   Lab Results  Component Value Date   LDLCALC 94 01/22/2016   Lab Results  Component Value Date   TRIG 137 01/22/2016   Lab Results  Component Value Date   CHOLHDL 2.3 01/22/2016   Lab Results  Component Value Date   HGBA1C 5.5 06/14/2019      Assessment & Plan:   Problem List Items Addressed This Visit      Cardiovascular and Mediastinum   HYPERTENSION, BENIGN    Pressure is just a little bit low today.  He is currently on metoprolol 100 mg twice a day.  Consider cutting tab in half and taking half a tab twice a day.      CHF (NYHA class II, ACC/AHA stage C) (H[facility])    No sign of volume overload today.        Respiratory   COPD exacerbation ([place])    Note below.      Relevant Medications   albuterol (PROVENTIL) (2.5 MG/3ML) 0.083% nebulizer solution   Chronic respiratory failure with hypoxia ([place])    Overall he is actually doing really well considering he has not been to the hospital since July.  I did go ahead  and give him a round of doxycycline today for some increased cough and sputum production and is currently taking 10 mg of prednisone.  He gets worse encouraged to son-in-law to take him to the emergency department.  The Spiriva and Symbicort.        Endocrine   Diabetes mellitus ([place]) - Primary   Relevant Orders   POCT glycosylated hemoglobin (Hb A1C) (Completed)   Chemical induced diabetes mellitus ([facility])    Discontinue glipizide since his A1c is 5.5 today.  Previously repetitive courses of prednisone have really increased his blood sugars but I am concerned that glipizide will actually cause him to  become hypoglycemic especially with an A1c of 5.5 so we will just hold it for now we can always restart it later.  Plan to follow-up in 3 to 4 months and we can recheck it at that time.         Meds ordered this encounter  Medications  . albuterol (PROVENTIL) (2.5 MG/3ML) 0.083% nebulizer solution    Sig: USE 1 VIAL IN NEBULIZER EVERY 4 HOURS AS NEEDED FOR WHEEZING AND FOR SHORTNESS OF BREATH    Dispense:  1575 mL    Refill:  11  . doxycycline (VIBRA-TABS) 100 MG tablet    Sig: Take 1 tablet (100 mg total) by mouth 2 (two) times daily.    Dispense:  20 tablet    Refill:  0    Follow-up: Return in about 4 months (around 10/15/2019) for COPD.    Beatrice Lecher, MD

## 2019-06-14 NOTE — Assessment & Plan Note (Addendum)
Pressure is just a little bit low today.  He is currently on metoprolol 100 mg twice a day.  Consider cutting tab in half and taking half a tab twice a day.

## 2019-06-14 NOTE — Patient Instructions (Addendum)
Ok to stop the glipizide. You can start the antibiotic this evening.  Since your blood pressure is low today I want you to cut your metoprolol in half.  So take half a tab in the morning and a half a tab in the evening instead of a whole tab.

## 2019-06-14 NOTE — Assessment & Plan Note (Signed)
Discontinue glipizide since his A1c is 5.5 today.  Previously repetitive courses of prednisone have really increased his blood sugars but I am concerned that glipizide will actually cause him to become hypoglycemic especially with an A1c of 5.5 so we will just hold it for now we can always restart it later.  Plan to follow-up in 3 to 4 months and we can recheck it at that time.

## 2019-06-14 NOTE — Assessment & Plan Note (Signed)
Note below

## 2019-06-14 NOTE — Assessment & Plan Note (Signed)
No sign of volume overload today.  

## 2019-06-14 NOTE — Assessment & Plan Note (Addendum)
Overall he is actually doing really well considering he has not been to the hospital since July.  I did go ahead and give him a round of doxycycline today for some increased cough and sputum production and is currently taking 10 mg of prednisone.  He gets worse encouraged to son-in-law to take him to the emergency department.  The Spiriva and Symbicort.

## 2019-07-17 ENCOUNTER — Other Ambulatory Visit: Payer: Self-pay | Admitting: Family Medicine

## 2019-07-17 NOTE — Telephone Encounter (Signed)
Not on current medication list.   

## 2019-08-16 ENCOUNTER — Other Ambulatory Visit: Payer: Self-pay | Admitting: Family Medicine

## 2019-08-20 ENCOUNTER — Other Ambulatory Visit: Payer: Self-pay | Admitting: Family Medicine

## 2019-08-20 ENCOUNTER — Other Ambulatory Visit: Payer: Self-pay | Admitting: *Deleted

## 2019-08-20 MED ORDER — PREDNISONE 10 MG PO TABS: 10.0000 mg | ORAL_TABLET | Freq: Every day | ORAL | 0 refills | Status: DC

## 2019-10-16 ENCOUNTER — Ambulatory Visit: Payer: Medicare Other | Admitting: Family Medicine

## 2019-10-16 ENCOUNTER — Other Ambulatory Visit: Payer: Self-pay | Admitting: Family Medicine

## 2019-11-21 ENCOUNTER — Telehealth: Payer: Self-pay | Admitting: Family Medicine

## 2019-11-21 ENCOUNTER — Other Ambulatory Visit: Payer: Self-pay | Admitting: Family Medicine

## 2019-11-21 NOTE — Telephone Encounter (Signed)
Received fax for PA on Nexium sent through cover my meds waiting on determination. - CF

## 2019-11-21 NOTE — Telephone Encounter (Signed)
Has appointment tomorrow. Forwarded Prednisone refill to you for authorization or decline. KG LPN

## 2019-11-22 ENCOUNTER — Ambulatory Visit (INDEPENDENT_AMBULATORY_CARE_PROVIDER_SITE_OTHER): Payer: Medicare Other | Admitting: Family Medicine

## 2019-11-22 ENCOUNTER — Encounter: Payer: Self-pay | Admitting: Family Medicine

## 2019-11-22 ENCOUNTER — Other Ambulatory Visit: Payer: Self-pay

## 2019-11-22 VITALS — BP 138/75 | HR 89

## 2019-11-22 DIAGNOSIS — E119 Type 2 diabetes mellitus without complications: Secondary | ICD-10-CM

## 2019-11-22 DIAGNOSIS — J441 Chronic obstructive pulmonary disease with (acute) exacerbation: Secondary | ICD-10-CM

## 2019-11-22 DIAGNOSIS — J9611 Chronic respiratory failure with hypoxia: Secondary | ICD-10-CM | POA: Diagnosis not present

## 2019-11-22 DIAGNOSIS — I509 Heart failure, unspecified: Secondary | ICD-10-CM

## 2019-11-22 DIAGNOSIS — E875 Hyperkalemia: Secondary | ICD-10-CM | POA: Diagnosis not present

## 2019-11-22 LAB — POCT GLYCOSYLATED HEMOGLOBIN (HGB A1C): Hemoglobin A1C: 5.7 % — AB (ref 4.0–5.6)

## 2019-11-22 MED ORDER — PREDNISONE 10 MG PO TABS: ORAL_TABLET | ORAL | 0 refills | Status: DC

## 2019-11-22 MED ORDER — DOXYCYCLINE HYCLATE 100 MG PO TABS: 100.0000 mg | ORAL_TABLET | Freq: Two times a day (BID) | ORAL | 0 refills | Status: DC

## 2019-11-22 NOTE — Assessment & Plan Note (Signed)
On chronic oxygen therapy.  Continue current inhaler regimen.  We refilled his prednisone yesterday.  We will treat acute exacerbation with doxycycline.

## 2019-11-22 NOTE — Assessment & Plan Note (Signed)
No sign of volume overload on exam today.  Shortness of breath is related to his COPD

## 2019-11-22 NOTE — Progress Notes (Signed)
Established Patient Office Visit  Subjective:  Patient ID: Jared Lee, male    DOB: [birth year]  Age: 79 y.o. MRN: [id number]  CC:  Chief Complaint  Patient presents with  . COPD  . Diabetes    HPI URIAH PHILIPSON presents for 1-monthfollow-up- son in law is with him today. They use a cab for trasportion.    Diabetes - no hypoglycemic events. No wounds or sores that are not healing well. No increased thirst or urination. Checking glucose at home. Taking medications as prescribed without any side effects.    F/U COPD -last time he was actually seen in the emergency department was July 19.  This is actually probably the long as he is gone without going to the emergency room.  He has been using his prednisone pretty regularly and in fact had actually called for refill yesterday.  He reports that he has had a "cold" over the last 7 to 8 days with increased sinus pressure and cough and chest congestion he is says that he has noticed some green-colored sputum.  No fevers or chills that he is aware of.  Denies any loose stools or diarrhea.  He has been using his nebulizer treatments.  Follow-up congestive heart failure.  No recent swelling but he has been more short of breath with his breathing and increased sputum production.  Past Medical History:  Diagnosis Date  . COPD (chronic obstructive pulmonary disease) ([place]   . H/O colonoscopy 02/03/12   EGD as well. Mildy nodular gastritis.    . Hyperlipemia   . Hypertension   . Normal cardiac stress test 04/14/12   High POint REgional    Past Surgical History:  Procedure Laterality Date  . CATARACT EXTRACTION Right 03/15/2016  . CATARACT EXTRACTION W/ INTRAOCULAR LENS IMPLANT Left 04/05/2016  . IR GENERIC HISTORICAL  08/04/2016   IR RADIOLOGIST EVAL & MGMT 08/04/2016 DArne Cleveland MD [facility] INTERV RAD    Family History  Problem Relation Age of Onset  . Heart disease Father   . Hypertension Father   . Emphysema Father   . Emphysema  Paternal Grandfather     Social History   Socioeconomic History  . Marital status: Single    Spouse name: Not on file  . Number of children: Not on file  . Years of education: Not on file  . Highest education level: Not on file  Occupational History  . Not on file  Tobacco Use  . Smoking status: Former Smoker    Packs/day: 1.00    Years: 66.00    Pack years: 66.00    Types: Cigarettes    Quit date: 08/31/2011    Years since quitting: 8.2  . Smokeless tobacco: Never Used  Substance and Sexual Activity  . Alcohol use: No  . Drug use: No  . Sexual activity: Not on file  Other Topics Concern  . Not on file  Social History Narrative  . Not on file   Social Determinants of Health   Financial Resource Strain:   . Difficulty of Paying Living Expenses:   Food Insecurity:   . Worried About RCharity fundraiserin the Last Year:   . RArboriculturistin the Last Year:   Transportation Needs:   . LFilm/video editor(Medical):   .Marland KitchenLack of Transportation (Non-Medical):   Physical Activity:   . Days of Exercise per Week:   . Minutes of Exercise per Session:   Stress:   .  Feeling of Stress :   Social Connections:   . Frequency of Communication with Friends and Family:   . Frequency of Social Gatherings with Friends and Family:   . Attends Religious Services:   . Active Member of Clubs or Organizations:   . Attends Archivist Meetings:   Marland Kitchen Marital Status:   Intimate Partner Violence:   . Fear of Current or Ex-Partner:   . Emotionally Abused:   Marland Kitchen Physically Abused:   . Sexually Abused:     Outpatient Medications Prior to Visit  Medication Sig Dispense Refill  . albuterol (PROVENTIL) (2.5 MG/3ML) 0.083% nebulizer solution USE 1 VIAL IN NEBULIZER EVERY 4 HOURS AS NEEDED FOR WHEEZING AND FOR SHORTNESS OF BREATH 1575 mL 11  . AMBULATORY NON FORMULARY MEDICATION Medication Name: Ambulatory oxygen cocentrator.  Pt is mobile within th home.  Pt requests portable  concentrator. Using [employer] for oxygen needs. 1 Units 0  . AMBULATORY NON FORMULARY MEDICATION Medication Name: lightweight wheelchair with gel seat cushion, anti-tippers, brake lock extensions and elevated leg rests. DX: J44.1, R29.898 1 each 0  . aspirin 81 MG tablet Take 81 mg by mouth daily.    . Blood Glucose Monitoring Suppl (BLOOD GLUCOSE MONITOR SYSTEM) W/DEVICE KIT Check twice a day. 1 each 0  . budesonide-formoterol (SYMBICORT) 160-4.5 MCG/ACT inhaler Inhale 2 puffs into the lungs 2 (two) times daily. 3 Inhaler 1  . Calcium Carb-Cholecalciferol (CALCIUM-VITAMIN D) 500-200 MG-UNIT tablet Take 1 tablet by mouth daily.    . colestipol (COLESTID) 1 g tablet TAKE 1 TABLET BY MOUTH TWICE DAILY AS NEEDED FOR DIARRHEA 180 tablet 0  . Ferrous Sulfate (IRON) 325 (65 Fe) MG TABS Take 1 tablet by mouth once daily with breakfast 90 tablet 1  . ipratropium (ATROVENT) 0.02 % nebulizer solution Take 2.5 mLs (0.5 mg total) by nebulization every 6 (six) hours as needed for wheezing or shortness of breath. Dx COPD. ICD-9 Code 496. 75 mL 12  . lisinopril (ZESTRIL) 5 MG tablet Take 1 tablet by mouth once daily 90 tablet 0  . metoprolol tartrate (LOPRESSOR) 100 MG tablet Take 1 tablet by mouth twice daily 180 tablet 0  . pravastatin (PRAVACHOL) 20 MG tablet Take 1 tablet (20 mg total) by mouth daily. 90 tablet 3  . SPIRIVA HANDIHALER 18 MCG inhalation capsule INHALE CONTENTS OF ONE CAPSULE VIA HANDIHALER ONCE DAILY 90 capsule 1  . VENTOLIN HFA 108 (90 Base) MCG/ACT inhaler INHALE 2 PUFFS BY MOUTH EVERY 6 HOURS AS NEEDED FOR WHEEZING OR  SHORTNESS  OF  BREATH 54 each 3  . NEXIUM 20 MG capsule TAKE ONE CAPSULE BY MOUTH IN THE MORNING BEFORE BREAKFAST 90 capsule 3  . predniSONE (DELTASONE) 10 MG tablet TAKE 4 TABLETS DAILY FOR 2 DAYS, 3 DAILY FOR 2 DAYS, 2 DAILY FOR 2 DAYS, 1 DAILY FOR 2 DAYS 36 tablet 0   No facility-administered medications prior to visit.    Allergies  Allergen Reactions  . Chlorpromazine  Swelling  . Fluticasone-Salmeterol Swelling  . Augmentin [Amoxicillin-Pot Clavulanate] Nausea And Vomiting    Stomach pain as well.   . Azithromycin Diarrhea    severe  . Daliresp [Roflumilast] Other (See Comments)    HA, diarrhea   . Levaquin [Levofloxacin]   . Prednisone Other (See Comments)    Swelling    ROS Review of Systems    Objective:    Physical Exam  Constitutional: He is oriented to person, place, and time. He appears well-developed and well-nourished.  HENT:  Head: Normocephalic and atraumatic.  Cardiovascular: Normal rate, regular rhythm and normal heart sounds.  Pulmonary/Chest: Effort normal. He has wheezes.  Diffuse mild wheezing.   Neurological: He is alert and oriented to person, place, and time.  Skin: Skin is warm and dry.  Psychiatric: He has a normal mood and affect. His behavior is normal.    BP 138/75   Pulse 89   SpO2 95%  Wt Readings from Last 3 Encounters:  06/14/19 147 lb (66.7 kg)  11/16/18 143 lb (64.9 kg)  04/22/17 133 lb (60.3 kg)     There are no preventive care reminders to display for this patient.  There are no preventive care reminders to display for this patient.  Lab Results  Component Value Date   TSH 1.436 12/31/2009   Lab Results  Component Value Date   WBC 10.8 11/16/2018   HGB 12.2 (L) 11/16/2018   HCT 35.8 (L) 11/16/2018   MCV 85.4 11/16/2018   PLT 188 11/16/2018   Lab Results  Component Value Date   NA 139 11/16/2018   K 5.6 (H) 11/16/2018   CO2 30 11/16/2018   GLUCOSE 152 (H) 11/16/2018   BUN 25 11/16/2018   CREATININE 1.13 11/16/2018   BILITOT 0.3 11/16/2018   ALKPHOS 58 01/22/2016   AST 23 11/16/2018   ALT 13 11/16/2018   PROT 5.6 (L) 11/16/2018   ALBUMIN 3.8 01/22/2016   CALCIUM 9.0 11/16/2018   Lab Results  Component Value Date   CHOL 215 (H) 01/22/2016   Lab Results  Component Value Date   HDL 94 01/22/2016   Lab Results  Component Value Date   LDLCALC 94 01/22/2016   Lab Results   Component Value Date   TRIG 137 01/22/2016   Lab Results  Component Value Date   CHOLHDL 2.3 01/22/2016   Lab Results  Component Value Date   HGBA1C 5.7 (A) 11/22/2019      Assessment & Plan:   Problem List Items Addressed This Visit      Cardiovascular and Mediastinum   CHF (NYHA class II, ACC/AHA stage C) (HCC)    No sign of volume overload on exam today.  Shortness of breath is related to his COPD        Respiratory   COPD exacerbation ([facility])    End-stage COPD.  He has been in exacerbation for about the last 8 days.  He does have some prednisone at home but did go ahead and make sure that he had a refill we had actually sent it over yesterday but it kicked back and said that it did not go through so I resent it today.  To follow the taper instructions in addition did send over prescription for doxycycline he does have a penicillin sulfa allergy.  If not better or getting worse and encouraged that he give [place] a call back or go to the emergency department if it is over the weekend.  But he is actually done really well and has not been in the emergency room for a little over 7 months which is fantastic.  And then having access to the prednisone to use as needed has really made a big difference and thus far his A1c has been well controlled.      Relevant Medications   doxycycline (VIBRA-TABS) 100 MG tablet   predniSONE (DELTASONE) 10 MG tablet   Other Relevant Orders   COMPLETE METABOLIC PANEL WITH GFR   CBC   Chronic respiratory failure with  hypoxia ([place])    On chronic oxygen therapy.  Continue current inhaler regimen.  We refilled his prednisone yesterday.  We will treat acute exacerbation with doxycycline.      Relevant Orders   COMPLETE METABOLIC PANEL WITH GFR   CBC     Endocrine   Diabetes mellitus ([place]) - Primary    Once he looks great today at 5.7.  He reports that he does check his blood sugars occasionally.  He is not currently on medication his diabetes was  actually induced by chronic prednisone use.      Relevant Orders   POCT glycosylated hemoglobin (Hb A1C) (Completed)   COMPLETE METABOLIC PANEL WITH GFR   CBC     Routine labs checked today.  Will call with results once available.   Meds ordered this encounter  Medications  . doxycycline (VIBRA-TABS) 100 MG tablet    Sig: Take 1 tablet (100 mg total) by mouth 2 (two) times daily.    Dispense:  20 tablet    Refill:  0  . predniSONE (DELTASONE) 10 MG tablet    Sig: TAKE 4 TABLETS DAILY FOR 2 DAYS, 3 DAILY FOR 2 DAYS, 2 DAILY FOR 2 DAYS, 1 DAILY FOR 2 DAYS    Dispense:  90 tablet    Refill:  0    Follow-up: Return in about 6 months (around 05/24/2020) for Diabetes follow-up and COPD.    Beatrice Lecher, MD

## 2019-11-22 NOTE — Assessment & Plan Note (Signed)
End-stage COPD.  He has been in exacerbation for about the last 8 days.  He does have some prednisone at home but did go ahead and make sure that he had a refill we had actually sent it over yesterday but it kicked back and said that it did not go through so I resent it today.  To follow the taper instructions in addition did send over prescription for doxycycline he does have a penicillin sulfa allergy.  If not better or getting worse and encouraged that he give [place] a call back or go to the emergency department if it is over the weekend.  But he is actually done really well and has not been in the emergency room for a little over 7 months which is fantastic.  And then having access to the prednisone to use as needed has really made a big difference and thus far his A1c has been well controlled.

## 2019-11-22 NOTE — Addendum Note (Signed)
Addended by: Narda Rutherford on: 11/22/2019 07:05 AM   Modules accepted: Orders

## 2019-11-22 NOTE — Assessment & Plan Note (Signed)
Once he looks great today at 5.7.  He reports that he does check his blood sugars occasionally.  He is not currently on medication his diabetes was actually induced by chronic prednisone use.

## 2019-11-22 NOTE — Telephone Encounter (Signed)
Received fax from [place] and they denied coverage on Nexium due to it is not on patient's plan formulary. Placing in providers box for review. - CF

## 2019-11-23 ENCOUNTER — Telehealth: Payer: Self-pay | Admitting: Family Medicine

## 2019-11-23 LAB — COMPLETE METABOLIC PANEL WITH GFR
AG Ratio: 2.1 (calc) (ref 1.0–2.5)
ALT: 14 U/L (ref 9–46)
AST: 23 U/L (ref 10–35)
Albumin: 3.9 g/dL (ref 3.6–5.1)
Alkaline phosphatase (APISO): 55 U/L (ref 35–144)
BUN/Creatinine Ratio: 28 (calc) — ABNORMAL HIGH (ref 6–22)
BUN: 35 mg/dL — ABNORMAL HIGH (ref 7–25)
CO2: 30 mmol/L (ref 20–32)
Calcium: 9.4 mg/dL (ref 8.6–10.3)
Chloride: 106 mmol/L (ref 98–110)
Creat: 1.24 mg/dL — ABNORMAL HIGH (ref 0.70–1.11)
GFR, Est African American: 63 mL/min/{1.73_m2} (ref >=60)
GFR, Est Non African American: 55 mL/min/{1.73_m2} — ABNORMAL LOW (ref >=60)
Globulin: 1.9 g/dL (calc) (ref 1.9–3.7)
Glucose, Bld: 193 mg/dL — ABNORMAL HIGH (ref 65–99)
Potassium: 5.4 mmol/L — ABNORMAL HIGH (ref 3.5–5.3)
Sodium: 142 mmol/L (ref 135–146)
Total Bilirubin: 0.3 mg/dL (ref 0.2–1.2)
Total Protein: 5.8 g/dL — ABNORMAL LOW (ref 6.1–8.1)

## 2019-11-23 LAB — CBC
HCT: 35.4 % — ABNORMAL LOW (ref 38.5–50.0)
Hemoglobin: 11.3 g/dL — ABNORMAL LOW (ref 13.2–17.1)
MCH: 28.6 pg (ref 27.0–33.0)
MCHC: 31.9 g/dL — ABNORMAL LOW (ref 32.0–36.0)
MCV: 89.6 fL (ref 80.0–100.0)
MPV: 12.4 fL (ref 7.5–12.5)
Platelets: 174 10*3/uL (ref 140–400)
RBC: 3.95 10*6/uL — ABNORMAL LOW (ref 4.20–5.80)
RDW: 13.4 % (ref 11.0–15.0)
WBC: 9.6 10*3/uL (ref 3.8–10.8)

## 2019-11-23 MED ORDER — LANSOPRAZOLE 30 MG PO CPDR: 30.0000 mg | DELAYED_RELEASE_CAPSULE | Freq: Every morning | ORAL | 1 refills | Status: DC

## 2019-11-23 MED ORDER — SODIUM POLYSTYRENE SULFONATE 15 GM/60ML PO SUSP: 15.0000 g | ORAL | 3 refills | Status: DC

## 2019-11-23 NOTE — Addendum Note (Signed)
Addended by: Beatrice Lecher D on: 11/23/2019 08:57 AM   Modules accepted: Orders

## 2019-11-23 NOTE — Telephone Encounter (Signed)
Call pt: Nexium no longer covered. They will pay for lansoprazole and dexilant. I will send over a script for lansoprazole for reflux/heartburn

## 2019-11-23 NOTE — Telephone Encounter (Signed)
lvm w/recommendations. Advised to rtn call if any questions.Elouise Munroe, [place]

## 2019-12-06 ENCOUNTER — Other Ambulatory Visit: Payer: Self-pay | Admitting: Family Medicine

## 2020-01-11 DIAGNOSIS — J9621 Acute and chronic respiratory failure with hypoxia: Secondary | ICD-10-CM | POA: Diagnosis not present

## 2020-01-11 DIAGNOSIS — R1314 Dysphagia, pharyngoesophageal phase: Secondary | ICD-10-CM | POA: Diagnosis not present

## 2020-01-11 DIAGNOSIS — J439 Emphysema, unspecified: Secondary | ICD-10-CM | POA: Diagnosis not present

## 2020-01-11 DIAGNOSIS — R0902 Hypoxemia: Secondary | ICD-10-CM | POA: Diagnosis not present

## 2020-01-11 DIAGNOSIS — J441 Chronic obstructive pulmonary disease with (acute) exacerbation: Secondary | ICD-10-CM | POA: Diagnosis not present

## 2020-01-11 DIAGNOSIS — R112 Nausea with vomiting, unspecified: Secondary | ICD-10-CM | POA: Diagnosis not present

## 2020-01-11 DIAGNOSIS — R000 Tachycardia, unspecified: Secondary | ICD-10-CM | POA: Diagnosis not present

## 2020-01-11 DIAGNOSIS — R062 Wheezing: Secondary | ICD-10-CM | POA: Diagnosis not present

## 2020-01-11 DIAGNOSIS — R0603 Acute respiratory distress: Secondary | ICD-10-CM | POA: Diagnosis not present

## 2020-01-11 DIAGNOSIS — R197 Diarrhea, unspecified: Secondary | ICD-10-CM | POA: Diagnosis not present

## 2020-01-11 DIAGNOSIS — E860 Dehydration: Secondary | ICD-10-CM | POA: Diagnosis not present

## 2020-01-11 DIAGNOSIS — K5730 Diverticulosis of large intestine without perforation or abscess without bleeding: Secondary | ICD-10-CM | POA: Diagnosis not present

## 2020-01-11 DIAGNOSIS — R0602 Shortness of breath: Secondary | ICD-10-CM | POA: Diagnosis not present

## 2020-01-11 DIAGNOSIS — J9622 Acute and chronic respiratory failure with hypercapnia: Secondary | ICD-10-CM | POA: Diagnosis not present

## 2020-01-11 DIAGNOSIS — J80 Acute respiratory distress syndrome: Secondary | ICD-10-CM | POA: Diagnosis not present

## 2020-01-11 DIAGNOSIS — J189 Pneumonia, unspecified organism: Secondary | ICD-10-CM | POA: Diagnosis not present

## 2020-01-12 DIAGNOSIS — R195 Other fecal abnormalities: Secondary | ICD-10-CM | POA: Diagnosis not present

## 2020-01-12 DIAGNOSIS — R0602 Shortness of breath: Secondary | ICD-10-CM | POA: Diagnosis not present

## 2020-01-12 DIAGNOSIS — Z7401 Bed confinement status: Secondary | ICD-10-CM | POA: Diagnosis not present

## 2020-01-12 DIAGNOSIS — R197 Diarrhea, unspecified: Secondary | ICD-10-CM | POA: Diagnosis present

## 2020-01-12 DIAGNOSIS — Z9981 Dependence on supplemental oxygen: Secondary | ICD-10-CM | POA: Diagnosis not present

## 2020-01-12 DIAGNOSIS — R2231 Localized swelling, mass and lump, right upper limb: Secondary | ICD-10-CM | POA: Diagnosis not present

## 2020-01-12 DIAGNOSIS — K219 Gastro-esophageal reflux disease without esophagitis: Secondary | ICD-10-CM | POA: Diagnosis present

## 2020-01-12 DIAGNOSIS — R1314 Dysphagia, pharyngoesophageal phase: Secondary | ICD-10-CM | POA: Diagnosis present

## 2020-01-12 DIAGNOSIS — M79601 Pain in right arm: Secondary | ICD-10-CM | POA: Diagnosis not present

## 2020-01-12 DIAGNOSIS — R269 Unspecified abnormalities of gait and mobility: Secondary | ICD-10-CM | POA: Diagnosis present

## 2020-01-12 DIAGNOSIS — R5381 Other malaise: Secondary | ICD-10-CM | POA: Diagnosis not present

## 2020-01-12 DIAGNOSIS — J441 Chronic obstructive pulmonary disease with (acute) exacerbation: Secondary | ICD-10-CM | POA: Diagnosis not present

## 2020-01-12 DIAGNOSIS — E785 Hyperlipidemia, unspecified: Secondary | ICD-10-CM | POA: Diagnosis present

## 2020-01-12 DIAGNOSIS — R9431 Abnormal electrocardiogram [ECG] [EKG]: Secondary | ICD-10-CM | POA: Diagnosis not present

## 2020-01-12 DIAGNOSIS — I480 Paroxysmal atrial fibrillation: Secondary | ICD-10-CM | POA: Diagnosis present

## 2020-01-12 DIAGNOSIS — R1310 Dysphagia, unspecified: Secondary | ICD-10-CM | POA: Diagnosis not present

## 2020-01-12 DIAGNOSIS — J9600 Acute respiratory failure, unspecified whether with hypoxia or hypercapnia: Secondary | ICD-10-CM | POA: Diagnosis not present

## 2020-01-12 DIAGNOSIS — M2550 Pain in unspecified joint: Secondary | ICD-10-CM | POA: Diagnosis not present

## 2020-01-12 DIAGNOSIS — E119 Type 2 diabetes mellitus without complications: Secondary | ICD-10-CM | POA: Diagnosis present

## 2020-01-12 DIAGNOSIS — I10 Essential (primary) hypertension: Secondary | ICD-10-CM | POA: Diagnosis present

## 2020-01-12 DIAGNOSIS — J9622 Acute and chronic respiratory failure with hypercapnia: Secondary | ICD-10-CM | POA: Insufficient documentation

## 2020-01-12 DIAGNOSIS — E860 Dehydration: Secondary | ICD-10-CM | POA: Diagnosis present

## 2020-01-12 DIAGNOSIS — J449 Chronic obstructive pulmonary disease, unspecified: Secondary | ICD-10-CM | POA: Diagnosis not present

## 2020-01-12 DIAGNOSIS — J439 Emphysema, unspecified: Secondary | ICD-10-CM | POA: Diagnosis present

## 2020-01-12 DIAGNOSIS — L539 Erythematous condition, unspecified: Secondary | ICD-10-CM | POA: Diagnosis not present

## 2020-01-12 DIAGNOSIS — R112 Nausea with vomiting, unspecified: Secondary | ICD-10-CM | POA: Diagnosis present

## 2020-01-12 DIAGNOSIS — E875 Hyperkalemia: Secondary | ICD-10-CM | POA: Diagnosis present

## 2020-01-12 DIAGNOSIS — J9621 Acute and chronic respiratory failure with hypoxia: Secondary | ICD-10-CM | POA: Diagnosis present

## 2020-01-12 DIAGNOSIS — J189 Pneumonia, unspecified organism: Secondary | ICD-10-CM | POA: Diagnosis present

## 2020-01-13 ENCOUNTER — Other Ambulatory Visit: Payer: Self-pay | Admitting: Family Medicine

## 2020-01-15 ENCOUNTER — Telehealth: Payer: Self-pay | Admitting: Family Medicine

## 2020-01-15 MED ORDER — INSULIN LISPRO 100 UNIT/ML ~~LOC~~ SOLN
1.00 | SUBCUTANEOUS | Status: DC
Start: 2020-01-24 — End: 2020-01-15

## 2020-01-15 MED ORDER — FERROUS SULFATE 325 (65 FE) MG PO TABS
325.00 | ORAL_TABLET | ORAL | Status: DC
Start: 2020-01-25 — End: 2020-01-15

## 2020-01-15 MED ORDER — ONDANSETRON HCL 4 MG/2ML IJ SOLN
4.00 | INTRAMUSCULAR | Status: DC
Start: ? — End: 2020-01-15

## 2020-01-15 MED ORDER — GLUCAGON (RDNA) 1 MG IJ KIT
1.00 | PACK | INTRAMUSCULAR | Status: DC
Start: ? — End: 2020-01-15

## 2020-01-15 MED ORDER — DEXTROSE 10 % IV SOLN
125.00 | INTRAVENOUS | Status: DC
Start: ? — End: 2020-01-15

## 2020-01-15 MED ORDER — DOXYCYCLINE HYCLATE 100 MG PO TABS
100.00 | ORAL_TABLET | ORAL | Status: DC
Start: 2020-01-24 — End: 2020-01-15

## 2020-01-15 MED ORDER — RA PROBIOTIC DIGESTIVE CARE PO CAPS
1.00 | ORAL_CAPSULE | ORAL | Status: DC
Start: 2020-01-24 — End: 2020-01-15

## 2020-01-15 MED ORDER — DEXTROMETHORPHAN-GUAIFENESIN 10-100 MG/5ML PO LIQD
5.00 | ORAL | Status: DC
Start: ? — End: 2020-01-15

## 2020-01-15 MED ORDER — GENERIC EXTERNAL MEDICATION
Status: DC
Start: 2020-01-15 — End: 2020-01-15

## 2020-01-15 MED ORDER — LISINOPRIL 5 MG PO TABS
5.00 | ORAL_TABLET | ORAL | Status: DC
Start: 2020-01-25 — End: 2020-01-15

## 2020-01-15 MED ORDER — ASPIRIN 81 MG PO TBEC
81.00 | DELAYED_RELEASE_TABLET | ORAL | Status: DC
Start: 2020-01-25 — End: 2020-01-15

## 2020-01-15 MED ORDER — METOPROLOL TARTRATE 50 MG PO TABS
100.00 | ORAL_TABLET | ORAL | Status: DC
Start: 2020-01-24 — End: 2020-01-15

## 2020-01-15 MED ORDER — GUAIFENESIN ER 600 MG PO TB12
600.00 | ORAL_TABLET | ORAL | Status: DC
Start: 2020-01-15 — End: 2020-01-15

## 2020-01-15 MED ORDER — HEPARIN SODIUM (PORCINE) 5000 UNIT/ML IJ SOLN
5000.00 | INTRAMUSCULAR | Status: DC
Start: 2020-01-24 — End: 2020-01-15

## 2020-01-15 MED ORDER — ACETAMINOPHEN 325 MG PO TABS
650.00 | ORAL_TABLET | ORAL | Status: DC
Start: ? — End: 2020-01-15

## 2020-01-15 MED ORDER — ALBUTEROL SULFATE HFA 108 (90 BASE) MCG/ACT IN AERS
2.00 | INHALATION_SPRAY | RESPIRATORY_TRACT | Status: DC
Start: ? — End: 2020-01-15

## 2020-01-15 MED ORDER — METHYLPREDNISOLONE SODIUM SUCC 125 MG IJ SOLR
60.00 | INTRAMUSCULAR | Status: DC
Start: 2020-01-15 — End: 2020-01-15

## 2020-01-15 MED ORDER — PANTOPRAZOLE SODIUM 40 MG PO TBEC
40.00 | DELAYED_RELEASE_TABLET | ORAL | Status: DC
Start: 2020-01-25 — End: 2020-01-15

## 2020-01-15 MED ORDER — GLUCOSE 40 % PO GEL
15.00 | ORAL | Status: DC
Start: ? — End: 2020-01-15

## 2020-01-15 MED ORDER — PRAVASTATIN SODIUM 10 MG PO TABS
20.00 | ORAL_TABLET | ORAL | Status: DC
Start: 2020-01-24 — End: 2020-01-15

## 2020-01-15 MED ORDER — FLUTICASONE FUROATE-VILANTEROL 100-25 MCG/INH IN AEPB
1.00 | INHALATION_SPRAY | RESPIRATORY_TRACT | Status: DC
Start: 2020-01-25 — End: 2020-01-15

## 2020-01-15 MED ORDER — UMECLIDINIUM BROMIDE 62.5 MCG/INH IN AEPB
1.00 | INHALATION_SPRAY | RESPIRATORY_TRACT | Status: DC
Start: 2020-01-25 — End: 2020-01-15

## 2020-01-15 NOTE — Telephone Encounter (Signed)
Received fax for prior authorization on Spiriva sent through cover my meds waiting on determination. - CF

## 2020-01-15 NOTE — Telephone Encounter (Signed)
Received fax from [place] and they approved spiriva from 10/17/19 - 01/14/21. - CF

## 2020-01-24 MED ORDER — GUAIFENESIN ER 600 MG PO TB12
1200.00 | ORAL_TABLET | ORAL | Status: DC
Start: 2020-01-24 — End: 2020-01-24

## 2020-01-24 MED ORDER — PREDNISONE 20 MG PO TABS
40.00 | ORAL_TABLET | ORAL | Status: DC
Start: 2020-01-25 — End: 2020-01-24

## 2020-01-24 MED ORDER — GENERIC EXTERNAL MEDICATION
Status: DC
Start: ? — End: 2020-01-24

## 2020-01-24 MED ORDER — GENERIC EXTERNAL MEDICATION
Status: DC
Start: 2020-01-24 — End: 2020-01-24

## 2020-01-24 MED ORDER — LOPERAMIDE HCL 2 MG PO CAPS
2.00 | ORAL_CAPSULE | ORAL | Status: DC
Start: ? — End: 2020-01-24

## 2020-02-19 ENCOUNTER — Other Ambulatory Visit: Payer: Self-pay | Admitting: Family Medicine

## 2020-03-09 ENCOUNTER — Other Ambulatory Visit: Payer: Self-pay | Admitting: Family Medicine

## 2020-04-15 ENCOUNTER — Other Ambulatory Visit: Payer: Self-pay | Admitting: Family Medicine

## 2020-05-15 ENCOUNTER — Telehealth: Payer: Self-pay

## 2020-05-15 ENCOUNTER — Other Ambulatory Visit: Payer: Self-pay | Admitting: *Deleted

## 2020-05-15 ENCOUNTER — Other Ambulatory Visit: Payer: Self-pay

## 2020-05-15 ENCOUNTER — Other Ambulatory Visit: Payer: Self-pay | Admitting: Family Medicine

## 2020-05-15 MED ORDER — PREDNISONE 10 MG PO TABS: ORAL_TABLET | ORAL | 0 refills | Status: DC

## 2020-05-15 MED ORDER — LISINOPRIL 5 MG PO TABS: 5.0000 mg | ORAL_TABLET | Freq: Every day | ORAL | 0 refills | Status: DC

## 2020-05-15 NOTE — Telephone Encounter (Signed)
Attempted to contact pt/stepson multiple times. Call gets rejected. Unable to update patient regarding med refill for prednisone.

## 2020-05-15 NOTE — Telephone Encounter (Signed)
Refill sent.

## 2020-05-15 NOTE — Telephone Encounter (Signed)
Pt's stepson called requesting med refill for prednisone. Pls advise if appropriate. Thanks.

## 2020-05-19 ENCOUNTER — Other Ambulatory Visit: Payer: Self-pay | Admitting: Family Medicine

## 2020-05-19 ENCOUNTER — Other Ambulatory Visit: Payer: Self-pay | Admitting: *Deleted

## 2020-05-26 ENCOUNTER — Other Ambulatory Visit: Payer: Self-pay

## 2020-05-26 ENCOUNTER — Encounter: Payer: Self-pay | Admitting: Family Medicine

## 2020-05-26 ENCOUNTER — Other Ambulatory Visit: Payer: Self-pay | Admitting: Family Medicine

## 2020-05-26 ENCOUNTER — Ambulatory Visit (INDEPENDENT_AMBULATORY_CARE_PROVIDER_SITE_OTHER): Payer: Medicare Other | Admitting: Family Medicine

## 2020-05-26 VITALS — BP 136/68 | HR 78

## 2020-05-26 DIAGNOSIS — E611 Iron deficiency: Secondary | ICD-10-CM

## 2020-05-26 DIAGNOSIS — J9611 Chronic respiratory failure with hypoxia: Secondary | ICD-10-CM | POA: Diagnosis not present

## 2020-05-26 DIAGNOSIS — I10 Essential (primary) hypertension: Secondary | ICD-10-CM

## 2020-05-26 DIAGNOSIS — D696 Thrombocytopenia, unspecified: Secondary | ICD-10-CM

## 2020-05-26 DIAGNOSIS — E119 Type 2 diabetes mellitus without complications: Secondary | ICD-10-CM | POA: Diagnosis not present

## 2020-05-26 DIAGNOSIS — E099 Drug or chemical induced diabetes mellitus without complications: Secondary | ICD-10-CM | POA: Diagnosis not present

## 2020-05-26 LAB — POCT GLYCOSYLATED HEMOGLOBIN (HGB A1C): Hemoglobin A1C: 5.5 % (ref 4.0–5.6)

## 2020-05-26 MED ORDER — AMBULATORY NON FORMULARY MEDICATION: 99 refills | Status: AC

## 2020-05-26 NOTE — Assessment & Plan Note (Signed)
Is actually doing really well in fact his A1c is back down into the normal range this was primarily steroid induced which she does still use occasionally so still when I continue to keep an eye on his A1c we will continue to check every 6 months.

## 2020-05-26 NOTE — Progress Notes (Signed)
Established Patient Office Visit  Subjective:  Patient ID: Jared Lee, male    DOB: December 10, 1940  Age: 79 y.o. MRN: [id number]  CC:  Chief Complaint  Patient presents with  . Diabetes  . COPD    HPI Jared Lee presents for 6 mo f/u for   Diabetes - no hypoglycemic events. No wounds or sores that are not healing well. No increased thirst or urination. Checking glucose at home. Taking medications as prescribed without any side effects.  F/U COPD - last admission was in May of this year.  Son-in-law who is with him today says he is actually been doing okay for a while he was in and out of the hospital every other month and now he has been able to go months in between.  He uses as needed steroids and that has actually seem to make a big difference.  He has not gotten any new tubing [facility] for his oxygen supplies in quite some time.  Past Medical History:  Diagnosis Date  . COPD (chronic obstructive pulmonary disease) ([place])   . H/O colonoscopy 02/03/12   EGD as well. Mildy nodular gastritis.    . Hyperlipemia   . Hypertension   . Normal cardiac stress test 04/14/12   High POint [facility]    Past Surgical History:  Procedure Laterality Date  . CATARACT EXTRACTION Right 03/15/2016  . CATARACT EXTRACTION W/ INTRAOCULAR LENS IMPLANT Left 04/05/2016  . IR GENERIC HISTORICAL  08/04/2016   IR RADIOLOGIST EVAL & MGMT 08/04/2016 [name], MD [facility] INTERV RAD    Family History  Problem Relation Age of Onset  . Heart disease Father   . Hypertension Father   . Emphysema Father   . Emphysema Paternal Grandfather     Social History   Socioeconomic History  . Marital status: Single    Spouse name: Not on file  . Number of children: Not on file  . Years of education: Not on file  . Highest education level: Not on file  Occupational History  . Not on file  Tobacco Use  . Smoking status: Former Smoker    Packs/day: 1.00    Years: 66.00    Pack years: 66.00     Types: Cigarettes    Quit date: 08/31/2011    Years since quitting: 8.7  . Smokeless tobacco: Never Used  Substance and Sexual Activity  . Alcohol use: No  . Drug use: No  . Sexual activity: Not on file  Other Topics Concern  . Not on file  Social History Narrative  . Not on file   Social Determinants of Health   Financial Resource Strain:   . Difficulty of Paying Living Expenses: Not on file  Food Insecurity:   . Worried About Charity fundraiser in the Last Year: Not on file  . Ran Out of Food in the Last Year: Not on file  Transportation Needs:   . Lack of Transportation (Medical): Not on file  . Lack of Transportation (Non-Medical): Not on file  Physical Activity:   . Days of Exercise per Week: Not on file  . Minutes of Exercise per Session: Not on file  Stress:   . Feeling of Stress : Not on file  Social Connections:   . Frequency of Communication with Friends and Family: Not on file  . Frequency of Social Gatherings with Friends and Family: Not on file  . Attends Religious Services: Not on file  . Active  Member of Clubs or Organizations: Not on file  . Attends Archivist Meetings: Not on file  . Marital Status: Not on file  Intimate Partner Violence:   . Fear of Current or Ex-Partner: Not on file  . Emotionally Abused: Not on file  . Physically Abused: Not on file  . Sexually Abused: Not on file    Outpatient Medications Prior to Visit  Medication Sig Dispense Refill  . albuterol (PROVENTIL) (2.5 MG/3ML) 0.083% nebulizer solution USE 1 VIAL IN NEBULIZER EVERY 4 HOURS AS NEEDED FOR WHEEZING AND FOR SHORTNESS OF BREATH 1575 mL 11  . AMBULATORY NON FORMULARY MEDICATION Medication Name: Ambulatory oxygen cocentrator.  Pt is mobile within th home.  Pt requests portable concentrator. Using [facility] for [facility] needs. 1 Units 0  . AMBULATORY NON FORMULARY MEDICATION Medication Name: lightweight wheelchair with gel seat cushion, anti-tippers, brake lock extensions  and elevated leg rests. DX: J44.1, R29.898 1 each 0  . aspirin 81 MG tablet Take 81 mg by mouth daily.    . Blood Glucose Monitoring Suppl (BLOOD GLUCOSE MONITOR SYSTEM) W/DEVICE KIT Check twice a day. 1 each 0  . budesonide-formoterol (SYMBICORT) 160-4.5 MCG/ACT inhaler Inhale 2 puffs into the lungs 2 (two) times daily. 3 Inhaler 1  . Calcium Carb-Cholecalciferol (CALCIUM-VITAMIN D) 500-200 MG-UNIT tablet Take 1 tablet by mouth daily.    . colestipol (COLESTID) 1 g tablet TAKE 1 TABLET BY MOUTH TWICE DAILY AS NEEDED FOR DIARRHEA 180 tablet 0  . Ferrous Sulfate (IRON) 325 (65 Fe) MG TABS ONE TABLET BY MOUTH ONCE DAILY WITH BREAKFAST 90 tablet 0  . ipratropium (ATROVENT) 0.02 % nebulizer solution Take 2.5 mLs (0.5 mg total) by nebulization every 6 (six) hours as needed for wheezing or shortness of breath. Dx COPD. ICD-9 Code 496. 75 mL 12  . lansoprazole (PREVACID) 30 MG capsule Take 1 capsule (30 mg total) by mouth in the morning. 90 capsule 1  . lisinopril (ZESTRIL) 5 MG tablet Take 1 tablet (5 mg total) by mouth daily. 90 tablet 0  . metoprolol tartrate (LOPRESSOR) 100 MG tablet Take 1 tablet by mouth twice daily 180 tablet 0  . pravastatin (PRAVACHOL) 20 MG tablet Take 1 tablet (20 mg total) by mouth daily. 90 tablet 0  . predniSONE (DELTASONE) 10 MG tablet TAKE 4 TABLETS DAILY FOR 2 DAYS, 3 DAILY FOR 2 DAYS, 2 DAILY FOR 2 DAYS, 1 DAILY FOR 2 DAYS 90 tablet 0  . sodium polystyrene (KAYEXALATE) 15 GM/60ML suspension Take 60 mLs (15 g total) by mouth 2 (two) times a week. 240 mL 3  . SPIRIVA HANDIHALER 18 MCG inhalation capsule INHALE CONTENTS OF ONE CAPSULE VIA HANDIHALER ONCE DAILY 90 capsule 1  . VENTOLIN HFA 108 (90 Base) MCG/ACT inhaler INHALE 2 PUFFS BY MOUTH EVERY 6 HOURS AS NEEDED FOR WHEEZING OR  SHORTNESS  OF  BREATH 54 each 3   No facility-administered medications prior to visit.    Allergies  Allergen Reactions  . Chlorpromazine Swelling  . Fluticasone-Salmeterol Swelling  .  Augmentin [Amoxicillin-Pot Clavulanate] Nausea And Vomiting    Stomach pain as well.   . Azithromycin Diarrhea    severe  . Daliresp [Roflumilast] Other (See Comments)    HA, diarrhea   . Levaquin [Levofloxacin]   . Prednisone Other (See Comments)    Swelling    ROS Review of Systems    Objective:    Physical Exam Constitutional:      Appearance: He is well-developed.  HENT:  Head: Normocephalic and atraumatic.  Cardiovascular:     Rate and Rhythm: Normal rate and regular rhythm.     Heart sounds: Normal heart sounds.  Pulmonary:     Comments: Poor air movement.  Patient is posturing in chair.  He does have his oxygen on.  Some scattered crackles throughout the lungs.  No wheezing. Skin:    General: Skin is warm and dry.  Neurological:     Mental Status: He is alert and oriented to person, place, and time.  Psychiatric:        Behavior: Behavior normal.     BP 136/68   Pulse 78   SpO2 98% Comment: 2L Wt Readings from Last 3 Encounters:  06/14/19 147 lb (66.7 kg)  11/16/18 143 lb (64.9 kg)  04/22/17 133 lb (60.3 kg)     There are no preventive care reminders to display for this patient.  There are no preventive care reminders to display for this patient.  Lab Results  Component Value Date   TSH 1.436 12/31/2009   Lab Results  Component Value Date   WBC 9.6 11/22/2019   HGB 11.3 (L) 11/22/2019   HCT 35.4 (L) 11/22/2019   MCV 89.6 11/22/2019   PLT 174 11/22/2019   Lab Results  Component Value Date   NA 142 11/22/2019   K 5.4 (H) 11/22/2019   CO2 30 11/22/2019   GLUCOSE 193 (H) 11/22/2019   BUN 35 (H) 11/22/2019   CREATININE 1.24 (H) 11/22/2019   BILITOT 0.3 11/22/2019   ALKPHOS 58 01/22/2016   AST 23 11/22/2019   ALT 14 11/22/2019   PROT 5.8 (L) 11/22/2019   ALBUMIN 3.8 01/22/2016   CALCIUM 9.4 11/22/2019   Lab Results  Component Value Date   CHOL 215 (H) 01/22/2016   Lab Results  Component Value Date   HDL 94 01/22/2016   Lab  Results  Component Value Date   LDLCALC 94 01/22/2016   Lab Results  Component Value Date   TRIG 137 01/22/2016   Lab Results  Component Value Date   CHOLHDL 2.3 01/22/2016   Lab Results  Component Value Date   HGBA1C 5.5 05/26/2020      Assessment & Plan:   Problem List Items Addressed This Visit      Cardiovascular and Mediastinum   HYPERTENSION, BENIGN    Well controlled. Continue current regimen. Follow up in  6 mo        Respiratory   Chronic respiratory failure with hypoxia ([place])    Did give him nebulizer tubing here in the office today as he only has 1 at home currently.  Will [facility] to see if we can get him new supplies am not sure why they are not coming out to deliver the supplies if they are delivering his oxygen.      Relevant Medications   AMBULATORY NON FORMULARY MEDICATION   Other Relevant Orders   COMPLETE METABOLIC PANEL WITH GFR   Lipid panel   CBC   Fe+TIBC+Fer     Endocrine   RESOLVED: Diabetes mellitus ([place]) - Primary   Relevant Orders   POCT glycosylated hemoglobin (Hb A1C) (Completed)   COMPLETE METABOLIC PANEL WITH GFR   Lipid panel   CBC   Fe+TIBC+Fer   Chemical induced diabetes mellitus ([place])    Is actually doing really well in fact his A1c is back down into the normal range this was primarily steroid induced which she does still use  occasionally so still when I continue to keep an eye on his A1c we will continue to check every 6 months.        Other   Thrombocytopenia ([place])    Due to recheck platelet count.      Iron deficiency    Ports that he still taking his iron supplement.  Tolerating it well with any GI side effects.  Due to recheck iron levels.      Relevant Orders   Fe+TIBC+Fer      Meds ordered this encounter  Medications  . AMBULATORY NON FORMULARY MEDICATION    Sig: Medication Name: Needs oxygen tubing for his nebulizer as well as his chronic oxygen therapy.  [facility] to  [facility]. Dx COPD    Dispense:  10 Units    Refill:  PRN    Follow-up: Return in about 6 months (around 11/23/2020) for A1C and COPD.    Beatrice Lecher, MD

## 2020-05-26 NOTE — Assessment & Plan Note (Signed)
Due to recheck platelet count.

## 2020-05-26 NOTE — Assessment & Plan Note (Signed)
Ports that he still taking his iron supplement.  Tolerating it well with any GI side effects.  Due to recheck iron levels.

## 2020-05-26 NOTE — Assessment & Plan Note (Signed)
Well controlled. Continue current regimen. Follow up in  6 mo  

## 2020-05-26 NOTE — Assessment & Plan Note (Signed)
Did give him nebulizer tubing here in the office today as he only has 1 at home currently.  Will reach out to [facility] to see if we can get him new supplies am not sure why they are not coming out to deliver the supplies if they are delivering his oxygen.

## 2020-05-27 LAB — LIPID PANEL
Cholesterol: 224 mg/dL — ABNORMAL HIGH (ref <200)
HDL: 98 mg/dL (ref >=40)
LDL Cholesterol (Calc): 109 mg/dL (calc) — ABNORMAL HIGH
Non-HDL Cholesterol (Calc): 126 mg/dL (calc) (ref <130)
Total CHOL/HDL Ratio: 2.3 (calc) (ref <5.0)
Triglycerides: 77 mg/dL (ref <150)

## 2020-05-27 LAB — CBC
HCT: 32.5 % — ABNORMAL LOW (ref 38.5–50.0)
Hemoglobin: 10.5 g/dL — ABNORMAL LOW (ref 13.2–17.1)
MCH: 26.9 pg — ABNORMAL LOW (ref 27.0–33.0)
MCHC: 32.3 g/dL (ref 32.0–36.0)
MCV: 83.1 fL (ref 80.0–100.0)
MPV: 12.2 fL (ref 7.5–12.5)
Platelets: 194 10*3/uL (ref 140–400)
RBC: 3.91 10*6/uL — ABNORMAL LOW (ref 4.20–5.80)
RDW: 15.6 % — ABNORMAL HIGH (ref 11.0–15.0)
WBC: 7.7 10*3/uL (ref 3.8–10.8)

## 2020-05-27 LAB — IRON,TIBC AND FERRITIN PANEL
%SAT: 48 % (calc) (ref 20–48)
Ferritin: 18 ng/mL — ABNORMAL LOW (ref 24–380)
Iron: 187 ug/dL — ABNORMAL HIGH (ref 50–180)
TIBC: 391 mcg/dL (calc) (ref 250–425)

## 2020-05-27 LAB — COMPLETE METABOLIC PANEL WITH GFR
AG Ratio: 2 (calc) (ref 1.0–2.5)
ALT: 14 U/L (ref 9–46)
AST: 19 U/L (ref 10–35)
Albumin: 4 g/dL (ref 3.6–5.1)
Alkaline phosphatase (APISO): 61 U/L (ref 35–144)
BUN/Creatinine Ratio: 33 (calc) — ABNORMAL HIGH (ref 6–22)
BUN: 34 mg/dL — ABNORMAL HIGH (ref 7–25)
CO2: 29 mmol/L (ref 20–32)
Calcium: 9.1 mg/dL (ref 8.6–10.3)
Chloride: 105 mmol/L (ref 98–110)
Creat: 1.03 mg/dL (ref 0.70–1.11)
GFR, Est African American: 79 mL/min/{1.73_m2} (ref >=60)
GFR, Est Non African American: 68 mL/min/{1.73_m2} (ref >=60)
Globulin: 2 g/dL (calc) (ref 1.9–3.7)
Glucose, Bld: 190 mg/dL — ABNORMAL HIGH (ref 65–99)
Potassium: 5.4 mmol/L — ABNORMAL HIGH (ref 3.5–5.3)
Sodium: 142 mmol/L (ref 135–146)
Total Bilirubin: 0.3 mg/dL (ref 0.2–1.2)
Total Protein: 6 g/dL — ABNORMAL LOW (ref 6.1–8.1)

## 2020-05-30 ENCOUNTER — Other Ambulatory Visit: Payer: Self-pay | Admitting: Family Medicine

## 2020-06-03 DIAGNOSIS — R9431 Abnormal electrocardiogram [ECG] [EKG]: Secondary | ICD-10-CM | POA: Diagnosis not present

## 2020-06-03 DIAGNOSIS — E785 Hyperlipidemia, unspecified: Secondary | ICD-10-CM | POA: Diagnosis present

## 2020-06-03 DIAGNOSIS — E119 Type 2 diabetes mellitus without complications: Secondary | ICD-10-CM | POA: Diagnosis present

## 2020-06-03 DIAGNOSIS — I10 Essential (primary) hypertension: Secondary | ICD-10-CM | POA: Diagnosis present

## 2020-06-03 DIAGNOSIS — R509 Fever, unspecified: Secondary | ICD-10-CM | POA: Diagnosis not present

## 2020-06-03 DIAGNOSIS — R5381 Other malaise: Secondary | ICD-10-CM | POA: Diagnosis not present

## 2020-06-03 DIAGNOSIS — Z7982 Long term (current) use of aspirin: Secondary | ICD-10-CM | POA: Diagnosis not present

## 2020-06-03 DIAGNOSIS — Z87891 Personal history of nicotine dependence: Secondary | ICD-10-CM | POA: Diagnosis not present

## 2020-06-03 DIAGNOSIS — I480 Paroxysmal atrial fibrillation: Secondary | ICD-10-CM | POA: Diagnosis not present

## 2020-06-03 DIAGNOSIS — Z9981 Dependence on supplemental oxygen: Secondary | ICD-10-CM | POA: Diagnosis not present

## 2020-06-03 DIAGNOSIS — J441 Chronic obstructive pulmonary disease with (acute) exacerbation: Secondary | ICD-10-CM | POA: Diagnosis present

## 2020-06-03 DIAGNOSIS — J439 Emphysema, unspecified: Secondary | ICD-10-CM | POA: Diagnosis not present

## 2020-06-03 DIAGNOSIS — J9620 Acute and chronic respiratory failure, unspecified whether with hypoxia or hypercapnia: Secondary | ICD-10-CM | POA: Diagnosis not present

## 2020-06-03 DIAGNOSIS — H9190 Unspecified hearing loss, unspecified ear: Secondary | ICD-10-CM | POA: Diagnosis present

## 2020-06-03 DIAGNOSIS — Z20822 Contact with and (suspected) exposure to covid-19: Secondary | ICD-10-CM | POA: Diagnosis present

## 2020-06-03 DIAGNOSIS — Z7952 Long term (current) use of systemic steroids: Secondary | ICD-10-CM | POA: Diagnosis not present

## 2020-06-03 DIAGNOSIS — R0602 Shortness of breath: Secondary | ICD-10-CM | POA: Diagnosis not present

## 2020-06-03 DIAGNOSIS — M2550 Pain in unspecified joint: Secondary | ICD-10-CM | POA: Diagnosis not present

## 2020-06-03 DIAGNOSIS — Z7951 Long term (current) use of inhaled steroids: Secondary | ICD-10-CM | POA: Diagnosis not present

## 2020-06-03 DIAGNOSIS — R0789 Other chest pain: Secondary | ICD-10-CM | POA: Diagnosis not present

## 2020-06-03 DIAGNOSIS — J9621 Acute and chronic respiratory failure with hypoxia: Secondary | ICD-10-CM | POA: Diagnosis not present

## 2020-06-03 DIAGNOSIS — J9611 Chronic respiratory failure with hypoxia: Secondary | ICD-10-CM | POA: Diagnosis not present

## 2020-06-03 DIAGNOSIS — R0600 Dyspnea, unspecified: Secondary | ICD-10-CM | POA: Diagnosis not present

## 2020-06-03 DIAGNOSIS — Z7401 Bed confinement status: Secondary | ICD-10-CM | POA: Diagnosis not present

## 2020-06-09 ENCOUNTER — Other Ambulatory Visit: Payer: Self-pay | Admitting: Family Medicine

## 2020-06-11 ENCOUNTER — Encounter: Payer: Self-pay | Admitting: Neurology

## 2020-06-17 ENCOUNTER — Other Ambulatory Visit: Payer: Self-pay | Admitting: Family Medicine

## 2020-06-17 DIAGNOSIS — J441 Chronic obstructive pulmonary disease with (acute) exacerbation: Secondary | ICD-10-CM

## 2020-06-20 MED ORDER — ALBUTEROL SULFATE (2.5 MG/3ML) 0.083% IN NEBU: INHALATION_SOLUTION | RESPIRATORY_TRACT | 0 refills | Status: DC

## 2020-06-20 NOTE — Addendum Note (Signed)
Addended by: Towana Badger on: 06/20/2020 02:53 PM   Modules accepted: Orders

## 2020-07-05 IMAGING — DX CHEST - 2 VIEW
2 series · 2 of 2 positions shown · non-contrast
Comparison: Prior chest x-ray and chest CT 09/04/2018

CLINICAL DATA: 77-year-old male with productive cough for the past
3-4 days. No fever.

EXAM:
CHEST - 2 VIEW

[chest lat]
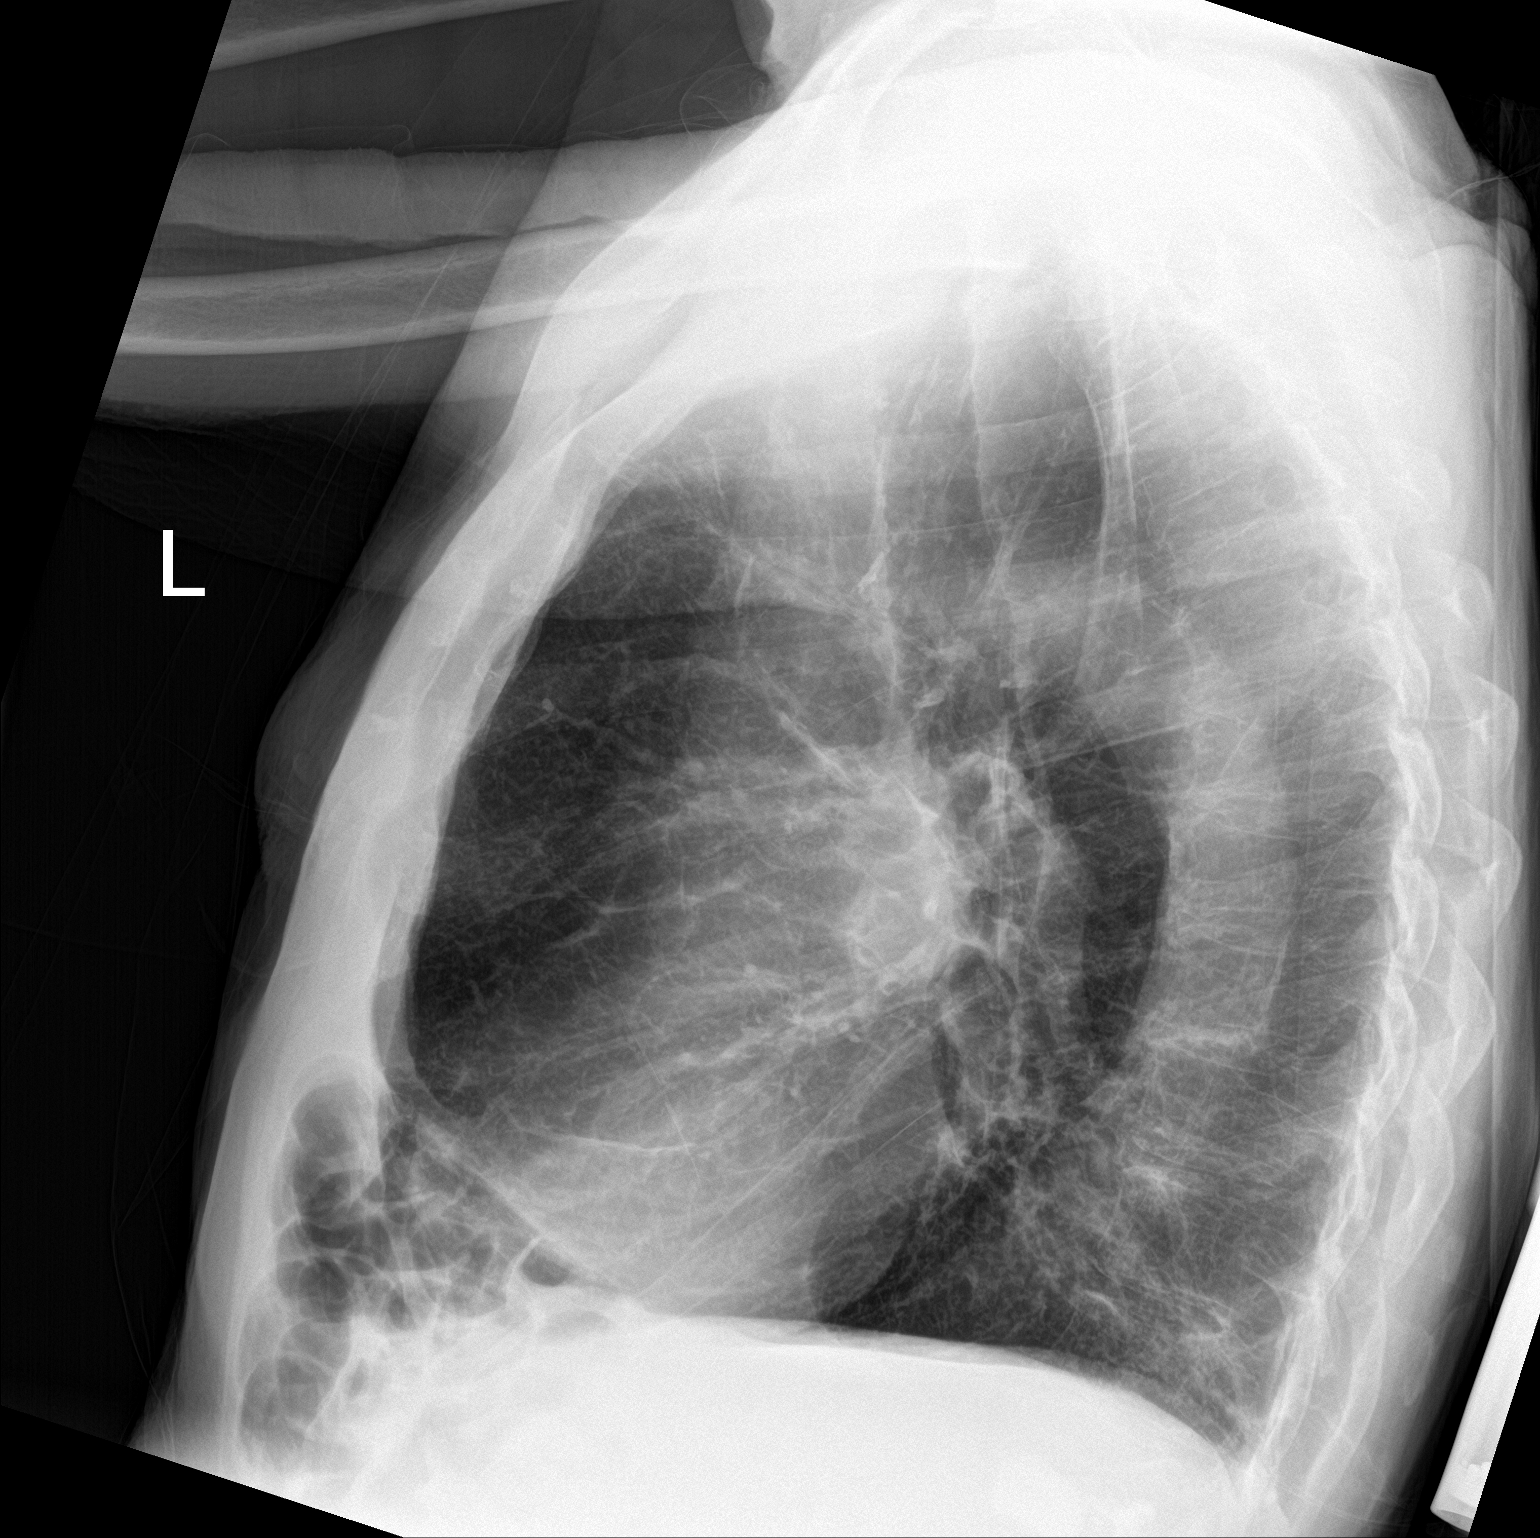

[chest ap]
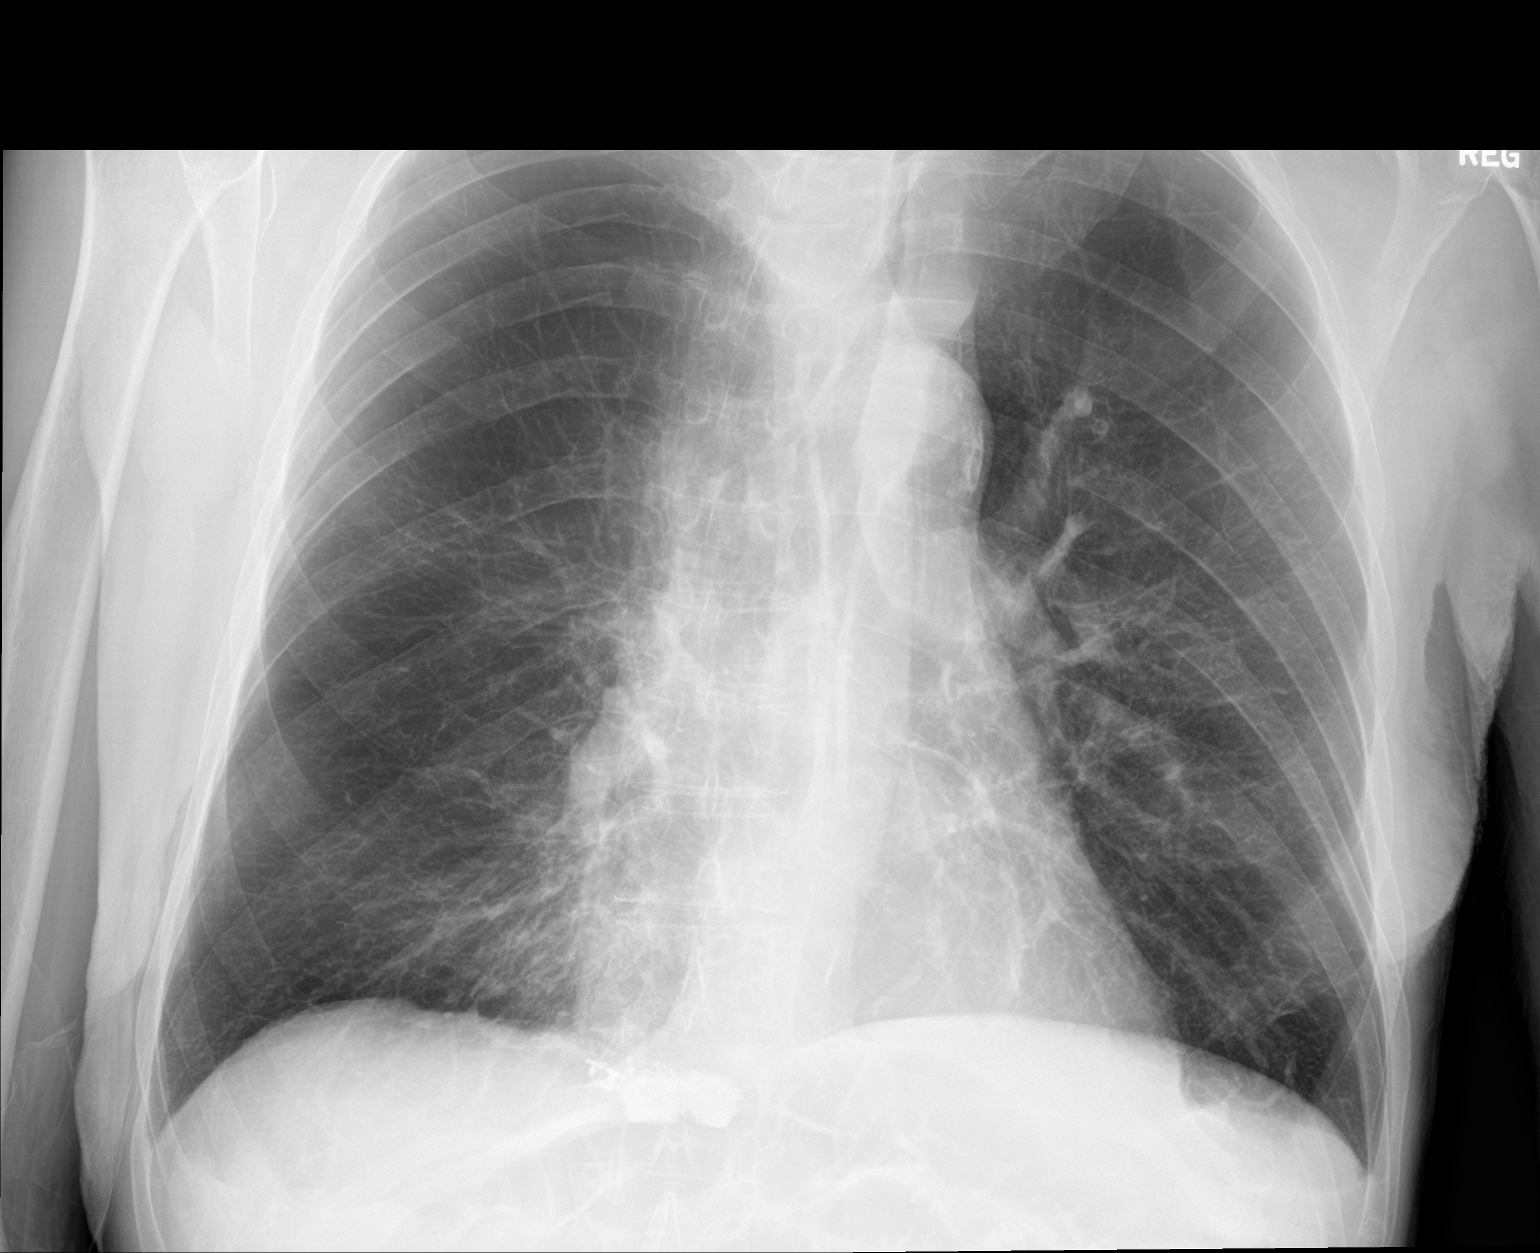

[2 of 2 positions shown; findings below may reference images not displayed]

FINDINGS: Cardiac and mediastinal contours are unchanged and remain within
normal limits. Atherosclerotic calcifications are again noted in the
transverse aorta. The lungs remain hyperinflated with chronic
bronchitic changes and mild interstitial prominence. There is no
evidence of focal airspace consolidation, pleural effusion,
pulmonary edema or pneumothorax. No acute osseous abnormality.
Procedural changes of prior lower thoracic cement augmentation.
IMPRESSION: Stable chest x-ray without evidence of acute cardiopulmonary
process.

Aortic Atherosclerosis ([id number])

Imaging findings suggestive of underlying COPD.

## 2020-07-20 ENCOUNTER — Other Ambulatory Visit: Payer: Self-pay | Admitting: Family Medicine

## 2020-07-28 MED ORDER — ALBUTEROL SULFATE (2.5 MG/3ML) 0.083% IN NEBU: INHALATION_SOLUTION | RESPIRATORY_TRACT | 0 refills | Status: DC

## 2020-07-28 NOTE — Addendum Note (Signed)
Addended by: Towana Badger on: 07/28/2020 10:14 AM   Modules accepted: Orders

## 2020-07-28 NOTE — Telephone Encounter (Signed)
Patients son in law called stating [facility] was not able to order the albuterol and they recommended sending to new pharmacy.   I have sent RX to [facility] and let son in law know that patient needs to make a follow up post hospitalization from October, he confirms he will have patient call back and schedule.

## 2020-08-12 ENCOUNTER — Other Ambulatory Visit: Payer: Self-pay | Admitting: Family Medicine

## 2020-08-13 ENCOUNTER — Other Ambulatory Visit: Payer: Self-pay | Admitting: Family Medicine

## 2020-08-21 ENCOUNTER — Other Ambulatory Visit: Payer: Self-pay | Admitting: Family Medicine

## 2020-08-21 DIAGNOSIS — J441 Chronic obstructive pulmonary disease with (acute) exacerbation: Secondary | ICD-10-CM

## 2020-08-26 ENCOUNTER — Ambulatory Visit (INDEPENDENT_AMBULATORY_CARE_PROVIDER_SITE_OTHER): Payer: Medicare Other | Admitting: Family Medicine

## 2020-08-26 ENCOUNTER — Encounter: Payer: Self-pay | Admitting: Family Medicine

## 2020-08-26 ENCOUNTER — Other Ambulatory Visit: Payer: Self-pay

## 2020-08-26 ENCOUNTER — Other Ambulatory Visit: Payer: Self-pay | Admitting: Family Medicine

## 2020-08-26 VITALS — BP 139/75 | HR 91

## 2020-08-26 DIAGNOSIS — E119 Type 2 diabetes mellitus without complications: Secondary | ICD-10-CM | POA: Diagnosis not present

## 2020-08-26 DIAGNOSIS — J441 Chronic obstructive pulmonary disease with (acute) exacerbation: Secondary | ICD-10-CM

## 2020-08-26 DIAGNOSIS — E099 Drug or chemical induced diabetes mellitus without complications: Secondary | ICD-10-CM | POA: Diagnosis not present

## 2020-08-26 LAB — POCT GLYCOSYLATED HEMOGLOBIN (HGB A1C): Hemoglobin A1C: 5.2 % (ref 4.0–5.6)

## 2020-08-26 MED ORDER — ALBUTEROL SULFATE (2.5 MG/3ML) 0.083% IN NEBU: INHALATION_SOLUTION | RESPIRATORY_TRACT | 0 refills | Status: DC

## 2020-08-26 MED ORDER — CEFTRIAXONE SODIUM 1 G IJ SOLR
1.0000 g | Freq: Once | INTRAMUSCULAR | Status: AC
  Administered 2020-08-26: 15:00:00 1 g via INTRAMUSCULAR

## 2020-08-26 MED ORDER — METHYLPREDNISOLONE ACETATE 80 MG/ML IJ SUSP
80.0000 mg | Freq: Once | INTRAMUSCULAR | Status: AC
  Administered 2020-08-26: 15:00:00 80 mg via INTRAMUSCULAR

## 2020-08-26 MED ORDER — GLIPIZIDE ER 2.5 MG PO TB24: 2.5000 mg | ORAL_TABLET | Freq: Every day | ORAL | 0 refills | Status: DC

## 2020-08-26 MED ORDER — DOXYCYCLINE HYCLATE 100 MG PO TABS: 100.0000 mg | ORAL_TABLET | Freq: Two times a day (BID) | ORAL | 0 refills | Status: DC

## 2020-08-26 NOTE — Assessment & Plan Note (Signed)
Blood sugars have been running in the 190s more recently.  We discussed restarting a low-dose of glipizide he would feel more comfortable with that even though his A1c looks phenomenal today and a little bit worried about lows just encouraged his son-in-law to really keep a close eye on it.

## 2020-08-26 NOTE — Progress Notes (Signed)
Coughing up green mucus x 3 wks. No fevers.   BS have been in the 190's.

## 2020-08-26 NOTE — Progress Notes (Signed)
Established Patient Office Visit  Subjective:  Patient ID: Jared Lee, male    DOB: 1941/08/13  Age: 79 y.o. MRN: [id number]  CC:  Chief Complaint  Patient presents with  . Diabetes    HPI Jared Lee presents for f/U hospital admission at [facility] on June 03, 2020.  For COPD exacerbation.  Was diagnosed with acute respiratory failure he has COPD with bullous emphysema with an FEV1 of 27%.  He has had multiple prior admissions he is currently on 2 L of oxygen but sometimes will turn it up to 4 to 5 L.  In the hospitalization he received 6 days of IV doxycycline and Solu-Medrol.  He was also given some oral prednisone to taper at discharge.  Told to continue with his albuterol and Spiriva as well.  He reports he is using his inhalers and his albuterol he did have a problem getting his albuterol we had sent a 90-day supply but he says the pharmacy only gave him a 30-day supply.  Reports that for the last 2 to 3 weeks he has had increasing cough and sputum production with greenish colored sputum.  No fevers chills or sweats.  He has been more short of breath.  He also reports that his chest has been hurting anteriorly and between his shoulder blades because it is hard to breathe.  He has been wearing his oxygen regularly.  His son-in-law who is with him today reports that his blood sugars have been elevated in the 190s fasting in the mornings he says ever since we stopped the glipizide its been going higher probably because of his frequent prednisone use.   Past Medical History:  Diagnosis Date  . COPD (chronic obstructive pulmonary disease) ([place])   . H/O colonoscopy 02/03/12   EGD as well. Mildy nodular gastritis.    . Hyperlipemia   . Hypertension   . Normal cardiac stress test 04/14/12   High POint REgional    Past Surgical History:  Procedure Laterality Date  . CATARACT EXTRACTION Right 03/15/2016  . CATARACT EXTRACTION W/ INTRAOCULAR LENS IMPLANT Left 04/05/2016  . IR  GENERIC HISTORICAL  08/04/2016   IR RADIOLOGIST EVAL & MGMT 08/04/2016 [name], MD [facility] INTERV RAD    Family History  Problem Relation Age of Onset  . Heart disease Father   . Hypertension Father   . Emphysema Father   . Emphysema Paternal Grandfather     Social History   Socioeconomic History  . Marital status: Single    Spouse name: Not on file  . Number of children: Not on file  . Years of education: Not on file  . Highest education level: Not on file  Occupational History  . Not on file  Tobacco Use  . Smoking status: Former Smoker    Packs/day: 1.00    Years: 66.00    Pack years: 66.00    Types: Cigarettes    Quit date: 08/31/2011    Years since quitting: 8.9  . Smokeless tobacco: Never Used  Substance and Sexual Activity  . Alcohol use: No  . Drug use: No  . Sexual activity: Not on file  Other Topics Concern  . Not on file  Social History Narrative  . Not on file   Social Determinants of Health   Financial Resource Strain: Not on file  Food Insecurity: Not on file  Transportation Needs: Not on file  Physical Activity: Not on file  Stress: Not on file  Social Connections:  Not on file  Intimate Partner Violence: Not on file    Outpatient Medications Prior to Visit  Medication Sig Dispense Refill  . AMBULATORY NON FORMULARY MEDICATION Medication Name: Ambulatory oxygen cocentrator.  Pt is mobile within th home.  Pt requests portable concentrator. [facility] for [facility] needs. 1 Units 0  . AMBULATORY NON FORMULARY MEDICATION Medication Name: Needs oxygen tubing for his nebulizer as well as his chronic oxygen therapy.  [facility]. Dx COPD 10 Units PRN  . aspirin 81 MG tablet Take 81 mg by mouth daily.    . Blood Glucose Monitoring Suppl (BLOOD GLUCOSE MONITOR SYSTEM) W/DEVICE KIT Check twice a day. 1 each 0  . Calcium Carb-Cholecalciferol (CALCIUM-VITAMIN D) 500-200 MG-UNIT tablet Take 1 tablet by mouth daily.    .  colestipol (COLESTID) 1 g tablet TAKE 1 TABLET BY MOUTH TWICE DAILY AS NEEDED FOR DIARRHEA 180 tablet 0  . Ferrous Sulfate (IRON) 325 (65 Fe) MG TABS Take 1 tablet by mouth once daily with breakfast 90 tablet 0  . lisinopril (ZESTRIL) 5 MG tablet Take 1 tablet by mouth once daily 90 tablet 0  . metoprolol tartrate (LOPRESSOR) 100 MG tablet Take 1 tablet by mouth twice daily 180 tablet 0  . pravastatin (PRAVACHOL) 20 MG tablet Take 1 tablet by mouth once daily 90 tablet 3  . predniSONE (DELTASONE) 10 MG tablet TAKE 4 TABLETS DAILY FOR 2 DAYS, 3 DAILY FOR 2 DAYS, 2 DAILY FOR 2 DAYS, 1 DAILY FOR 2 DAYS 90 tablet 0  . SPIRIVA HANDIHALER 18 MCG inhalation capsule INHALE CONTENTS OF ONE CAPSULE VIA HANDIHALER ONCE DAILY 90 capsule 1  . VENTOLIN HFA 108 (90 Base) MCG/ACT inhaler INHALE 2 PUFFS BY MOUTH EVERY 6 HOURS AS NEEDED FOR WHEEZING OR  SHORTNESS  OF  BREATH 54 each 3  . albuterol (PROVENTIL) (2.5 MG/3ML) 0.083% nebulizer solution USE 1 VIAL IN NEBULIZER EVERY 4 HOURS AS NEEDED FOR WHEEZING AND FOR SHORTNESS OF BREATH 525 mL 0  . budesonide-formoterol (SYMBICORT) 160-4.5 MCG/ACT inhaler Inhale 2 puffs into the lungs 2 (two) times daily. 3 Inhaler 1  . ipratropium (ATROVENT) 0.02 % nebulizer solution Take 2.5 mLs (0.5 mg total) by nebulization every 6 (six) hours as needed for wheezing or shortness of breath. Dx COPD. ICD-9 Code 496. 75 mL 12  . lansoprazole (PREVACID) 30 MG capsule Take 1 capsule (30 mg total) by mouth in the morning. 90 capsule 1  . sodium polystyrene (KAYEXALATE) 15 GM/60ML suspension Take 60 mLs (15 g total) by mouth 2 (two) times a week. 240 mL 3   No facility-administered medications prior to visit.    Allergies  Allergen Reactions  . Chlorpromazine Swelling  . Fluticasone-Salmeterol Swelling  . Augmentin [Amoxicillin-Pot Clavulanate] Nausea And Vomiting    Stomach pain as well.   . Azithromycin Diarrhea    severe  . Daliresp [Roflumilast] Other (See Comments)    HA,  diarrhea   . Levaquin [Levofloxacin]   . Prednisone Other (See Comments)    Swelling    ROS Review of Systems    Objective:    Physical Exam Constitutional:      Appearance: He is well-developed.  HENT:     Head: Normocephalic and atraumatic.     Right Ear: Tympanic membrane, ear canal and external ear normal.     Left Ear: Tympanic membrane, ear canal and external ear normal.     Nose: Nose normal.     Mouth/Throat:  Mouth: Mucous membranes are moist.     Pharynx: Oropharynx is clear. No posterior oropharyngeal erythema.  Eyes:     Conjunctiva/sclera: Conjunctivae normal.     Pupils: Pupils are equal, round, and reactive to light.  Neck:     Thyroid: No thyromegaly.  Cardiovascular:     Rate and Rhythm: Normal rate.     Heart sounds: Normal heart sounds.  Pulmonary:     Comments: He is posturing for breath he is wearing his oxygen today.  Crackles in the posterior left lower lobe. Musculoskeletal:     Cervical back: Neck supple.  Lymphadenopathy:     Cervical: No cervical adenopathy.  Skin:    General: Skin is warm and dry.  Neurological:     Mental Status: He is alert and oriented to person, place, and time.  Psychiatric:        Mood and Affect: Mood normal.     BP 139/75   Pulse 91   SpO2 96% Comment: 3L Wt Readings from Last 3 Encounters:  06/14/19 147 lb (66.7 kg)  11/16/18 143 lb (64.9 kg)  04/22/17 133 lb (60.3 kg)     Health Maintenance Due  Topic Date Due  . FOOT EXAM  06/13/2020    There are no preventive care reminders to display for this patient.  Lab Results  Component Value Date   TSH 1.436 12/31/2009   Lab Results  Component Value Date   WBC 7.7 05/26/2020   HGB 10.5 (L) 05/26/2020   HCT 32.5 (L) 05/26/2020   MCV 83.1 05/26/2020   PLT 194 05/26/2020   Lab Results  Component Value Date   NA 142 05/26/2020   K 5.4 (H) 05/26/2020   CO2 29 05/26/2020   GLUCOSE 190 (H) 05/26/2020   BUN 34 (H) 05/26/2020   CREATININE 1.03  05/26/2020   BILITOT 0.3 05/26/2020   ALKPHOS 58 01/22/2016   AST 19 05/26/2020   ALT 14 05/26/2020   PROT 6.0 (L) 05/26/2020   ALBUMIN 3.8 01/22/2016   CALCIUM 9.1 05/26/2020   Lab Results  Component Value Date   CHOL 224 (H) 05/26/2020   Lab Results  Component Value Date   HDL 98 05/26/2020   Lab Results  Component Value Date   LDLCALC 109 (H) 05/26/2020   Lab Results  Component Value Date   TRIG 77 05/26/2020   Lab Results  Component Value Date   CHOLHDL 2.3 05/26/2020   Lab Results  Component Value Date   HGBA1C 5.2 08/26/2020      Assessment & Plan:   Problem List Items Addressed This Visit      Respiratory   COPD exacerbation ([place])    Discussed dx. Given DepoMedrol 42m and 1g Rocephin.  Will give a prescription for doxycycline to the pharmacy. Has low dose prednisone at home for now. Go to the ED if not responding or getting worse.        Relevant Medications   albuterol (PROVENTIL) (2.5 MG/3ML) 0.083% nebulizer solution     Endocrine   Chemical induced diabetes mellitus ([place]    Blood sugars have been running in the 190s more recently.  We discussed restarting a low-dose of glipizide he would feel more comfortable with that even though his A1c looks phenomenal today and a little bit worried about lows just encouraged his son-in-law to really keep a close eye on it.      Relevant Medications   glipiZIDE (GLUCOTROL XL) 2.5 MG 24 hr tablet  Other Visit Diagnoses    Type 2 diabetes mellitus without complication, without long-term current use of insulin (HCC)    -  Primary   Relevant Medications   glipiZIDE (GLUCOTROL XL) 2.5 MG 24 hr tablet   Other Relevant Orders   POCT glycosylated hemoglobin (Hb A1C) (Completed)      Meds ordered this encounter  Medications  . glipiZIDE (GLUCOTROL XL) 2.5 MG 24 hr tablet    Sig: Take 1 tablet (2.5 mg total) by mouth daily with breakfast.    Dispense:  90 tablet    Refill:  0  . doxycycline (VIBRA-TABS)  100 MG tablet    Sig: Take 1 tablet (100 mg total) by mouth 2 (two) times daily.    Dispense:  20 tablet    Refill:  0  . methylPREDNISolone acetate (DEPO-MEDROL) injection 80 mg  . cefTRIAXone (ROCEPHIN) injection 1 g    Order Specific Question:   Antibiotic Indication:    Answer:   Other Indication (list below)    Order Specific Question:   Other Indication:    Answer:   copd  . albuterol (PROVENTIL) (2.5 MG/3ML) 0.083% nebulizer solution    Sig: USE 1 VIAL IN NEBULIZER EVERY 4 HOURS AS NEEDED FOR WHEEZING AND FOR SHORTNESS OF BREATH. 90 days supply    Dispense:  525 mL    Refill:  0    Follow-up: Return in about 6 months (around 02/24/2021) for dm and htn.    Beatrice Lecher, MD

## 2020-08-26 NOTE — Assessment & Plan Note (Signed)
Discussed dx. Given DepoMedrol 80mg  and 1g Rocephin.  Will give a prescription for doxycycline to the pharmacy. Has low dose prednisone at home for now. Go to the ED if not responding or getting worse.

## 2020-09-08 ENCOUNTER — Other Ambulatory Visit: Payer: Self-pay | Admitting: Family Medicine

## 2020-09-08 DIAGNOSIS — J441 Chronic obstructive pulmonary disease with (acute) exacerbation: Secondary | ICD-10-CM

## 2020-09-26 ENCOUNTER — Other Ambulatory Visit: Payer: Self-pay | Admitting: *Deleted

## 2020-09-26 MED ORDER — PREDNISONE 10 MG PO TABS: ORAL_TABLET | ORAL | 0 refills | Status: DC

## 2020-10-13 ENCOUNTER — Other Ambulatory Visit: Payer: Self-pay | Admitting: *Deleted

## 2020-10-13 DIAGNOSIS — J441 Chronic obstructive pulmonary disease with (acute) exacerbation: Secondary | ICD-10-CM

## 2020-10-13 MED ORDER — ALBUTEROL SULFATE (2.5 MG/3ML) 0.083% IN NEBU: INHALATION_SOLUTION | RESPIRATORY_TRACT | 1 refills | Status: DC

## 2020-11-13 ENCOUNTER — Other Ambulatory Visit: Payer: Self-pay | Admitting: *Deleted

## 2020-11-13 ENCOUNTER — Other Ambulatory Visit: Payer: Self-pay | Admitting: Family Medicine

## 2020-11-13 MED ORDER — VENTOLIN HFA 108 (90 BASE) MCG/ACT IN AERS: INHALATION_SPRAY | RESPIRATORY_TRACT | 3 refills | Status: AC

## 2020-11-21 ENCOUNTER — Other Ambulatory Visit: Payer: Self-pay | Admitting: Family Medicine

## 2020-11-24 ENCOUNTER — Ambulatory Visit: Payer: Medicare Other | Admitting: Family Medicine

## 2020-12-02 ENCOUNTER — Other Ambulatory Visit: Payer: Self-pay | Admitting: Family Medicine

## 2020-12-15 ENCOUNTER — Other Ambulatory Visit: Payer: Self-pay

## 2020-12-15 DIAGNOSIS — J441 Chronic obstructive pulmonary disease with (acute) exacerbation: Secondary | ICD-10-CM

## 2020-12-15 MED ORDER — ALBUTEROL SULFATE (2.5 MG/3ML) 0.083% IN NEBU: INHALATION_SOLUTION | RESPIRATORY_TRACT | 1 refills | Status: DC

## 2020-12-26 ENCOUNTER — Telehealth: Payer: Self-pay | Admitting: Family Medicine

## 2020-12-26 ENCOUNTER — Encounter: Payer: Self-pay | Admitting: Family Medicine

## 2020-12-26 ENCOUNTER — Other Ambulatory Visit: Payer: Self-pay

## 2020-12-26 ENCOUNTER — Ambulatory Visit (INDEPENDENT_AMBULATORY_CARE_PROVIDER_SITE_OTHER): Payer: Medicare Other | Admitting: Family Medicine

## 2020-12-26 VITALS — BP 123/73 | HR 96

## 2020-12-26 DIAGNOSIS — J9611 Chronic respiratory failure with hypoxia: Secondary | ICD-10-CM | POA: Diagnosis not present

## 2020-12-26 DIAGNOSIS — E099 Drug or chemical induced diabetes mellitus without complications: Secondary | ICD-10-CM

## 2020-12-26 DIAGNOSIS — E611 Iron deficiency: Secondary | ICD-10-CM

## 2020-12-26 DIAGNOSIS — J441 Chronic obstructive pulmonary disease with (acute) exacerbation: Secondary | ICD-10-CM

## 2020-12-26 DIAGNOSIS — I700 Atherosclerosis of aorta: Secondary | ICD-10-CM | POA: Diagnosis not present

## 2020-12-26 DIAGNOSIS — I10 Essential (primary) hypertension: Secondary | ICD-10-CM | POA: Diagnosis not present

## 2020-12-26 DIAGNOSIS — E119 Type 2 diabetes mellitus without complications: Secondary | ICD-10-CM | POA: Diagnosis not present

## 2020-12-26 LAB — POCT GLYCOSYLATED HEMOGLOBIN (HGB A1C): Hemoglobin A1C: 5.3 % (ref 4.0–5.6)

## 2020-12-26 MED ORDER — LISINOPRIL 5 MG PO TABS: 5.0000 mg | ORAL_TABLET | Freq: Every day | ORAL | 1 refills | Status: AC

## 2020-12-26 MED ORDER — ALBUTEROL SULFATE (2.5 MG/3ML) 0.083% IN NEBU: INHALATION_SOLUTION | RESPIRATORY_TRACT | 5 refills | Status: DC

## 2020-12-26 MED ORDER — GLIPIZIDE ER 2.5 MG PO TB24: 2.5000 mg | ORAL_TABLET | Freq: Every day | ORAL | 1 refills | Status: AC

## 2020-12-26 MED ORDER — METOPROLOL TARTRATE 100 MG PO TABS: 100.0000 mg | ORAL_TABLET | Freq: Two times a day (BID) | ORAL | 1 refills | Status: AC

## 2020-12-26 MED ORDER — PREDNISONE 10 MG PO TABS: ORAL_TABLET | ORAL | 0 refills | Status: DC

## 2020-12-26 NOTE — Patient Instructions (Signed)
Please stop the glipizide 

## 2020-12-26 NOTE — Addendum Note (Signed)
Addended by: Teddy Spike on: 12/26/2020 02:53 PM   Modules accepted: Orders

## 2020-12-26 NOTE — Assessment & Plan Note (Signed)
We will call [facility] and figure out why he is unable to get his albuterol.  I did update the prescriptions that it reads every 3 hours which is how he is actually doing it he even wakes up in the middle the night to use his albuterol but it does keep him out of the hospital.  We will also get up-to-date labs.  Continue with daily low-dose prednisone.

## 2020-12-26 NOTE — Assessment & Plan Note (Signed)
We had discussed discontinuing the glipizide because the A1c is 5.3.  But his son-in-law who is with him says that if he does not take it that his sugars will shoot up into 200s with taking the daily prednisone.  So he would prefer to continue it they have not noticed any hypoglycemic events.

## 2020-12-26 NOTE — Progress Notes (Signed)
Established Patient Office Visit  Subjective:  Patient ID: Jared Lee, male    DOB: 1941/05/13  Age: 80 y.o. MRN: [id number]  CC:  Chief Complaint  Patient presents with  . COPD  . Diabetes    HPI Jared Lee presents for F/U COPD   Diabetes - no hypoglycemic events. No wounds or sores that are not healing well. No increased thirst or urination. Checking glucose at home. Taking medications as prescribed without any side effects.  Follow-up COPD-last hospitalization was actually 6 months ago which is fantastic for [name].  Last labs were performed then.  Past Medical History:  Diagnosis Date  . COPD (chronic obstructive pulmonary disease) ([place])   . H/O colonoscopy 02/03/12   EGD as well. Mildy nodular gastritis.    . Hyperlipemia   . Hypertension   . Normal cardiac stress test 04/14/12   High POint REgional    Past Surgical History:  Procedure Laterality Date  . CATARACT EXTRACTION Right 03/15/2016  . CATARACT EXTRACTION W/ INTRAOCULAR LENS IMPLANT Left 04/05/2016  . IR GENERIC HISTORICAL  08/04/2016   IR RADIOLOGIST EVAL & MGMT 08/04/2016 [name], MD [facility] INTERV RAD    Family History  Problem Relation Age of Onset  . Heart disease Father   . Hypertension Father   . Emphysema Father   . Emphysema Paternal Grandfather     Social History   Socioeconomic History  . Marital status: Single    Spouse name: Not on file  . Number of children: Not on file  . Years of education: Not on file  . Highest education level: Not on file  Occupational History  . Not on file  Tobacco Use  . Smoking status: Former Smoker    Packs/day: 1.00    Years: 66.00    Pack years: 66.00    Types: Cigarettes    Quit date: 08/31/2011    Years since quitting: 9.3  . Smokeless tobacco: Never Used  Substance and Sexual Activity  . Alcohol use: No  . Drug use: No  . Sexual activity: Not on file  Other Topics Concern  . Not on file  Social History Narrative  . Not on file    Social Determinants of Health   Financial Resource Strain: Not on file  Food Insecurity: Not on file  Transportation Needs: Not on file  Physical Activity: Not on file  Stress: Not on file  Social Connections: Not on file  Intimate Partner Violence: Not on file    Outpatient Medications Prior to Visit  Medication Sig Dispense Refill  . AMBULATORY NON FORMULARY MEDICATION Medication Name: Ambulatory oxygen cocentrator.  Pt is mobile within th home.  Pt requests portable concentrator. Using [facility] for [facility] needs. 1 Units 0  . AMBULATORY NON FORMULARY MEDICATION Medication Name: Needs oxygen tubing for his nebulizer as well as his chronic oxygen therapy.  [facility]. Dx COPD 10 Units PRN  . aspirin 81 MG tablet Take 81 mg by mouth daily.    . Blood Glucose Monitoring Suppl (BLOOD GLUCOSE MONITOR SYSTEM) W/DEVICE KIT Check twice a day. 1 each 0  . Calcium Carb-Cholecalciferol (CALCIUM-VITAMIN D) 500-200 MG-UNIT tablet Take 1 tablet by mouth daily.    . colestipol (COLESTID) 1 g tablet TAKE 1 TABLET BY MOUTH TWICE DAILY AS NEEDED FOR DIARRHEA 180 tablet 0  . Ferrous Sulfate (IRON) 325 (65 Fe) MG TABS Take 1 tablet by mouth once daily with breakfast 90 tablet 0  .  pravastatin (PRAVACHOL) 20 MG tablet Take 1 tablet by mouth once daily 90 tablet 3  . SPIRIVA HANDIHALER 18 MCG inhalation capsule INHALE CONTENTS OF ONE CAPSULE VIA HANDIHALER ONCE DAILY 90 capsule 1  . VENTOLIN HFA 108 (90 Base) MCG/ACT inhaler INHALE 2 PUFFS BY MOUTH EVERY 6 HOURS AS NEEDED FOR WHEEZING OR  SHORTNESS  OF  BREATH 54 each 3  . albuterol (PROVENTIL) (2.5 MG/3ML) 0.083% nebulizer solution USE 1 VIAL IN NEBULIZER EVERY 4 HOURS AS NEEDED FOR WHEEZING AND FOR SHORTNESS OF BREATH. 90 days supply 525 mL 1  . glipiZIDE (GLUCOTROL XL) 2.5 MG 24 hr tablet Take 1 tablet by mouth once daily with breakfast 90 tablet 0  . lisinopril (ZESTRIL) 5 MG tablet Take 1 tablet by mouth once daily 90 tablet  0  . metoprolol tartrate (LOPRESSOR) 100 MG tablet Take 1 tablet by mouth twice daily 180 tablet 0   No facility-administered medications prior to visit.    Allergies  Allergen Reactions  . Chlorpromazine Swelling  . Fluticasone-Salmeterol Swelling  . Augmentin [Amoxicillin-Pot Clavulanate] Nausea And Vomiting    Stomach pain as well.   . Azithromycin Diarrhea    severe  . Daliresp [Roflumilast] Other (See Comments)    HA, diarrhea   . Levaquin [Levofloxacin]   . Prednisone Other (See Comments)    Swelling    ROS Review of Systems    Objective:    Physical Exam Constitutional:      Appearance: He is well-developed.  HENT:     Head: Normocephalic and atraumatic.  Cardiovascular:     Rate and Rhythm: Normal rate and regular rhythm.     Heart sounds: Normal heart sounds.  Pulmonary:     Effort: Pulmonary effort is normal.     Breath sounds: Normal breath sounds.  Skin:    General: Skin is warm and dry.  Neurological:     Mental Status: He is alert and oriented to person, place, and time.  Psychiatric:        Behavior: Behavior normal.     BP 123/73   Pulse 96   SpO2 94% Comment: 2.5L Wt Readings from Last 3 Encounters:  06/14/19 147 lb (66.7 kg)  11/16/18 143 lb (64.9 kg)  04/22/17 133 lb (60.3 kg)     Health Maintenance Due  Topic Date Due  . FOOT EXAM  06/13/2020    There are no preventive care reminders to display for this patient.  Lab Results  Component Value Date   TSH 1.436 12/31/2009   Lab Results  Component Value Date   WBC 7.7 05/26/2020   HGB 10.5 (L) 05/26/2020   HCT 32.5 (L) 05/26/2020   MCV 83.1 05/26/2020   PLT 194 05/26/2020   Lab Results  Component Value Date   NA 142 05/26/2020   K 5.4 (H) 05/26/2020   CO2 29 05/26/2020   GLUCOSE 190 (H) 05/26/2020   BUN 34 (H) 05/26/2020   CREATININE 1.03 05/26/2020   BILITOT 0.3 05/26/2020   ALKPHOS 58 01/22/2016   AST 19 05/26/2020   ALT 14 05/26/2020   PROT 6.0 (L) 05/26/2020    ALBUMIN 3.8 01/22/2016   CALCIUM 9.1 05/26/2020   Lab Results  Component Value Date   CHOL 224 (H) 05/26/2020   Lab Results  Component Value Date   HDL 98 05/26/2020   Lab Results  Component Value Date   LDLCALC 109 (H) 05/26/2020   Lab Results  Component Value Date   TRIG 77  05/26/2020   Lab Results  Component Value Date   CHOLHDL 2.3 05/26/2020   Lab Results  Component Value Date   HGBA1C 5.3 12/26/2020      Assessment & Plan:   Problem List Items Addressed This Visit      Cardiovascular and Mediastinum   HYPERTENSION, BENIGN - Primary    Well controlled. Continue current regimen. Follow up in  79mo     Relevant Medications   lisinopril (ZESTRIL) 5 MG tablet   metoprolol tartrate (LOPRESSOR) 100 MG tablet   Other Relevant Orders   COMPLETE METABOLIC PANEL WITH GFR   CBC   Atherosclerosis of aorta (HCC)   Relevant Medications   lisinopril (ZESTRIL) 5 MG tablet   metoprolol tartrate (LOPRESSOR) 100 MG tablet     Respiratory   COPD exacerbation (HCC)   Relevant Medications   albuterol (PROVENTIL) (2.5 MG/3ML) 0.083% nebulizer solution   Chronic respiratory failure with hypoxia (HCC)    We will call [facility] and figure out why he is unable to get his albuterol.  I did update the prescriptions that it reads every 3 hours which is how he is actually doing it he even wakes up in the middle the night to use his albuterol but it does keep him out of the hospital.  We will also get up-to-date labs.  Continue with daily low-dose prednisone.      Relevant Medications   albuterol (PROVENTIL) (2.5 MG/3ML) 0.083% nebulizer solution     Endocrine   Chemical induced diabetes mellitus ([place]    We had discussed discontinuing the glipizide because the A1c is 5.3.  But his son-in-law who is with him says that if he does not take it that his sugars will shoot up into 200s with taking the daily prednisone.  So he would prefer to continue it they have not noticed any  hypoglycemic events.      Relevant Medications   lisinopril (ZESTRIL) 5 MG tablet   glipiZIDE (GLUCOTROL XL) 2.5 MG 24 hr tablet     Other   Iron deficiency   Relevant Orders   CBC   Fe+TIBC+Fer    Other Visit Diagnoses    Type 2 diabetes mellitus without complication, without long-term current use of insulin (HCC)       Relevant Medications   lisinopril (ZESTRIL) 5 MG tablet   glipiZIDE (GLUCOTROL XL) 2.5 MG 24 hr tablet   Other Relevant Orders   POCT glycosylated hemoglobin (Hb A1C) (Completed)   COMPLETE METABOLIC PANEL WITH GFR   CBC   Fe+TIBC+Fer   B12      Meds ordered this encounter  Medications  . albuterol (PROVENTIL) (2.5 MG/3ML) 0.083% nebulizer solution    Sig: USE 1 VIAL IN NEBULIZER EVERY 3 HOURS AS NEEDED FOR WHEEZING AND FOR SHORTNESS OF BREATH. 90 days supply    Dispense:  720 mL    Refill:  5  . lisinopril (ZESTRIL) 5 MG tablet    Sig: Take 1 tablet (5 mg total) by mouth daily.    Dispense:  90 tablet    Refill:  1  . metoprolol tartrate (LOPRESSOR) 100 MG tablet    Sig: Take 1 tablet (100 mg total) by mouth 2 (two) times daily.    Dispense:  180 tablet    Refill:  1  . glipiZIDE (GLUCOTROL XL) 2.5 MG 24 hr tablet    Sig: Take 1 tablet (2.5 mg total) by mouth daily with breakfast.    Dispense:  90  tablet    Refill:  1    Follow-up: No follow-ups on file.   I spent 42 minutes on the day of the encounter to include pre-visit record review, face-to-face time with the patient and post visit ordering of test.    Beatrice Lecher, MD

## 2020-12-26 NOTE — Assessment & Plan Note (Signed)
Well controlled. Continue current regimen. Follow up in  6 mo  

## 2020-12-26 NOTE — Telephone Encounter (Signed)
Please call his [facility] and see what is going on with his albuterol.  He says he keeps having problems getting it filled he says that he is told that it looks like he is in the hospital but is really not.  So I am not sure what is actually happening.  Or if they are not wanting to pay for every 3 hours which she actually does even wakes up in the middle the night because he has severe COPD and it really does keep him out of the hospital.  So not sure if maybe we need a prior authorization because of the quantity he is using on a regular basis but if we can just confirm with the pharmacist what they need from [place] to help him make sure that he is getting his medication on time.

## 2020-12-27 LAB — CBC
HCT: 34.6 % — ABNORMAL LOW (ref 38.5–50.0)
Hemoglobin: 11.1 g/dL — ABNORMAL LOW (ref 13.2–17.1)
MCH: 27.9 pg (ref 27.0–33.0)
MCHC: 32.1 g/dL (ref 32.0–36.0)
MCV: 86.9 fL (ref 80.0–100.0)
MPV: 11.6 fL (ref 7.5–12.5)
Platelets: 216 10*3/uL (ref 140–400)
RBC: 3.98 10*6/uL — ABNORMAL LOW (ref 4.20–5.80)
RDW: 14.4 % (ref 11.0–15.0)
WBC: 9.3 10*3/uL (ref 3.8–10.8)

## 2020-12-27 LAB — COMPLETE METABOLIC PANEL WITH GFR
AG Ratio: 1.8 (calc) (ref 1.0–2.5)
ALT: 12 U/L (ref 9–46)
AST: 19 U/L (ref 10–35)
Albumin: 3.7 g/dL (ref 3.6–5.1)
Alkaline phosphatase (APISO): 72 U/L (ref 35–144)
BUN/Creatinine Ratio: 41 (calc) — ABNORMAL HIGH (ref 6–22)
BUN: 40 mg/dL — ABNORMAL HIGH (ref 7–25)
CO2: 25 mmol/L (ref 20–32)
Calcium: 9.3 mg/dL (ref 8.6–10.3)
Chloride: 105 mmol/L (ref 98–110)
Creat: 0.98 mg/dL (ref 0.70–1.11)
GFR, Est African American: 83 mL/min/{1.73_m2} (ref >=60)
GFR, Est Non African American: 72 mL/min/{1.73_m2} (ref >=60)
Globulin: 2.1 g/dL (calc) (ref 1.9–3.7)
Glucose, Bld: 255 mg/dL — ABNORMAL HIGH (ref 65–99)
Potassium: 5.9 mmol/L — ABNORMAL HIGH (ref 3.5–5.3)
Sodium: 138 mmol/L (ref 135–146)
Total Bilirubin: 0.3 mg/dL (ref 0.2–1.2)
Total Protein: 5.8 g/dL — ABNORMAL LOW (ref 6.1–8.1)

## 2020-12-27 LAB — IRON,TIBC AND FERRITIN PANEL
%SAT: 27 % (calc) (ref 20–48)
Ferritin: 24 ng/mL (ref 24–380)
Iron: 89 ug/dL (ref 50–180)
TIBC: 325 mcg/dL (calc) (ref 250–425)

## 2020-12-27 LAB — VITAMIN B12: Vitamin B-12: 360 pg/mL (ref 200–1100)

## 2020-12-29 ENCOUNTER — Other Ambulatory Visit: Payer: Self-pay | Admitting: Family Medicine

## 2020-12-29 DIAGNOSIS — J9611 Chronic respiratory failure with hypoxia: Secondary | ICD-10-CM

## 2020-12-31 NOTE — Telephone Encounter (Signed)
Spoke with pharmacy this afternoon.  There is something going on with his ins company where they somehow have him listed as being in a facility and are requiring discharge paperwork.  It is a hard stop with the pharmacy because they are literally refusing to pay for it at an outpatient pharmacy.  Pt has called ins company to have them take this off so now we are all just waiting for that to happen.

## 2021-01-01 NOTE — Telephone Encounter (Signed)
Okay, that makes sense with what the son-in-law told me.  I just never had heard of this happening before so it was really confusing and I just wanted to make sure it was not something we were doing because he is been going without his medication which of course then means he ends up in the emergency room.  Lysle Morales, is there any way that you could provide any assistance or thoughts on this issue.

## 2021-01-02 ENCOUNTER — Other Ambulatory Visit: Payer: Self-pay | Admitting: *Deleted

## 2021-01-02 DIAGNOSIS — E875 Hyperkalemia: Secondary | ICD-10-CM

## 2021-01-02 NOTE — Telephone Encounter (Signed)
Thank you so much!!!! I did check with the front and his insurance did e-verify in April so it is accurate even though the card scanned is old.  I did ask front to work on getting an updated card on file.

## 2021-01-16 ENCOUNTER — Telehealth: Payer: Self-pay | Admitting: Family Medicine

## 2021-01-16 NOTE — Chronic Care Management (AMB) (Signed)
  Chronic Care Management   Outreach Note  01/16/2021 Name: ROBYN NOHR MRN: [id number] DOB: November 07, 1940  Referred by: Hali Marry, MD Reason for referral : No chief complaint on file.   An unsuccessful telephone outreach was attempted today. The patient was referred to the pharmacist for assistance with care management and care coordination.   Follow Up Plan:   Lauretta Grill [employer] Scheduler

## 2021-01-20 ENCOUNTER — Other Ambulatory Visit: Payer: Self-pay | Admitting: Family Medicine

## 2021-01-22 ENCOUNTER — Telehealth: Payer: Self-pay | Admitting: Neurology

## 2021-01-22 NOTE — Telephone Encounter (Signed)
Prior Authorization for Spiriva HandiHaler 18MCG capsules submitted via covermymeds. Awaiting response. Your information has been submitted to [place] [employer]. [employer] will review the request and will issue a decision, typically within 1-3 days from your submission. You can check the updated outcome later by reopening this request.  If [employer] has not responded in 1-3 days or if you have any questions about your ePA request, please contact [place] [employer] Part D at [phone]. If you think there may be a problem with your PA request, use our live chat feature at the bottom right.

## 2021-02-02 ENCOUNTER — Telehealth: Payer: Self-pay | Admitting: Family Medicine

## 2021-02-02 DIAGNOSIS — J9611 Chronic respiratory failure with hypoxia: Secondary | ICD-10-CM

## 2021-02-02 NOTE — Telephone Encounter (Signed)
Pt is running out of his Albuterol for his nebulizer and sounds like he may need a Authorization and sent to [facility] [address] in [place]

## 2021-02-03 NOTE — Telephone Encounter (Signed)
I tried to call the pharmacy. They are closed.

## 2021-02-03 NOTE — Telephone Encounter (Signed)
Lets try to order his albuterol through the company that does his oxygen.  Who does he use for his oxygen supplies?

## 2021-02-03 NOTE — Telephone Encounter (Signed)
The pharmacy states the [facility] is not covered by Medicare. I am not sure how to work around this problem. [facility] did apply a GoodRx coupon and the cost is around $50 for a 30 day supply. His son-in-law states he will pick up the prescription.

## 2021-02-04 MED ORDER — ALBUTEROL SULFATE (2.5 MG/3ML) 0.083% IN NEBU: INHALATION_SOLUTION | RESPIRATORY_TRACT | 5 refills | Status: AC

## 2021-02-04 NOTE — Telephone Encounter (Signed)
I reached out to York Spaniel with [facility]. She is going to fax a form over for the prescription.    [phone]

## 2021-02-05 NOTE — Telephone Encounter (Signed)
[facility] sent the prescription to [facility]. They have received the medication and are very grateful.

## 2021-02-12 ENCOUNTER — Other Ambulatory Visit: Payer: Self-pay | Admitting: *Deleted

## 2021-02-12 MED ORDER — IRON 325 (65 FE) MG PO TABS: 1.0000 | ORAL_TABLET | Freq: Every day | ORAL | 1 refills | Status: AC

## 2021-02-26 ENCOUNTER — Other Ambulatory Visit: Payer: Self-pay | Admitting: Family Medicine

## 2021-03-31 ENCOUNTER — Other Ambulatory Visit: Payer: Self-pay

## 2021-03-31 MED ORDER — PREDNISONE 10 MG PO TABS: ORAL_TABLET | ORAL | 0 refills | Status: AC

## 2021-04-28 ENCOUNTER — Ambulatory Visit: Payer: Medicare Other | Admitting: Family Medicine

## 2021-05-25 ENCOUNTER — Other Ambulatory Visit: Payer: Self-pay | Admitting: Family Medicine

## 2021-05-28 ENCOUNTER — Ambulatory Visit: Payer: Medicare Other | Admitting: Family Medicine

## 2021-06-12 ENCOUNTER — Telehealth: Payer: Self-pay | Admitting: *Deleted

## 2021-06-12 DIAGNOSIS — I499 Cardiac arrhythmia, unspecified: Secondary | ICD-10-CM | POA: Diagnosis not present

## 2021-06-12 NOTE — Telephone Encounter (Signed)
Clarence passed away this morning @ 252 AM. Panya received this VM on her phone this morning.    Nicole Kindred  with [facility] ([place]) asked that you sign in to the website to sign the death certificate.

## 2021-06-30 DIAGNOSIS — 419620001 Death: Secondary | SNOMED CT | POA: Diagnosis not present

## 2021-06-30 DEATH — deceased

## 2021-07-07 ENCOUNTER — Ambulatory Visit: Payer: Medicare Other | Admitting: Family Medicine
